# Patient Record
Sex: Male | Born: 1955 | Race: White | Hispanic: No | Marital: Married | Smoking: Former smoker
Health system: Southern US, Community
[De-identification: ages and names within clinical notes are randomized; demographics above are authoritative.]

## PROBLEM LIST (undated history)

## (undated) DIAGNOSIS — I509 Heart failure, unspecified: Secondary | ICD-10-CM

## (undated) DIAGNOSIS — K219 Gastro-esophageal reflux disease without esophagitis: Secondary | ICD-10-CM

## (undated) DIAGNOSIS — I2510 Atherosclerotic heart disease of native coronary artery without angina pectoris: Secondary | ICD-10-CM

## (undated) DIAGNOSIS — G709 Myoneural disorder, unspecified: Secondary | ICD-10-CM

## (undated) DIAGNOSIS — R011 Cardiac murmur, unspecified: Secondary | ICD-10-CM

## (undated) DIAGNOSIS — G4730 Sleep apnea, unspecified: Secondary | ICD-10-CM

## (undated) DIAGNOSIS — I10 Essential (primary) hypertension: Secondary | ICD-10-CM

## (undated) HISTORY — PX: TONSILLECTOMY: SUR1361

---

## 2015-03-08 ENCOUNTER — Emergency Department (HOSPITAL_COMMUNITY): Payer: BLUE CROSS/BLUE SHIELD

## 2015-03-08 ENCOUNTER — Inpatient Hospital Stay (HOSPITAL_COMMUNITY)
Admission: EM | Admit: 2015-03-08 | Discharge: 2015-03-11 | DRG: 247 | Disposition: A | Discharge status: Home / Self Care | Payer: BLUE CROSS/BLUE SHIELD | Attending: Student in an Organized Health Care Education/Training Program | Admitting: Student in an Organized Health Care Education/Training Program

## 2015-03-08 ENCOUNTER — Encounter (HOSPITAL_COMMUNITY): Payer: Self-pay | Admitting: Nurse Practitioner

## 2015-03-08 DIAGNOSIS — I5021 Acute systolic (congestive) heart failure: Secondary | ICD-10-CM | POA: Diagnosis present

## 2015-03-08 DIAGNOSIS — Z87891 Personal history of nicotine dependence: Secondary | ICD-10-CM

## 2015-03-08 DIAGNOSIS — I5041 Acute combined systolic (congestive) and diastolic (congestive) heart failure: Principal | ICD-10-CM | POA: Diagnosis present

## 2015-03-08 DIAGNOSIS — I25110 Atherosclerotic heart disease of native coronary artery with unstable angina pectoris: Secondary | ICD-10-CM | POA: Diagnosis present

## 2015-03-08 DIAGNOSIS — I509 Heart failure, unspecified: Secondary | ICD-10-CM

## 2015-03-08 DIAGNOSIS — E785 Hyperlipidemia, unspecified: Secondary | ICD-10-CM | POA: Diagnosis present

## 2015-03-08 DIAGNOSIS — I10 Essential (primary) hypertension: Secondary | ICD-10-CM | POA: Diagnosis present

## 2015-03-08 DIAGNOSIS — R0602 Shortness of breath: Secondary | ICD-10-CM | POA: Diagnosis present

## 2015-03-08 DIAGNOSIS — Z955 Presence of coronary angioplasty implant and graft: Secondary | ICD-10-CM | POA: Insufficient documentation

## 2015-03-08 DIAGNOSIS — Z79899 Other long term (current) drug therapy: Secondary | ICD-10-CM

## 2015-03-08 DIAGNOSIS — Z6834 Body mass index (BMI) 34.0-34.9, adult: Secondary | ICD-10-CM

## 2015-03-08 DIAGNOSIS — E8770 Fluid overload, unspecified: Secondary | ICD-10-CM

## 2015-03-08 DIAGNOSIS — Z8249 Family history of ischemic heart disease and other diseases of the circulatory system: Secondary | ICD-10-CM

## 2015-03-08 DIAGNOSIS — I255 Ischemic cardiomyopathy: Secondary | ICD-10-CM | POA: Diagnosis present

## 2015-03-08 DIAGNOSIS — I493 Ventricular premature depolarization: Secondary | ICD-10-CM | POA: Diagnosis not present

## 2015-03-08 DIAGNOSIS — I2510 Atherosclerotic heart disease of native coronary artery without angina pectoris: Secondary | ICD-10-CM | POA: Diagnosis present

## 2015-03-08 DIAGNOSIS — Z7982 Long term (current) use of aspirin: Secondary | ICD-10-CM

## 2015-03-08 DIAGNOSIS — I200 Unstable angina: Secondary | ICD-10-CM | POA: Diagnosis present

## 2015-03-08 HISTORY — DX: Morbid (severe) obesity due to excess calories: E66.01

## 2015-03-08 HISTORY — DX: Essential (primary) hypertension: I10

## 2015-03-08 LAB — BASIC METABOLIC PANEL
Anion gap: 10 (ref 5–15)
BUN: 15 mg/dL (ref 6–20)
CALCIUM: 9.6 mg/dL (ref 8.9–10.3)
CO2: 26 mmol/L (ref 22–32)
Chloride: 103 mmol/L (ref 101–111)
Creatinine, Ser: 1.04 mg/dL (ref 0.61–1.24)
GFR calc Af Amer: >60 mL/min (ref >60)
GLUCOSE: 147 mg/dL — AB (ref 65–99)
Potassium: 3.6 mmol/L (ref 3.5–5.1)
Sodium: 139 mmol/L (ref 135–145)

## 2015-03-08 LAB — CBC
HEMATOCRIT: 44 % (ref 39.0–52.0)
Hemoglobin: 15.3 g/dL (ref 13.0–17.0)
MCH: 31.6 pg (ref 26.0–34.0)
MCHC: 34.8 g/dL (ref 30.0–36.0)
MCV: 90.9 fL (ref 78.0–100.0)
Platelets: 252 10*3/uL (ref 150–400)
RBC: 4.84 MIL/uL (ref 4.22–5.81)
RDW: 12.2 % (ref 11.5–15.5)
WBC: 7 10*3/uL (ref 4.0–10.5)

## 2015-03-08 LAB — BRAIN NATRIURETIC PEPTIDE: B Natriuretic Peptide: 938.2 pg/mL — ABNORMAL HIGH (ref 0.0–100.0)

## 2015-03-08 LAB — TROPONIN I: Troponin I: 0.04 ng/mL — ABNORMAL HIGH (ref <0.031)

## 2015-03-08 LAB — I-STAT TROPONIN, ED: TROPONIN I, POC: 0.03 ng/mL (ref 0.00–0.08)

## 2015-03-08 MED ORDER — ENOXAPARIN SODIUM 40 MG/0.4ML ~~LOC~~ SOLN
40.0000 mg | SUBCUTANEOUS | Status: DC
  Administered 2015-03-09 – 2015-03-11 (×3): 40 mg via SUBCUTANEOUS
  Filled 2015-03-08 (×4): qty 0.4

## 2015-03-08 MED ORDER — ASPIRIN EC 81 MG PO TBEC
81.0000 mg | DELAYED_RELEASE_TABLET | Freq: Every day | ORAL | Status: DC
  Administered 2015-03-09: 81 mg via ORAL
  Filled 2015-03-08 (×2): qty 1

## 2015-03-08 MED ORDER — FUROSEMIDE 10 MG/ML IJ SOLN
40.0000 mg | Freq: Once | INTRAMUSCULAR | Status: AC
  Administered 2015-03-08: 40 mg via INTRAVENOUS
  Filled 2015-03-08: qty 4

## 2015-03-08 MED ORDER — SODIUM CHLORIDE 0.9 % IJ SOLN
3.0000 mL | Freq: Two times a day (BID) | INTRAMUSCULAR | Status: DC
  Administered 2015-03-08 – 2015-03-10 (×3): 3 mL via INTRAVENOUS

## 2015-03-08 NOTE — H&P (Signed)
Date: 03/08/2015               Patient Name:  William Contreras MRN: [id number]  DOB: 1956/01/30 Age / Sex: 59 y.o., male   PCP: No Pcp Per Patient         Medical Service: Internal Medicine Teaching Service         Attending Physician: Dr. Axel Filler, MD    First Contact: Dr. Burgess Estelle Pager: [phone]  Second Contact: Dr. Joni Reining Pager: [phone]       After Hours (After 5p/  First Contact Pager: [phone]  weekends / holidays): Second Contact Pager: [phone]   Chief Complaint: shortness of breath  History of Present Illness: Mr. William Contreras is a 59 y.o. caucasian male with no significant past medical history who presents to the emergency department for evaluation of progressively worsening shortness of breath.  Patient was seen at an urgent care today for this complaint and was sent to this ED when his EKG revealed an irregular rhythm.  About a year and a half ago, Mr. Hyser was treated as an outpatient for presumed pneumonia/bronhchitis with antibiotics and steroids.  Since about that time he has experienced shortness of breath aggravated by exertion and relieved with rest.  States that the SOB has been progressively worsening over this period of time and he finds it difficult to catch his breath with such activities as mowing the lawn.  Over the past few weeks, he is also experiencing symptoms while lying down in bed and this is relieved by sitting up.  States that he can hear a rumbling in his chest while lying down.  States that he does not sleep with extra pillows.  Also endorses a dry cough that is aggravated by lying down.  He denies any chest pain with his symptomology as well as any palpitations.  Does not endorse any recent weight gain or excess swelling in his extremities.  Family history is positive for his mother having had a heart attack in her 73s.    While in the ED, patient had a negative troponin, BNP of 938, and 2-view chest xray that was read as  having mild pulmonary edema and small bilateral pleural effusions.  He was also given 40mg  IV Lasix.  Meds: No current facility-administered medications for this encounter.   Current Outpatient Prescriptions  Medication Sig Dispense Refill  . aspirin EC 81 MG tablet Take 81 mg by mouth daily.    . calcium citrate (CALCITRATE - DOSED IN MG ELEMENTAL CALCIUM) 950 MG tablet Take 200 mg of elemental calcium by mouth daily.    . Flaxseed, Linseed, (FLAX SEED OIL PO) Take 1 tablet by mouth daily.    . fluticasone (FLONASE) 50 MCG/ACT nasal spray Place 2 sprays into both nostrils daily as needed for allergies or rhinitis.    Marland Kitchen Hawthorne Berry 550 MG CAPS Take 550 mg by mouth daily.    . Magnesium 250 MG TABS Take 1 tablet by mouth at bedtime.    . Multiple Vitamin (MULTIVITAMIN) tablet Take 1 tablet by mouth daily.    . Omega-3 Fatty Acids (FISH OIL) 1000 MG CPDR Take 1,000 mg by mouth daily.      Allergies: Allergies as of 03/08/2015 - Review Complete 03/08/2015  Allergen Reaction Noted  . Penicillins Other (See Comments) 03/08/2015   History reviewed. No pertinent past medical history. Past Surgical History  Procedure Laterality Date  . Tonsillectomy     History reviewed. No pertinent  family history. Social History   Social History  . Marital Status: Married    Spouse Name: N/A  . Number of Children: N/A  . Years of Education: N/A   Occupational History  . Not on file.   Social History Main Topics  . Smoking status: Former Research scientist (life sciences)  . Smokeless tobacco: Not on file  . Alcohol Use: No  . Drug Use: No  . Sexual Activity: Not on file   Other Topics Concern  . Not on file   Social History Narrative  . No narrative on file    Review of Systems: Review of Systems  Constitutional: Negative for fever and chills.  Respiratory: Positive for cough and shortness of breath. Negative for hemoptysis, sputum production and wheezing.   Cardiovascular: Positive for orthopnea and PND.  Negative for chest pain, palpitations, claudication and leg swelling.  Gastrointestinal: Negative for heartburn, nausea, vomiting, abdominal pain, diarrhea and constipation.  Genitourinary: Negative for dysuria and frequency.  Musculoskeletal: Negative for back pain and neck pain.  Neurological: Negative for dizziness, speech change and focal weakness.     Physical Exam: Blood pressure 132/75, pulse 86, temperature 98.3 F (36.8 C), temperature source Oral, resp. rate 20, height 5\' 10"  (1.778 m), weight 238 lb (107.956 kg), SpO2 91 %.  Physical Exam  Constitutional: He appears well-developed and well-nourished. No distress.  HENT:  Head: Normocephalic and atraumatic.  Eyes: EOM are normal.  Neck: Normal range of motion. No JVD present.  Cardiovascular: Normal rate, regular rhythm, normal heart sounds and intact distal pulses.   Pulmonary/Chest: Effort normal. No respiratory distress. He has no wheezes.  + bibasilar crackles  Abdominal: Soft. Bowel sounds are normal. He exhibits no distension. There is no tenderness.  Musculoskeletal: He exhibits edema.  Trace pitting edema b/l lower extremities   Neurological: He is alert. No cranial nerve deficit.  Skin: Skin is warm and dry. He is not diaphoretic.  Psychiatric: He has a normal mood and affect. His behavior is normal.    Lab results: Basic Metabolic Panel:  Recent Labs  03/08/15 1640  NA 139  K 3.6  CL 103  CO2 26  GLUCOSE 147*  BUN 15  CREATININE 1.04  CALCIUM 9.6   CBC:  Recent Labs  03/08/15 1640  WBC 7.0  HGB 15.3  HCT 44.0  MCV 90.9  PLT 252    Imaging results:  Dg Chest 2 View  03/08/2015   CLINICAL DATA:  Shortness of breath with difficulty sleeping over the past few weeks. Irregular heart rhythm. Ex-smoker. Initial encounter.  EXAM: CHEST  2 VIEW  COMPARISON:  None.  FINDINGS: The heart is mildly enlarged. The mediastinal contours are normal. There is vascular congestion with diffuse interstitial and  fissural prominence. There are small bilateral pleural effusions. There is a small metallic foreign density within the presternal soft tissues. Mild thoracic spine degenerative changes are noted. No acute osseous findings seen.  IMPRESSION: Congestive heart failure with mild pulmonary edema and small bilateral pleural effusions.   Electronically Signed   By: Richardean Sale M.D.   On: 03/08/2015 19:28    Other results: EKG: Normal sinus rhythm w/ left axis deviation.  Left ventricular hypertrophy with QRS widening and repolarization abnormality  Assessment & Plan by Problem: Active Problems:   CHF (congestive heart failure)   Shortness of breath  Shortness of breath -likely previously undiagnosed CHF given NYHA class II symptoms of worsening DOE with radiographic findings of pulmonary edema and bilateral effusions with  BNP of 938.  Patient also endorses mild orthopnea and PND.  Do not suspect infectious cause of SOB with patient being afebrile, no leukocytosis, no sputum production, and chronic nature of symptomology.  Also do not suspect pulmonary embolus as source of SOB with no signs/symptoms of DVT and Wells Criteria score for PE of 0. -Lasix 40mg  IV x 1 now -cycle troponins  -ECHO -repeat EKG in morning -BMP in the morning -monitor blood pressure -check Hgb A1c, lipid profile, TSH -continue Aspirin 81mg  daily -I/O's, daily weights -admit to telemetry  Diet: Heart healthy  DVT PPX: Lovenox  Code: Full  Dispo: Disposition is deferred at this time, awaiting improvement of current medical problems. Anticipated discharge in approximately 1-2 day(s).   The patient does not have a current PCP (No Pcp Per Patient) and does need an [facility] hospital follow-up appointment after discharge.  The patient does not have transportation limitations that hinder transportation to clinic appointments.  Signed: Jule Ser, DO 03/08/2015, 10:01 PM

## 2015-03-08 NOTE — ED Notes (Addendum)
He went to urgent care for SOB and difficulty sleeping over past few weeks, [facility] sent him for irregular heart rhythm on EKG. He denies pain. He is A&Ox4, resp e/u

## 2015-03-08 NOTE — ED Notes (Signed)
Report attempted 

## 2015-03-08 NOTE — ED Provider Notes (Signed)
CSN: [id number]     Arrival date & time 03/08/15  1622 History   First MD Initiated Contact with Patient 03/08/15 1755     Chief Complaint  Patient presents with  . Irregular Heart Beat     (Consider location/radiation/quality/duration/timing/severity/associated sxs/prior Treatment) HPI William Contreras is a 59 yo male who presents today with SOB and difficulty sleeping for the past couple of weeks. He was first seen at urgent care, an EKG was performed, it showed an irregular rhythm so he was sent here. He states his SOB has been present for a year and a half, ever since he was diagnosed with pneumonia. He admits to DOE that goes away with rest. He denies CP but does say he feels chest tightness occasionally. He works outdoors in the heat and says that this past week has been very hard for him. He admits to fatigue today.   Patient states that for the past couple of weeks he has been lying down at night and has felt/ heard rumbling/ wheezing in his epigastric region. He says he becomes more short of breath and this is only relieved by sitting up. He admits to occasional cough with these episodes. He sleeps on one pillow. He denies claudication, edema. He denies fever, chills, palpitations, N/V/D.    History reviewed. No pertinent past medical history. Past Surgical History  Procedure Laterality Date  . Tonsillectomy     History reviewed. No pertinent family history. Social History  Substance Use Topics  . Smoking status: Former Research scientist (life sciences)  . Smokeless tobacco: None  . Alcohol Use: No    Review of Systems   Allergies  Penicillins  Home Medications   Prior to Admission medications   Not on File   BP 138/99 mmHg  Pulse 44  Temp(Src) 98.3 F (36.8 C) (Oral)  Resp 16  Ht 5\' 10"  (1.778 m)  Wt 238 lb (107.956 kg)  BMI 34.15 kg/m2  SpO2 96% Physical Exam  Constitutional: He is oriented to person, place, and time. He appears well-developed and well-nourished. No distress.   HENT:  Head: Normocephalic and atraumatic.  Mouth/Throat: Oropharynx is clear and moist.  Eyes: Pupils are equal, round, and reactive to light.  Neck: Normal range of motion. Neck supple.  Cardiovascular: Normal rate, regular rhythm and normal heart sounds.  Exam reveals no gallop and no friction rub.   No murmur heard. Pulmonary/Chest: Effort normal. No respiratory distress. He has rales.  Abdominal: Soft. He exhibits no distension.  Neurological: He is alert and oriented to person, place, and time. He exhibits normal muscle tone. Coordination normal.  Skin: Skin is warm and dry. No erythema.  Psychiatric: He has a normal mood and affect. His behavior is normal.  Nursing note and vitals reviewed.   ED Course  Procedures (including critical care time) Labs Review Labs Reviewed  BASIC METABOLIC PANEL - Abnormal; Notable for the following:    Glucose, Bld 147 (*)    All other components within normal limits  BRAIN NATRIURETIC PEPTIDE - Abnormal; Notable for the following:    B Natriuretic Peptide 938.2 (*)    All other components within normal limits  BASIC METABOLIC PANEL - Abnormal; Notable for the following:    Glucose, Bld 109 (*)    All other components within normal limits  TROPONIN I - Abnormal; Notable for the following:    Troponin I 0.04 (*)    All other components within normal limits  TROPONIN I - Abnormal; Notable for the following:  Troponin I 0.04 (*)    All other components within normal limits  IRON AND TIBC - Abnormal; Notable for the following:    Saturation Ratios 13 (*)    All other components within normal limits  COMPREHENSIVE METABOLIC PANEL - Abnormal; Notable for the following:    Potassium 3.1 (*)    Chloride 100 (*)    Glucose, Bld 109 (*)    ALT 15 (*)    All other components within normal limits  LIPID PANEL - Abnormal; Notable for the following:    Cholesterol 282 (*)    Triglycerides 234 (*)    HDL 35 (*)    VLDL 47 (*)    LDL  Cholesterol 200 (*)    All other components within normal limits  BASIC METABOLIC PANEL - Abnormal; Notable for the following:    Glucose, Bld 101 (*)    Calcium 8.7 (*)    All other components within normal limits  POCT I-STAT 3, VENOUS BLOOD GAS (G3P V) - Abnormal; Notable for the following:    pH, Ven 7.388 (*)    pCO2, Ven 43.6 (*)    Bicarbonate 26.3 (*)    All other components within normal limits  POCT I-STAT 3, VENOUS BLOOD GAS (G3P V) - Abnormal; Notable for the following:    pH, Ven 7.406 (*)    pCO2, Ven 43.5 (*)    Bicarbonate 27.3 (*)    All other components within normal limits  CBC  TSH  HEMOGLOBIN A1C  HIV ANTIBODY (ROUTINE TESTING)  FERRITIN  DRUGS OF ABUSE SCREEN W ALC, ROUTINE URINE  PROTIME-INR  CBC  I-STAT TROPOININ, ED  POCT ACTIVATED CLOTTING TIME    Imaging Review No results found. I have personally reviewed and evaluated these images and lab results as part of my medical decision-making.   EKG Interpretation   Date/Time:  Sunday March 08 2015 16:28:06 EDT Ventricular Rate:  85 PR Interval:  180 QRS Duration: 128 QT Interval:  388 QTC Calculation: 461 R Axis:   -30 Text Interpretation:  Normal sinus rhythm Left axis deviation Left  ventricular hypertrophy with QRS widening and repolarization abnormality  Cannot rule out Septal infarct , age undetermined Abnormal ECG ED  PHYSICIAN INTERPRETATION AVAILABLE IN [facility] Confirmed by TEST,  Record ([id number]) on 03/09/2015 7:07:08 AM       I am concerned about the patient having a cardiac etiology for his chest pain and the fact that he has CHF.  Patient will be admitted to the hospital for further evaluation and care  Dalia Heading, PA-C 03/13/15 [place], MD 03/16/15 [phone]

## 2015-03-08 NOTE — Progress Notes (Signed)
   03/08/15 2241  Vitals  Temp 97.6 F (36.4 C)  Temp Source Oral  BP 107/74 mmHg  BP Location Left Arm  BP Method Automatic  Patient Position (if appropriate) Lying  Pulse Rate 81  Resp 19  Oxygen Therapy  SpO2 91 %  O2 Device Room Air  Height and Weight  Height 5\' 10"  (1.778 m)  Weight 109.408 kg (241 lb 3.2 oz)  Type of Scale Used Standing  Type of Weight Actual  BSA (Calculated - sq m) 2.32 sq meters  BMI (Calculated) 34.7  Weight in (lb) to have BMI = 25 173.9  Admitted pt to rm 3E22 from ED, pt alert and oriented, denied pain at this time, oriented to room, call bell placed within reach, orders carried out. Will monitor.

## 2015-03-09 ENCOUNTER — Encounter (HOSPITAL_COMMUNITY): Payer: Self-pay | Admitting: Nurse Practitioner

## 2015-03-09 ENCOUNTER — Observation Stay (HOSPITAL_COMMUNITY): Payer: BLUE CROSS/BLUE SHIELD

## 2015-03-09 DIAGNOSIS — I509 Heart failure, unspecified: Secondary | ICD-10-CM | POA: Diagnosis not present

## 2015-03-09 DIAGNOSIS — I420 Dilated cardiomyopathy: Secondary | ICD-10-CM

## 2015-03-09 DIAGNOSIS — I10 Essential (primary) hypertension: Secondary | ICD-10-CM | POA: Diagnosis not present

## 2015-03-09 DIAGNOSIS — E785 Hyperlipidemia, unspecified: Secondary | ICD-10-CM | POA: Diagnosis not present

## 2015-03-09 DIAGNOSIS — R0600 Dyspnea, unspecified: Secondary | ICD-10-CM | POA: Diagnosis not present

## 2015-03-09 DIAGNOSIS — I248 Other forms of acute ischemic heart disease: Secondary | ICD-10-CM

## 2015-03-09 DIAGNOSIS — I5021 Acute systolic (congestive) heart failure: Secondary | ICD-10-CM

## 2015-03-09 DIAGNOSIS — R0602 Shortness of breath: Secondary | ICD-10-CM | POA: Diagnosis not present

## 2015-03-09 LAB — BASIC METABOLIC PANEL
Anion gap: 9 (ref 5–15)
BUN: 15 mg/dL (ref 6–20)
CALCIUM: 9 mg/dL (ref 8.9–10.3)
CHLORIDE: 102 mmol/L (ref 101–111)
CO2: 27 mmol/L (ref 22–32)
CREATININE: 1.09 mg/dL (ref 0.61–1.24)
GFR calc Af Amer: >60 mL/min (ref >60)
GFR calc non Af Amer: >60 mL/min (ref >60)
Glucose, Bld: 109 mg/dL — ABNORMAL HIGH (ref 65–99)
Potassium: 3.6 mmol/L (ref 3.5–5.1)
Sodium: 138 mmol/L (ref 135–145)

## 2015-03-09 LAB — TSH: TSH: 0.941 u[IU]/mL (ref 0.350–4.500)

## 2015-03-09 LAB — FERRITIN: Ferritin: 55 ng/mL (ref 24–336)

## 2015-03-09 LAB — TROPONIN I: Troponin I: 0.04 ng/mL — ABNORMAL HIGH (ref <0.031)

## 2015-03-09 LAB — IRON AND TIBC
IRON: 49 ug/dL (ref 45–182)
SATURATION RATIOS: 13 % — AB (ref 17.9–39.5)
TIBC: 389 ug/dL (ref 250–450)
UIBC: 340 ug/dL

## 2015-03-09 MED ORDER — SODIUM CHLORIDE 0.9 % IJ SOLN: 3.0000 mL | INTRAMUSCULAR | Status: DC | PRN

## 2015-03-09 MED ORDER — FUROSEMIDE 10 MG/ML IJ SOLN
40.0000 mg | Freq: Once | INTRAMUSCULAR | Status: AC
  Administered 2015-03-09: 40 mg via INTRAVENOUS
  Filled 2015-03-09: qty 4

## 2015-03-09 MED ORDER — SODIUM CHLORIDE 0.9 % IV SOLN: 250.0000 mL | INTRAVENOUS | Status: DC | PRN

## 2015-03-09 MED ORDER — FUROSEMIDE 10 MG/ML IJ SOLN
40.0000 mg | Freq: Once | INTRAMUSCULAR | Status: DC
Start: 2015-03-09 — End: 2015-03-09

## 2015-03-09 MED ORDER — ATORVASTATIN CALCIUM 80 MG PO TABS
80.0000 mg | ORAL_TABLET | Freq: Every day | ORAL | Status: DC
  Administered 2015-03-09 – 2015-03-11 (×3): 80 mg via ORAL
  Filled 2015-03-09 (×3): qty 1

## 2015-03-09 MED ORDER — SODIUM CHLORIDE 0.9 % IV SOLN
INTRAVENOUS | Status: DC
  Administered 2015-03-10: 10:00:00 via INTRAVENOUS

## 2015-03-09 MED ORDER — LISINOPRIL 5 MG PO TABS
2.5000 mg | ORAL_TABLET | Freq: Every day | ORAL | Status: DC
  Administered 2015-03-09 – 2015-03-11 (×3): 2.5 mg via ORAL
  Filled 2015-03-09 (×4): qty 1

## 2015-03-09 MED ORDER — ASPIRIN 81 MG PO CHEW
81.0000 mg | CHEWABLE_TABLET | ORAL | Status: AC
  Administered 2015-03-10: 81 mg via ORAL
  Filled 2015-03-09: qty 1

## 2015-03-09 MED ORDER — CARVEDILOL 3.125 MG PO TABS
3.1250 mg | ORAL_TABLET | Freq: Two times a day (BID) | ORAL | Status: DC
  Administered 2015-03-09 – 2015-03-10 (×3): 3.125 mg via ORAL
  Filled 2015-03-09 (×3): qty 1

## 2015-03-09 MED ORDER — ZOLPIDEM TARTRATE 5 MG PO TABS
5.0000 mg | ORAL_TABLET | Freq: Once | ORAL | Status: AC
  Administered 2015-03-09: 5 mg via ORAL
  Filled 2015-03-09: qty 1

## 2015-03-09 MED ORDER — SODIUM CHLORIDE 0.9 % IJ SOLN
3.0000 mL | Freq: Two times a day (BID) | INTRAMUSCULAR | Status: DC
  Administered 2015-03-09 – 2015-03-10 (×2): 3 mL via INTRAVENOUS

## 2015-03-09 NOTE — Progress Notes (Signed)
Pt is Stable, no any signs of SOB and distress noted, Inj.Lasix 40mg  provided as per ordered, Echo done today, will continue to monitor the patient.

## 2015-03-09 NOTE — Progress Notes (Signed)
   Subjective: No acute events overnight Patient put out abt 1.5 L diuresed well with lasix Echo today  Objective:  Vitals reviewed Gen: a&o Cv: rrr, no mrg Resp: bibasilar crackles,  Abd,: soft nt nd Extremities: trace edema, has bruised on right lower extremity  Labs, imaging, micro and cultures are reviewed, and the pertinent ones are discussed either in Subjective or in the Assessment and Plan  A/P 59 yo with no pmh came in with 1 month history of DOE and SOB, likely volume overload due to undiagnosed HF  CHF now with volume overload- BNP of 938 -Lasix 40 mg iv bid, put out about 1.5 L -continue aspirin 81 mg -I&O and daily weights -echo shows EF of 30-35% with dilation of atria and diffuse hypokinesis.  -consulted cardiology, will be going for cath to rule out ischemic CAD, rest per their management- they recommended addign low dose beta blocker and acei.  -obtaiend HIV, ferritin, iron, lipid panel and a1c        LOS: 1 day   Burgess Estelle, MD 03/09/2015, 4:50 PM

## 2015-03-09 NOTE — Consult Note (Signed)
CARDIOLOGY CONSULT NOTE   Patient ID: William Contreras MRN: [id number], DOB/AGE: 07-14-1955   Admit date: 03/08/2015 Date of Consult: 03/09/2015   Primary Physician: No PCP Per Patient Primary Cardiologist: new - seen by Liane Comber, MD   Pt. Profile  59 year old male with a history of untreated hypertension and obesity who was admitted on August 28 with a one-month history of progressive dyspnea on exertion and exertional chest pressure and has now been found to have an EF of 30-35% with diffuse hypokinesis.  Problem List  Past Medical History  Diagnosis Date  . Essential hypertension   . Morbid obesity     Past Surgical History  Procedure Laterality Date  . Tonsillectomy       Allergies  Allergies  Allergen Reactions  . Penicillins Other (See Comments)    Lightheadness    HPI   59 year old male without prior cardiac history. He does have a history of hypertension and says that he was on medication at one point but has not been on medication in years. He is currently only taking over-the-counter herbal supplements. He was in his usual state of health until approximately one month ago when he began to experience progressive exertional substernal chest tightness associated with dyspnea. He sometimes notices radiation to shoulders with numbness down his arms. This would often occur at work where performing fairly heavy exertion. Symptoms would last less than 5 minutes and resolve with rest. Within the past 2 weeks, he began to notice symptoms occurring with less and less provocation. Further, he also noticed mild lower extremity edema as well as increasing abdominal girth and early satiety. On the morning of August 28, he developed orthopnea and after reading on the Internet, he presented to an urgent care and was subsequently referred to the emergency department. Here, chest x-ray showed heart failure with mild pulmonary edema and small bilateral pleural effusions. EKG showed no  acute ST or T changes but was suggestive of LVH. BNP was elevated at 938.2. Troponins were mildly elevated at 0.04. TSH was normal. He was admitted and treated with IV Lasix with good response. He is -1.4 L this admission. He says that his baseline weight is somewhere between 235 and 240 and he is currently measured at 241. Symptomatically, he is much improved. An echocardiogram was performed this morning and has shown an EF of 30-35% with diffuse hypokinesis.  Inpatient Medications  . aspirin EC  81 mg Oral Daily  . enoxaparin (LOVENOX) injection  40 mg Subcutaneous Q24H  . furosemide  40 mg Intravenous Once  . sodium chloride  3 mL Intravenous Q12H    Family History Family History  Problem Relation Age of Onset  . Other Father     died young in [place].  Marland Kitchen Heart attack Mother     alive @ 30.  . Other Brother     A & W  . Other Sister     A & W     Social History Social History   Social History  . Marital Status: Married    Spouse Name: N/A  . Number of Children: N/A  . Years of Education: N/A   Occupational History  . Not on file.   Social History Main Topics  . Smoking status: Former Smoker -- 1.00 packs/day for 15 years    Types: Cigarettes  . Smokeless tobacco: Not on file     Comment: quit smoking ~ 20 yrs ago.  . Alcohol Use: No  Comment: prev drank - none in ~ 25 yrs.  . Drug Use: No     Comment: prev smoked MJ, none in 25 yrs.  . Sexual Activity: Yes   Other Topics Concern  . Not on file   Social History Narrative   Pt lives in [place] with his wife and son.  He has 2 step children and another child - all grown and out of the house.     Review of Systems  General:  No chills, fever, night sweats or weight changes.  Cardiovascular:  Positive exertional chest pressure/tightness as well as dyspnea. He has been noticing lower extreme edema as well as increasing early satiety and developed orthopnea on the day of admission. No palpitations, paroxysmal  nocturnal dyspnea. Dermatological: No rash, lesions/masses Respiratory: No cough, positive dyspnea Urologic: No hematuria, dysuria Abdominal:   No nausea, vomiting, diarrhea, bright red blood per rectum, melena, or hematemesis Neurologic:  No visual changes, wkns, changes in mental status. All other systems reviewed and are otherwise negative except as noted above.  Physical Exam  Blood pressure 118/86, pulse 84, temperature 98.4 F (36.9 C), temperature source Oral, resp. rate 20, height 5\' 10"  (1.778 m), weight 241 lb 2.8 oz (109.396 kg), SpO2 95 %.  General: Pleasant, NAD Psych: Normal affect. Neuro: Alert and oriented X 3. Moves all extremities spontaneously. HEENT: Normal  Neck: Supple without bruits. No significant JVD. Lungs:  Resp regular and unlabored, CTA. Heart: RRR no s3, s4, or murmurs. Abdomen: Slightly firm, protuberant, non-tender, BS + x 4.  Extremities: No clubbing, cyanosis or edema. DP/PT/Radials 2+ and equal bilaterally.  Labs   Recent Labs  03/08/15 2303 03/09/15 0430  TROPONINI 0.04* 0.04*   Lab Results  Component Value Date   WBC 7.0 03/08/2015   HGB 15.3 03/08/2015   HCT 44.0 03/08/2015   MCV 90.9 03/08/2015   PLT 252 03/08/2015    Recent Labs Lab 03/09/15 0430  NA 138  K 3.6  CL 102  CO2 27  BUN 15  CREATININE 1.09  CALCIUM 9.0  GLUCOSE 109*   Radiology/Studies  Dg Chest 2 View  03/08/2015   CLINICAL DATA:  Shortness of breath with difficulty sleeping over the past few weeks. Irregular heart rhythm. Ex-smoker. Initial encounter.  EXAM: CHEST  2 VIEW  COMPARISON:  None.  FINDINGS: The heart is mildly enlarged. The mediastinal contours are normal. There is vascular congestion with diffuse interstitial and fissural prominence. There are small bilateral pleural effusions. There is a small metallic foreign density within the presternal soft tissues. Mild thoracic spine degenerative changes are noted. No acute osseous findings seen.   IMPRESSION: Congestive heart failure with mild pulmonary edema and small bilateral pleural effusions.   Electronically Signed   By: Richardean Sale M.D.   On: 03/08/2015 19:28   2D Echocardiogram 8.29.2016  Study Conclusions  - Left ventricle: The cavity size was normal. There was mild   concentric hypertrophy. Systolic function was moderately to   severely reduced. The estimated ejection fraction was in the   range of 30% to 35%. Diffuse hypokinesis. - Aortic valve: A bicuspid morphology cannot be excluded; severely   thickened, moderately calcified leaflets. Valve mobility was   restricted. There was mild stenosis. However, visually aortic   valve restriction appears more significant. Non coronary cusp was   most mobile when compared to other leaflets. Doppler severity of   aortic stenosis may be underestimated secondary to decreased   ejection fraction (EF). There  was mild regurgitation. Valve area   (VTI): 1.86 cm^2. Valve area (Vmax): 1.54 cm^2. Valve area   (Vmean): 1.51 cm^2. - Aorta: The aorta was moderately dilated. Aortic root dimension:   51 mm (ED). - Mitral valve: There was moderate regurgitation. - Left atrium: The atrium was mildly dilated. _____________   ECG: Rsr, 75, lad, lae, lvh w/ repolarization abnormalities.    ASSESSMENT AND PLAN  1. Acute systolic congestive heart failure: Patient presents with a one-month history of progressive dyspnea on exertion and substernal chest pressure with subsequent development of lower extremity edema, increasing abdominal girth, early satiety, and orthopnea on the morning of admission. Chest x-ray admission showed CHF while BNP was elevated. He has been treated with 2 doses of IV Lasix with brisk response and symptomatically improvement. Volume status appears to be pretty good though his abdomen is slightly firm. His neck veins are somewhat difficult to gauge secondary to girth. EF has been found to be depressed at 30-35% with  diffuse hypokinesis on echocardiography today. We discussed the workup of systolic heart failure at length. His wife and sister-in-law were present in the room. We will plan on diagnostic right and left heart cardiac catheterization to rule out ischemic coronary artery disease.  The patient understands that risks include but are not limited to stroke (1 in 1000), death (1 in 29), kidney failure [usually temporary] (1 in 500), bleeding (1 in 200), allergic reaction [possibly serious] (1 in 200), and agrees to proceed.  I suspect he still has some volume on board and he just now received another dose of lasix 40 IV. Add low-dose beta blocker and ACE inhibitor therapy. Could consider switching to entresto at a later date if blood pressure is stable.  2. Unstable angina/elevated troponin: As above, patient has been experiencing exertional substernal chest tightness over the past month in association with dyspnea. Symptoms concerning for angina. Troponins are minimally elevated with a flat trend, likely suggesting demand ischemia. In light of new cardiomyopathy, plan on right left heart diagnostic catheterization tomorrow as above. Continue aspirin. Add low-dose beta blocker. Will add statin for now and check lipids and LFTs in the a.m.  3. Essential hypertension: Patient reports a history of hypertension that has been untreated. Pressures were mildly elevated on admission but have since improved. Follow.  Signed, Murray Hodgkins, NP 03/09/2015, 4:07 PM   The patient was seen, examined and discussed with Carmela Rima, NP and agree as above.  59 year old male who was admitted on 8/28 with 2 month of progressively worsening dyspnea, orthopnea, and was diagnosed with new acute systolic CHF and minimal troponin elevation (0.04 -->0.04), he diuresed well in iv Lasix with significant symptoms improvement, LVEF 30-35% with diffuse hypokinesis. We will schedule a right and left cath for tomorrow. Crea is normal.     Dorothy Spark 03/09/2015

## 2015-03-10 ENCOUNTER — Encounter (HOSPITAL_COMMUNITY)
Admission: EM | Disposition: A | Discharge status: Home / Self Care | Payer: BLUE CROSS/BLUE SHIELD | Source: Home / Self Care | Attending: Student in an Organized Health Care Education/Training Program

## 2015-03-10 DIAGNOSIS — E785 Hyperlipidemia, unspecified: Secondary | ICD-10-CM | POA: Diagnosis present

## 2015-03-10 DIAGNOSIS — I5021 Acute systolic (congestive) heart failure: Secondary | ICD-10-CM | POA: Diagnosis not present

## 2015-03-10 DIAGNOSIS — I5041 Acute combined systolic (congestive) and diastolic (congestive) heart failure: Secondary | ICD-10-CM | POA: Diagnosis present

## 2015-03-10 DIAGNOSIS — I200 Unstable angina: Secondary | ICD-10-CM | POA: Diagnosis present

## 2015-03-10 DIAGNOSIS — Z87891 Personal history of nicotine dependence: Secondary | ICD-10-CM | POA: Diagnosis not present

## 2015-03-10 DIAGNOSIS — I2510 Atherosclerotic heart disease of native coronary artery without angina pectoris: Secondary | ICD-10-CM | POA: Diagnosis present

## 2015-03-10 DIAGNOSIS — I25110 Atherosclerotic heart disease of native coronary artery with unstable angina pectoris: Secondary | ICD-10-CM

## 2015-03-10 DIAGNOSIS — I493 Ventricular premature depolarization: Secondary | ICD-10-CM | POA: Diagnosis not present

## 2015-03-10 DIAGNOSIS — I10 Essential (primary) hypertension: Secondary | ICD-10-CM | POA: Diagnosis present

## 2015-03-10 DIAGNOSIS — Z79899 Other long term (current) drug therapy: Secondary | ICD-10-CM | POA: Diagnosis not present

## 2015-03-10 DIAGNOSIS — R0602 Shortness of breath: Secondary | ICD-10-CM | POA: Diagnosis present

## 2015-03-10 DIAGNOSIS — Z6834 Body mass index (BMI) 34.0-34.9, adult: Secondary | ICD-10-CM | POA: Diagnosis not present

## 2015-03-10 DIAGNOSIS — Z7982 Long term (current) use of aspirin: Secondary | ICD-10-CM | POA: Diagnosis not present

## 2015-03-10 DIAGNOSIS — I255 Ischemic cardiomyopathy: Secondary | ICD-10-CM | POA: Diagnosis present

## 2015-03-10 DIAGNOSIS — Z8249 Family history of ischemic heart disease and other diseases of the circulatory system: Secondary | ICD-10-CM | POA: Diagnosis not present

## 2015-03-10 HISTORY — PX: CARDIAC CATHETERIZATION: SHX172

## 2015-03-10 LAB — POCT I-STAT 3, VENOUS BLOOD GAS (G3P V)
Acid-Base Excess: 1 mmol/L (ref 0.0–2.0)
Acid-Base Excess: 2 mmol/L (ref 0.0–2.0)
BICARBONATE: 26.3 meq/L — AB (ref 20.0–24.0)
Bicarbonate: 27.3 mEq/L — ABNORMAL HIGH (ref 20.0–24.0)
O2 SAT: 66 %
O2 Saturation: 66 %
PCO2 VEN: 43.5 mmHg — AB (ref 45.0–50.0)
PCO2 VEN: 43.6 mmHg — AB (ref 45.0–50.0)
PH VEN: 7.388 — AB (ref 7.250–7.300)
PO2 VEN: 34 mmHg (ref 30.0–45.0)
TCO2: 28 mmol/L (ref 0–100)
TCO2: 29 mmol/L (ref 0–100)
pH, Ven: 7.406 — ABNORMAL HIGH (ref 7.250–7.300)
pO2, Ven: 35 mmHg (ref 30.0–45.0)

## 2015-03-10 LAB — HEMOGLOBIN A1C
HEMOGLOBIN A1C: 5.6 % (ref 4.8–5.6)
Mean Plasma Glucose: 114 mg/dL

## 2015-03-10 LAB — COMPREHENSIVE METABOLIC PANEL
ALBUMIN: 3.5 g/dL (ref 3.5–5.0)
ALK PHOS: 41 U/L (ref 38–126)
ALT: 15 U/L — AB (ref 17–63)
AST: 15 U/L (ref 15–41)
Anion gap: 10 (ref 5–15)
BUN: 19 mg/dL (ref 6–20)
CALCIUM: 8.9 mg/dL (ref 8.9–10.3)
CO2: 26 mmol/L (ref 22–32)
CREATININE: 0.95 mg/dL (ref 0.61–1.24)
Chloride: 100 mmol/L — ABNORMAL LOW (ref 101–111)
GFR calc Af Amer: >60 mL/min (ref >60)
GFR calc non Af Amer: >60 mL/min (ref >60)
GLUCOSE: 109 mg/dL — AB (ref 65–99)
Potassium: 3.1 mmol/L — ABNORMAL LOW (ref 3.5–5.1)
Sodium: 136 mmol/L (ref 135–145)
Total Bilirubin: 1.1 mg/dL (ref 0.3–1.2)
Total Protein: 6.6 g/dL (ref 6.5–8.1)

## 2015-03-10 LAB — LIPID PANEL
CHOL/HDL RATIO: 8.1 ratio
CHOLESTEROL: 282 mg/dL — AB (ref 0–200)
HDL: 35 mg/dL — ABNORMAL LOW (ref >40)
LDL Cholesterol: 200 mg/dL — ABNORMAL HIGH (ref 0–99)
Triglycerides: 234 mg/dL — ABNORMAL HIGH (ref <150)
VLDL: 47 mg/dL — AB (ref 0–40)

## 2015-03-10 LAB — HIV ANTIBODY (ROUTINE TESTING W REFLEX): HIV Screen 4th Generation wRfx: NONREACTIVE

## 2015-03-10 LAB — URINE DRUGS OF ABUSE SCREEN W ALC, ROUTINE (REF LAB)
Amphetamines, Urine: NEGATIVE ng/mL
Barbiturate, Ur: NEGATIVE ng/mL
Benzodiazepine Quant, Ur: NEGATIVE ng/mL
CANNABINOID QUANT UR: NEGATIVE ng/mL
Cocaine (Metab.): NEGATIVE ng/mL
ETHANOL U, QUAN: NEGATIVE %
METHADONE SCREEN, URINE: NEGATIVE ng/mL
OPIATE QUANT UR: NEGATIVE ng/mL
PHENCYCLIDINE, UR: NEGATIVE ng/mL
PROPOXYPHENE, URINE: NEGATIVE ng/mL

## 2015-03-10 LAB — PROTIME-INR
INR: 1.07 (ref 0.00–1.49)
Prothrombin Time: 14.1 seconds (ref 11.6–15.2)

## 2015-03-10 LAB — POCT ACTIVATED CLOTTING TIME: ACTIVATED CLOTTING TIME: 466 s

## 2015-03-10 SURGERY — RIGHT/LEFT HEART CATH AND CORONARY ANGIOGRAPHY

## 2015-03-10 MED ORDER — HEPARIN SODIUM (PORCINE) 1000 UNIT/ML IJ SOLN
INTRAMUSCULAR | Status: DC | PRN
Start: 2015-03-10 — End: 2015-03-10
  Administered 2015-03-10: 5000 [IU] via INTRAVENOUS

## 2015-03-10 MED ORDER — TICAGRELOR 90 MG PO TABS
ORAL_TABLET | ORAL | Status: DC | PRN
  Administered 2015-03-10: 180 mg via ORAL

## 2015-03-10 MED ORDER — SODIUM CHLORIDE 0.9 % WEIGHT BASED INFUSION
1.0000 mL/kg/h | INTRAVENOUS | Status: AC
Start: 2015-03-10 — End: 2015-03-10
  Administered 2015-03-10: 1 mL/kg/h via INTRAVENOUS

## 2015-03-10 MED ORDER — IOHEXOL 350 MG/ML SOLN
INTRAVENOUS | Status: DC | PRN
  Administered 2015-03-10: 330 mL via INTRAVENOUS

## 2015-03-10 MED ORDER — VERAPAMIL HCL 2.5 MG/ML IV SOLN
INTRAVENOUS | Status: AC
  Filled 2015-03-10: qty 2

## 2015-03-10 MED ORDER — HEPARIN SODIUM (PORCINE) 1000 UNIT/ML IJ SOLN
INTRAMUSCULAR | Status: AC
  Filled 2015-03-10: qty 1

## 2015-03-10 MED ORDER — SODIUM CHLORIDE 0.9 % IV SOLN: 250.0000 mL | INTRAVENOUS | Status: DC | PRN

## 2015-03-10 MED ORDER — NITROGLYCERIN 1 MG/10 ML FOR IR/CATH LAB
INTRA_ARTERIAL | Status: AC
  Filled 2015-03-10: qty 10

## 2015-03-10 MED ORDER — SODIUM CHLORIDE 0.9 % IJ SOLN: 3.0000 mL | Freq: Two times a day (BID) | INTRAMUSCULAR | Status: DC

## 2015-03-10 MED ORDER — MIDAZOLAM HCL 2 MG/2ML IJ SOLN
INTRAMUSCULAR | Status: DC | PRN
Start: 2015-03-10 — End: 2015-03-10
  Administered 2015-03-10 (×2): 1 mg via INTRAVENOUS

## 2015-03-10 MED ORDER — BIVALIRUDIN BOLUS VIA INFUSION - CUPID
INTRAVENOUS | Status: DC | PRN
  Administered 2015-03-10: 79.875 mg via INTRAVENOUS

## 2015-03-10 MED ORDER — LIDOCAINE HCL (PF) 1 % IJ SOLN
INTRAMUSCULAR | Status: AC
  Filled 2015-03-10: qty 30

## 2015-03-10 MED ORDER — TICAGRELOR 90 MG PO TABS
ORAL_TABLET | ORAL | Status: AC
  Filled 2015-03-10: qty 1

## 2015-03-10 MED ORDER — FENTANYL CITRATE (PF) 100 MCG/2ML IJ SOLN
INTRAMUSCULAR | Status: DC | PRN
  Administered 2015-03-10 (×3): 25 ug via INTRAVENOUS

## 2015-03-10 MED ORDER — SODIUM CHLORIDE 0.9 % IV SOLN
1.7500 mg/kg/h | Freq: Once | INTRAVENOUS | Status: DC
  Filled 2015-03-10: qty 250

## 2015-03-10 MED ORDER — ASPIRIN 81 MG PO CHEW
81.0000 mg | CHEWABLE_TABLET | Freq: Every day | ORAL | Status: DC
  Administered 2015-03-11: 81 mg via ORAL
  Filled 2015-03-10: qty 1

## 2015-03-10 MED ORDER — ONDANSETRON HCL 4 MG/2ML IJ SOLN: 4.0000 mg | Freq: Four times a day (QID) | INTRAMUSCULAR | Status: DC | PRN

## 2015-03-10 MED ORDER — POTASSIUM CHLORIDE CRYS ER 20 MEQ PO TBCR: 40.0000 meq | EXTENDED_RELEASE_TABLET | Freq: Once | ORAL | Status: DC

## 2015-03-10 MED ORDER — TICAGRELOR 90 MG PO TABS
90.0000 mg | ORAL_TABLET | Freq: Two times a day (BID) | ORAL | Status: DC
  Administered 2015-03-11 (×2): 90 mg via ORAL
  Filled 2015-03-10 (×2): qty 1

## 2015-03-10 MED ORDER — BIVALIRUDIN 250 MG IV SOLR
INTRAVENOUS | Status: AC
  Filled 2015-03-10: qty 250

## 2015-03-10 MED ORDER — ACETAMINOPHEN 325 MG PO TABS: 650.0000 mg | ORAL_TABLET | ORAL | Status: DC | PRN

## 2015-03-10 MED ORDER — LIDOCAINE HCL (PF) 1 % IJ SOLN
INTRAMUSCULAR | Status: DC | PRN
  Administered 2015-03-10: 14:00:00

## 2015-03-10 MED ORDER — BIVALIRUDIN 250 MG IV SOLR
250.0000 mg | INTRAVENOUS | Status: DC | PRN
  Administered 2015-03-10: 1.75 mg/kg/h via INTRAVENOUS

## 2015-03-10 MED ORDER — POTASSIUM CHLORIDE CRYS ER 20 MEQ PO TBCR
40.0000 meq | EXTENDED_RELEASE_TABLET | ORAL | Status: AC
  Administered 2015-03-10: 40 meq via ORAL
  Filled 2015-03-10 (×2): qty 2

## 2015-03-10 MED ORDER — SODIUM CHLORIDE 0.9 % IJ SOLN: 3.0000 mL | INTRAMUSCULAR | Status: DC | PRN

## 2015-03-10 MED ORDER — FENTANYL CITRATE (PF) 100 MCG/2ML IJ SOLN
INTRAMUSCULAR | Status: AC
  Filled 2015-03-10: qty 4

## 2015-03-10 MED ORDER — MORPHINE SULFATE (PF) 2 MG/ML IV SOLN
2.0000 mg | INTRAVENOUS | Status: DC | PRN
  Administered 2015-03-10: 2 mg via INTRAVENOUS
  Filled 2015-03-10: qty 1

## 2015-03-10 MED ORDER — ENALAPRILAT 1.25 MG/ML IV SOLN
INTRAVENOUS | Status: AC
  Filled 2015-03-10: qty 1

## 2015-03-10 MED ORDER — HEPARIN (PORCINE) IN NACL 2-0.9 UNIT/ML-% IJ SOLN
INTRAMUSCULAR | Status: AC
  Filled 2015-03-10: qty 1000

## 2015-03-10 MED ORDER — MIDAZOLAM HCL 2 MG/2ML IJ SOLN
INTRAMUSCULAR | Status: AC
  Filled 2015-03-10: qty 4

## 2015-03-10 SURGICAL SUPPLY — 34 items
BALLN EMERGE MR 2.5X12 (BALLOONS) ×2
BALLN EMERGE MR 3.0X20 (BALLOONS) ×2
BALLN EMERGE MR PUSH 1.20X20 (BALLOONS) ×2 IMPLANT
BALLN ~~LOC~~ EMERGE MR 4.5X15 (BALLOONS) ×2
BALLOON EMERGE MR 2.5X12 (BALLOONS) ×1 IMPLANT
BALLOON EMERGE MR 3.0X20 (BALLOONS) ×1 IMPLANT
BALLOON ~~LOC~~ EMERGE MR 4.5X15 (BALLOONS) ×1 IMPLANT
CATH BALLN WEDGE 5F 110CM (CATHETERS) ×2 IMPLANT
CATH INFINITI 5 FR JL3.5 (CATHETERS) ×2 IMPLANT
CATH INFINITI 5FR ANG PIGTAIL (CATHETERS) ×2 IMPLANT
CATH INFINITI 5FR JL4 (CATHETERS) ×2 IMPLANT
CATH INFINITI JR4 5F (CATHETERS) ×2 IMPLANT
CATH VISTA GUIDE 6FR JR4 (CATHETERS) ×2 IMPLANT
CATH VISTA GUIDE 6FR XBLAD3.5 (CATHETERS) ×2 IMPLANT
DEVICE RAD COMP TR BAND LRG (VASCULAR PRODUCTS) ×2 IMPLANT
GLIDESHEATH SLEND SS 6F .021 (SHEATH) ×2 IMPLANT
GUIDELINER 6F (CATHETERS) ×2 IMPLANT
KIT ENCORE 26 ADVANTAGE (KITS) ×2 IMPLANT
KIT HEART LEFT (KITS) ×2 IMPLANT
KIT HEART RIGHT NAMIC (KITS) ×2 IMPLANT
PACK CARDIAC CATHETERIZATION (CUSTOM PROCEDURE TRAY) ×2 IMPLANT
SET INTRODUCER MICROPUNCT 5F (INTRODUCER) ×2 IMPLANT
SHEATH FAST CATH BRACH 5F 5CM (SHEATH) ×2 IMPLANT
STENT PROMUS PREM MR 2.75X20 (Permanent Stent) ×2 IMPLANT
STENT PROMUS PREM MR 4.0X38 (Permanent Stent) ×2 IMPLANT
SYR MEDRAD MARK V 150ML (SYRINGE) ×2 IMPLANT
TRANSDUCER W/STOPCOCK (MISCELLANEOUS) ×2 IMPLANT
TUBING CIL FLEX 10 FLL-RA (TUBING) ×2 IMPLANT
WIRE HI TORQ BMW 190CM (WIRE) ×2 IMPLANT
WIRE HI TORQ VERSACORE-J 145CM (WIRE) ×2 IMPLANT
WIRE LUGE 182CM (WIRE) ×2 IMPLANT
WIRE MICROINTRODUCER 60CM (WIRE) ×2 IMPLANT
WIRE PT2 MS 185 (WIRE) ×2 IMPLANT
WIRE SAFE-T 1.5MM-J .035X260CM (WIRE) ×2 IMPLANT

## 2015-03-10 NOTE — H&P (View-Only) (Signed)
 CARDIOLOGY CONSULT NOTE   Patient ID: William Contreras MRN: [id number], DOB/AGE: 10/07/1955   Admit date: 03/08/2015 Date of Consult: 03/09/2015   Primary Physician: No PCP Per Patient Primary Cardiologist: new - seen by K. Aastha Dayley, MD   Pt. Profile  59-year-old male with a history of untreated hypertension and obesity who was admitted on August 28 with a one-month history of progressive dyspnea on exertion and exertional chest pressure and has now been found to have an EF of 30-35% with diffuse hypokinesis.  Problem List  Past Medical History  Diagnosis Date  . Essential hypertension   . Morbid obesity     Past Surgical History  Procedure Laterality Date  . Tonsillectomy       Allergies  Allergies  Allergen Reactions  . Penicillins Other (See Comments)    Lightheadness    HPI   59-year-old male without prior cardiac history. He does have a history of hypertension and says that he was on medication at one point but has not been on medication in years. He is currently only taking over-the-counter herbal supplements. He was in his usual state of health until approximately one month ago when he began to experience progressive exertional substernal chest tightness associated with dyspnea. He sometimes notices radiation to shoulders with numbness down his arms. This would often occur at work where performing fairly heavy exertion. Symptoms would last less than 5 minutes and resolve with rest. Within the past 2 weeks, he began to notice symptoms occurring with less and less provocation. Further, he also noticed mild lower extremity edema as well as increasing abdominal girth and early satiety. On the morning of August 28, he developed orthopnea and after reading on the Internet, he presented to an urgent care and was subsequently referred to the emergency department. Here, chest x-ray showed heart failure with mild pulmonary edema and small bilateral pleural effusions. EKG showed no  acute ST or T changes but was suggestive of LVH. BNP was elevated at 938.2. Troponins were mildly elevated at 0.04. TSH was normal. He was admitted and treated with IV Lasix with good response. He is -1.4 L this admission. He says that his baseline weight is somewhere between 235 and 240 and he is currently measured at 241. Symptomatically, he is much improved. An echocardiogram was performed this morning and has shown an EF of 30-35% with diffuse hypokinesis.  Inpatient Medications  . aspirin EC  81 mg Oral Daily  . enoxaparin (LOVENOX) injection  40 mg Subcutaneous Q24H  . furosemide  40 mg Intravenous Once  . sodium chloride  3 mL Intravenous Q12H    Family History Family History  Problem Relation Age of Onset  . Other Father     died young in [place].  . Heart attack Mother     alive @ 87.  . Other Brother     A & W  . Other Sister     A & W     Social History Social History   Social History  . Marital Status: Married    Spouse Name: N/A  . Number of Children: N/A  . Years of Education: N/A   Occupational History  . Not on file.   Social History Main Topics  . Smoking status: Former Smoker -- 1.00 packs/day for 15 years    Types: Cigarettes  . Smokeless tobacco: Not on file     Comment: quit smoking ~ 20 yrs ago.  . Alcohol Use: No       Comment: prev drank - none in ~ 25 yrs.  . Drug Use: No     Comment: prev smoked MJ, none in 25 yrs.  . Sexual Activity: Yes   Other Topics Concern  . Not on file   Social History Narrative   Pt lives in [place] with his wife and son.  He has 2 step children and another child - all grown and out of the house.     Review of Systems  General:  No chills, fever, night sweats or weight changes.  Cardiovascular:  Positive exertional chest pressure/tightness as well as dyspnea. He has been noticing lower extreme edema as well as increasing early satiety and developed orthopnea on the day of admission. No palpitations, paroxysmal  nocturnal dyspnea. Dermatological: No rash, lesions/masses Respiratory: No cough, positive dyspnea Urologic: No hematuria, dysuria Abdominal:   No nausea, vomiting, diarrhea, bright red blood per rectum, melena, or hematemesis Neurologic:  No visual changes, wkns, changes in mental status. All other systems reviewed and are otherwise negative except as noted above.  Physical Exam  Blood pressure 118/86, pulse 84, temperature 98.4 F (36.9 C), temperature source Oral, resp. rate 20, height 5' 10" (1.778 m), weight 241 lb 2.8 oz (109.396 kg), SpO2 95 %.  General: Pleasant, NAD Psych: Normal affect. Neuro: Alert and oriented X 3. Moves all extremities spontaneously. HEENT: Normal  Neck: Supple without bruits. No significant JVD. Lungs:  Resp regular and unlabored, CTA. Heart: RRR no s3, s4, or murmurs. Abdomen: Slightly firm, protuberant, non-tender, BS + x 4.  Extremities: No clubbing, cyanosis or edema. DP/PT/Radials 2+ and equal bilaterally.  Labs   Recent Labs  03/08/15 2303 03/09/15 0430  TROPONINI 0.04* 0.04*   Lab Results  Component Value Date   WBC 7.0 03/08/2015   HGB 15.3 03/08/2015   HCT 44.0 03/08/2015   MCV 90.9 03/08/2015   PLT 252 03/08/2015    Recent Labs Lab 03/09/15 0430  NA 138  K 3.6  CL 102  CO2 27  BUN 15  CREATININE 1.09  CALCIUM 9.0  GLUCOSE 109*   Radiology/Studies  Dg Chest 2 View  03/08/2015   CLINICAL DATA:  Shortness of breath with difficulty sleeping over the past few weeks. Irregular heart rhythm. Ex-smoker. Initial encounter.  EXAM: CHEST  2 VIEW  COMPARISON:  None.  FINDINGS: The heart is mildly enlarged. The mediastinal contours are normal. There is vascular congestion with diffuse interstitial and fissural prominence. There are small bilateral pleural effusions. There is a small metallic foreign density within the presternal soft tissues. Mild thoracic spine degenerative changes are noted. No acute osseous findings seen.   IMPRESSION: Congestive heart failure with mild pulmonary edema and small bilateral pleural effusions.   Electronically Signed   By: William  Veazey M.D.   On: 03/08/2015 19:28   2D Echocardiogram 8.29.2016  Study Conclusions  - Left ventricle: The cavity size was normal. There was mild   concentric hypertrophy. Systolic function was moderately to   severely reduced. The estimated ejection fraction was in the   range of 30% to 35%. Diffuse hypokinesis. - Aortic valve: A bicuspid morphology cannot be excluded; severely   thickened, moderately calcified leaflets. Valve mobility was   restricted. There was mild stenosis. However, visually aortic   valve restriction appears more significant. Non coronary cusp was   most mobile when compared to other leaflets. Doppler severity of   aortic stenosis may be underestimated secondary to decreased   ejection fraction (EF). There   was mild regurgitation. Valve area   (VTI): 1.86 cm^2. Valve area (Vmax): 1.54 cm^2. Valve area   (Vmean): 1.51 cm^2. - Aorta: The aorta was moderately dilated. Aortic root dimension:   51 mm (ED). - Mitral valve: There was moderate regurgitation. - Left atrium: The atrium was mildly dilated. _____________   ECG: Rsr, 75, lad, lae, lvh w/ repolarization abnormalities.    ASSESSMENT AND PLAN  1. Acute systolic congestive heart failure: Patient presents with a one-month history of progressive dyspnea on exertion and substernal chest pressure with subsequent development of lower extremity edema, increasing abdominal girth, early satiety, and orthopnea on the morning of admission. Chest x-ray admission showed CHF while BNP was elevated. He has been treated with 2 doses of IV Lasix with brisk response and symptomatically improvement. Volume status appears to be pretty good though his abdomen is slightly firm. His neck veins are somewhat difficult to gauge secondary to girth. EF has been found to be depressed at 30-35% with  diffuse hypokinesis on echocardiography today. We discussed the workup of systolic heart failure at length. His wife and sister-in-law were present in the room. We will plan on diagnostic right and left heart cardiac catheterization to rule out ischemic coronary artery disease.  The patient understands that risks include but are not limited to stroke (1 in 1000), death (1 in 1000), kidney failure [usually temporary] (1 in 500), bleeding (1 in 200), allergic reaction [possibly serious] (1 in 200), and agrees to proceed.  I suspect he still has some volume on board and he just now received another dose of lasix 40 IV. Add low-dose beta blocker and ACE inhibitor therapy. Could consider switching to entresto at a later date if blood pressure is stable.  2. Unstable angina/elevated troponin: As above, patient has been experiencing exertional substernal chest tightness over the past month in association with dyspnea. Symptoms concerning for angina. Troponins are minimally elevated with a flat trend, likely suggesting demand ischemia. In light of new cardiomyopathy, plan on right left heart diagnostic catheterization tomorrow as above. Continue aspirin. Add low-dose beta blocker. Will add statin for now and check lipids and LFTs in the a.m.  3. Essential hypertension: Patient reports a history of hypertension that has been untreated. Pressures were mildly elevated on admission but have since improved. Follow.  Signed, Christopher Berge, NP 03/09/2015, 4:07 PM   The patient was seen, examined and discussed with Chris Berger, NP and agree as above.  59-year-old male who was admitted on 8/28 with 2 month of progressively worsening dyspnea, orthopnea, and was diagnosed with new acute systolic CHF and minimal troponin elevation (0.04 -->0.04), he diuresed well in iv Lasix with significant symptoms improvement, LVEF 30-35% with diffuse hypokinesis. We will schedule a right and left cath for tomorrow. Crea is normal.     Annais Crafts H 03/09/2015  

## 2015-03-10 NOTE — Progress Notes (Signed)
TR BAND REMOVAL  LOCATION:  right radial  DEFLATED PER PROTOCOL:  Yes.    TIME BAND OFF / DRESSING APPLIED:   1830   SITE UPON ARRIVAL:   Level 0  SITE AFTER BAND REMOVAL:  Level 0  REVERSE ALLEN'S TEST:    positive  CIRCULATION SENSATION AND MOVEMENT:  Within Normal Limits  Yes.    COMMENTS:    

## 2015-03-10 NOTE — Interval H&P Note (Signed)
Cath Lab Visit (complete for each Cath Lab visit)  Clinical Evaluation Leading to the Procedure:   ACS: No.  Non-ACS:    Anginal Classification: CCS III  Anti-ischemic medical therapy: Minimal Therapy (1 class of medications)  Non-Invasive Test Results: No non-invasive testing performed  Prior CABG: No previous CABG      History and Physical Interval Note:  03/10/2015 10:37 AM  William Contreras  has presented today for surgery, with the diagnosis of chf  The various methods of treatment have been discussed with the patient and family. After consideration of risks, benefits and other options for treatment, the patient has consented to  Procedure(s): Right/Left Heart Cath and Coronary Angiography (N/A) as a surgical intervention .  The patient's history has been reviewed, patient examined, no change in status, stable for surgery.  I have reviewed the patient's chart and labs.  Questions were answered to the patient's satisfaction.     William Contreras [employer]

## 2015-03-10 NOTE — Progress Notes (Signed)
CM spoke with pt and wife regarding Brilinta and provided booklet with 30 day free card and copay card. Benefits check in process.     CM called [facility] and confirmed medication is in stock, pt/wife made aware. CM will f/u with benefits check in am with pt. Whitman Hero RN,BSN,CM [phone]

## 2015-03-10 NOTE — Progress Notes (Signed)
Heart Failure Navigator Consult Note  Presentation: William Contreras is a 59 year old male with a history of untreated hypertension and obesity who was admitted on August 28 with a one-month history of progressive dyspnea on exertion and exertional chest pressure and has now been found to have an EF of 30-35% with diffuse hypokinesis.  Past Medical History  Diagnosis Date  . Essential hypertension   . Morbid obesity     Social History   Social History  . Marital Status: Married    Spouse Name: N/A  . Number of Children: N/A  . Years of Education: N/A   Social History Main Topics  . Smoking status: Former Smoker -- 1.00 packs/day for 15 years    Types: Cigarettes  . Smokeless tobacco: None     Comment: quit smoking ~ 20 yrs ago.  . Alcohol Use: No     Comment: prev drank - none in ~ 25 yrs.  . Drug Use: No     Comment: prev smoked MJ, none in 25 yrs.  . Sexual Activity: Yes   Other Topics Concern  . None   Social History Narrative   Pt lives in [place] with his wife and son.  He has 2 step children and another child - all grown and out of the house.    ECHO:Study Conclusions--03/09/15  - Left ventricle: The cavity size was normal. There was mild concentric hypertrophy. Systolic function was moderately to severely reduced. The estimated ejection fraction was in the range of 30% to 35%. Diffuse hypokinesis. - Aortic valve: A bicuspid morphology cannot be excluded; severely thickened, moderately calcified leaflets. Valve mobility was restricted. There was mild stenosis. However, visually aortic valve restriction appears more significant. Non coronary cusp was most mobile when compared to other leaflets. Doppler severity of aortic stenosis may be underestimated secondary to decreased ejection fraction (EF). There was mild regurgitation. Valve area (VTI): 1.86 cm^2. Valve area (Vmax): 1.54 cm^2. Valve area (Vmean): 1.51 cm^2. - Aorta: The aorta  was moderately dilated. Aortic root dimension: 51 mm (ED). - Mitral valve: There was moderate regurgitation. - Left atrium: The atrium was mildly dilated.  Transthoracic echocardiography. M-mode, complete 2D, spectral Doppler, and color Doppler. Birthdate: Patient birthdate: 1956/07/02. Age: Patient is 59 yr old. Sex: Gender: male. BMI: 34.6 kg/m^2. Blood pressure:   118/86 Patient status: Inpatient. Study date: Study date: 03/09/2015. Study time: 11:10 AM. Location: Bedside.  BNP    Component Value Date/Time   BNP 938.2* 03/08/2015 1640    ProBNP No results found for: PROBNP   Education Assessment and Provision:  Detailed education and instructions provided on heart failure disease management including the following:  Signs and symptoms of Heart Failure When to call the physician Importance of daily weights Low sodium diet Fluid restriction Medication management Anticipated future follow-up appointments  Patient education given on each of the above topics.  Patient acknowledges understanding and acceptance of all instructions.  I spoke with patient and his wife regarding his new HF diagnosis.  He tells me that he has never been in the hospital or "sick" before.  He has a scale at home and agrees to weigh daily.  We discussed the importance of daily weights and how they relate to the signs and symptoms of HF as well as when to call the physician.  I reinforced a low sodium diet and high sodium foods to avoid.  He has not previously "been careful" with diet at home.  He does not believe that getting  or taking prescribed medications will be an issue.   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  He lives with his wife and son in [place].  He will likely follow with [facility] after discharge and has requested assistance with getting a PCP.  I will collaborate with Care Manager regarding PCP local to them.  Education  Materials:  "Living Better With Heart Failure" Booklet, Daily Weight Tracker Tool and Heart Failure Educational Video.   High Risk Criteria for Readmission and/or Poor Patient Outcomes:   EF <30%- 30-35%  2 or more admissions in 6 months- No new HF  Difficult social situation- No lives with wife  Demonstrates medication noncompliance- No    Barriers of Care:    Knowledge (new diagnosis) and compliance  Discharge Planning:   Plans to return to home alone

## 2015-03-10 NOTE — Progress Notes (Signed)
Patient Information: 1. Suspected CAD (No Prior PCI, No Prior CABG, and No Prior Angiogram Showing >= 50% Angiographic Stenosis) 2. No Prior Noninvasive Testing 3. Symptomatic 4. High Pretest Probability A (7) Indication: 10; 

## 2015-03-10 NOTE — Progress Notes (Signed)
Right brachial veinous sheath removed at 1650, manual pressure applied for 10 minutes.  Site level zero; no complications.

## 2015-03-10 NOTE — Progress Notes (Signed)
   Subjective:  Cath today, anxious but was ready for it.  Objective:  Vitals reviewed Gen: a&o Cv: rrr, no mrg Resp: bibasilar crackles,  Abd,: soft nt nd Extremities: trace edema, has bruised on right lower extremity  Output- about 2 L  Labs, imaging, micro and cultures are reviewed, and the pertinent ones are discussed either in Subjective or in the Assessment and Plan  A/P 59 yo with no pmh came in with 1 month history of DOE and SOB, likely volume overload due to acute diastolic heart failure.   HFrEF: BNP 900s, with EF of 30-35% and dilated atria -has put out 2 L over past 24 hours, will reduce his lasix today, continue -I&O and daily weights -continue aspirin 81 mg, beta blocker, acei and atorva 80 mg -HIV antibody nonreactive, ferritin and iron WNL, uds neg, tsh wnl -Lipid panel shows total chol 282, with LDL of 200, patient has hyperchol which has not been treated yet -cath today for ischemic workup -showed extensive stenosis of the vessel- RCA, and Circumflex. -underwent successful 2 vessel PCI with a drug eluting stents  -Per cardiology, will need dual antiplatelet therapy for 1 year, and continue the other risk modification.  -likely discharge tomorrow and will need follow up with cardiology    ** LOS: 2 days   Burgess Estelle, MD 03/10/2015, 4:24 PM

## 2015-03-11 ENCOUNTER — Other Ambulatory Visit: Payer: Self-pay

## 2015-03-11 ENCOUNTER — Encounter (HOSPITAL_COMMUNITY): Payer: Self-pay | Admitting: Cardiology

## 2015-03-11 DIAGNOSIS — Z955 Presence of coronary angioplasty implant and graft: Secondary | ICD-10-CM | POA: Insufficient documentation

## 2015-03-11 DIAGNOSIS — I200 Unstable angina: Secondary | ICD-10-CM

## 2015-03-11 LAB — CBC
HEMATOCRIT: 42.6 % (ref 39.0–52.0)
Hemoglobin: 14.4 g/dL (ref 13.0–17.0)
MCH: 31.2 pg (ref 26.0–34.0)
MCHC: 33.8 g/dL (ref 30.0–36.0)
MCV: 92.4 fL (ref 78.0–100.0)
PLATELETS: 230 10*3/uL (ref 150–400)
RBC: 4.61 MIL/uL (ref 4.22–5.81)
RDW: 12.4 % (ref 11.5–15.5)
WBC: 8.9 10*3/uL (ref 4.0–10.5)

## 2015-03-11 LAB — BASIC METABOLIC PANEL
Anion gap: 7 (ref 5–15)
BUN: 17 mg/dL (ref 6–20)
CHLORIDE: 104 mmol/L (ref 101–111)
CO2: 26 mmol/L (ref 22–32)
CREATININE: 1 mg/dL (ref 0.61–1.24)
Calcium: 8.7 mg/dL — ABNORMAL LOW (ref 8.9–10.3)
GFR calc Af Amer: >60 mL/min (ref >60)
GFR calc non Af Amer: >60 mL/min (ref >60)
GLUCOSE: 101 mg/dL — AB (ref 65–99)
POTASSIUM: 4.1 mmol/L (ref 3.5–5.1)
SODIUM: 137 mmol/L (ref 135–145)

## 2015-03-11 MED ORDER — SPIRONOLACTONE 25 MG PO TABS: 12.5000 mg | ORAL_TABLET | Freq: Every day | ORAL | Status: DC

## 2015-03-11 MED ORDER — CARVEDILOL 6.25 MG PO TABS: 6.2500 mg | ORAL_TABLET | Freq: Two times a day (BID) | ORAL | Status: DC

## 2015-03-11 MED ORDER — TICAGRELOR 90 MG PO TABS: 90.0000 mg | ORAL_TABLET | Freq: Two times a day (BID) | ORAL | Status: DC

## 2015-03-11 MED ORDER — LISINOPRIL 2.5 MG PO TABS: 2.5000 mg | ORAL_TABLET | Freq: Every day | ORAL | Status: DC

## 2015-03-11 MED ORDER — CARVEDILOL 3.125 MG PO TABS
6.2500 mg | ORAL_TABLET | Freq: Two times a day (BID) | ORAL | Status: DC
  Administered 2015-03-11 (×2): 6.25 mg via ORAL
  Filled 2015-03-11 (×4): qty 2

## 2015-03-11 MED ORDER — ATORVASTATIN CALCIUM 80 MG PO TABS: 80.0000 mg | ORAL_TABLET | Freq: Every day | ORAL | Status: DC

## 2015-03-11 MED ORDER — FUROSEMIDE 20 MG PO TABS: 20.0000 mg | ORAL_TABLET | Freq: Every day | ORAL | Status: DC

## 2015-03-11 MED ORDER — FUROSEMIDE 20 MG PO TABS
20.0000 mg | ORAL_TABLET | Freq: Every day | ORAL | Status: DC
  Administered 2015-03-11: 20 mg via ORAL
  Filled 2015-03-11: qty 1

## 2015-03-11 MED ORDER — SPIRONOLACTONE 12.5 MG HALF TABLET
12.5000 mg | ORAL_TABLET | Freq: Every day | ORAL | Status: DC
  Administered 2015-03-11: 09:00:00 12.5 mg via ORAL
  Filled 2015-03-11: qty 1

## 2015-03-11 MED FILL — Verapamil HCl IV Soln 2.5 MG/ML: INTRAVENOUS | Qty: 2 | Status: AC

## 2015-03-11 NOTE — Progress Notes (Signed)
CM to  f/u with pt regarding benefits check: Pt copay will be $30- prior auth not required. Pt made aware by CM. Whitman Hero RN,BSN,CM [phone]

## 2015-03-11 NOTE — Discharge Summary (Signed)
Name: William Contreras MRN: [id number] DOB: 04/03/1956 59 y.o. PCP: No Pcp Per Patient  Date of Admission: 03/08/2015  5:24 PM Date of Discharge: 03/11/2015 Attending Physician: Axel Filler, MD  Discharge Diagnosis: 1. Acute on chronic newly diagnosed HFrEF Principal Problem:   Acute systolic CHF (congestive heart failure) Active Problems:   Essential hypertension   Coronary artery disease involving native coronary artery of native heart with unstable angina pectoris   CAD (coronary artery disease)   Hyperlipidemia  Discharge Medications:   Medication List    STOP taking these medications        calcium citrate 950 MG tablet  Commonly known as:  CALCITRATE - dosed in mg elemental calcium     FLAX SEED OIL PO     Hawthorne Berry 550 MG Caps     Magnesium 250 MG Tabs      TAKE these medications        aspirin EC 81 MG tablet  Take 81 mg by mouth daily.     atorvastatin 80 MG tablet  Commonly known as:  LIPITOR  Take 1 tablet (80 mg total) by mouth daily at 6 PM.     carvedilol 6.25 MG tablet  Commonly known as:  COREG  Take 1 tablet (6.25 mg total) by mouth 2 (two) times daily with a meal.     Fish Oil 1000 MG Cpdr  Take 1,000 mg by mouth daily.     fluticasone 50 MCG/ACT nasal spray  Commonly known as:  FLONASE  Place 2 sprays into both nostrils daily as needed for allergies or rhinitis.     furosemide 20 MG tablet  Commonly known as:  LASIX  Take 1 tablet (20 mg total) by mouth daily.     lisinopril 2.5 MG tablet  Commonly known as:  PRINIVIL,ZESTRIL  Take 1 tablet (2.5 mg total) by mouth daily.     multivitamin tablet  Take 1 tablet by mouth daily.     spironolactone 25 MG tablet  Commonly known as:  ALDACTONE  Take 0.5 tablets (12.5 mg total) by mouth daily.     ticagrelor 90 MG Tabs tablet  Commonly known as:  BRILINTA  Take 1 tablet (90 mg total) by mouth 2 (two) times daily.        Disposition and follow-up:   William  Contreras was discharged from [facility] in Good condition.  At the hospital follow up visit please address:  1.  Blood pressure: how is his blood pressure after starting Aspirin 81, ticagrelor, lasix 20 mg daily, lisinopril 2.5 mg daily, spironolactone 12.5 mg daily, and atorvastatin 80 mg qd, and coreg  Has the dyspnea improved?  Follow up with cardiology in 2 weeks   Has he stopped taking the supplements like Hawthorne etc which he was taking- we have explained to him the risks of taking supplements with unknown side effects   2.  Labs / imaging needed at time of follow-up: BMET  3.  Pending labs/ test needing follow-up:   Follow-up Appointments:     Follow-up Information    Follow up with Loralie Champagne, MD On 03/25/2015.   Specialty:  Cardiology   Why:  at 2 pm for post hospital visit.  Please bring all of your medications to your appointment.  Pt parking code is [id number].   Contact information:   [address] [place] [address] [phone]       Follow up with Zada Finders,  MD. Daphane Shepherd on 03/18/2015.   Specialty:  Internal Medicine   Why:  8:45 AM for hospital follow up   Contact information:   [place] [id number] [phone]       Discharge Instructions: Discharge Instructions    Amb Referral to Cardiac Rehabilitation    Complete by:  As directed   Congestive Heart Failure: If diagnosis is Heart Failure, patient MUST meet each of the CMS criteria: 1. Left Ventricular Ejection Fraction </= 35% 2. NYHA class II-IV symptoms despite being on optimal heart failure therapy for at least 6 weeks. 3. Stable = have not had a recent (<6 weeks) or planned (<6 months) major cardiovascular hospitalization or procedure  Program Details: - Physician supervised classes - 1-3 classes per week over a 12-18 week period, generally for a total of 36 sessions  Physician Certification: I certify that the above Cardiac Rehabilitation  treatment is medically necessary and is medically approved by me for treatment of this patient. The patient is willing and cooperative, able to ambulate and medically stable to participate in exercise rehabilitation. The participant's progress and Individualized Treatment Plan will be reviewed by the Medical Director, Cardiac Rehab staff and as indicated by the Referring/Ordering Physician.  Diagnosis:   PCI Comment - to [place] Failure (see criteria below)       Diet - low sodium heart healthy    Complete by:  As directed      Discharge instructions    Complete by:  As directed   Please take the carvedilol, lisinopril, furosemide, spironolactone, and Brilinta, and aspirin, and other medications  as instructed and prescribed in the AVS  Please STOP [place], and any other SUPPLEMENTS NOT APPROVED BY MD PHYSICIANS   Please follow up with cardiology in 2 weeks  Please follow up in our clinic for primary care next week- an appointment has been made for you, you can also choose to go to your PCP where you live and in that case, you can cancel the appointment.     Increase activity slowly    Complete by:  As directed            Consultations: Treatment Team:  Rounding Lbcardiology, MD  Procedures Performed:  Dg Chest 2 View  03/08/2015   CLINICAL DATA:  Shortness of breath with difficulty sleeping over the past few weeks. Irregular heart rhythm. Ex-smoker. Initial encounter.  EXAM: CHEST  2 VIEW  COMPARISON:  None.  FINDINGS: The heart is mildly enlarged. The mediastinal contours are normal. There is vascular congestion with diffuse interstitial and fissural prominence. There are small bilateral pleural effusions. There is a small metallic foreign density within the presternal soft tissues. Mild thoracic spine degenerative changes are noted. No acute osseous findings seen.  IMPRESSION: Congestive heart failure with mild pulmonary edema and small bilateral  pleural effusions.   Electronically Signed   By: Richardean Sale M.D.   On: 03/08/2015 19:28    2D Echo: 30-35% with diffuse hypokinesis and dilated atria  Cardiac Cath: 2 vessel PCI with DES x2  Admission HPI:  William Contreras is a 59 y.o. caucasian male with no significant past medical history who presents to the emergency department for evaluation of progressively worsening shortness of breath. Patient was seen at an urgent care today for this complaint and was sent to this ED when his EKG revealed an irregular rhythm. About a year and a half ago, William Contreras was treated  as an outpatient for presumed pneumonia/bronhchitis with antibiotics and steroids. Since about that time he has experienced shortness of breath aggravated by exertion and relieved with rest. States that the SOB has been progressively worsening over this period of time and he finds it difficult to catch his breath with such activities as mowing the lawn. Over the past few weeks, he is also experiencing symptoms while lying down in bed and this is relieved by sitting up. States that he can hear a rumbling in his chest while lying down. States that he does not sleep with extra pillows. Also endorses a dry cough that is aggravated by lying down. He denies any chest pain with his symptomology as well as any palpitations. Does not endorse any recent weight gain or excess swelling in his extremities. Family history is positive for his mother having had a heart attack in her 50s.   While in the ED, patient had a negative troponin, BNP of 938, and 2-view chest xray that was read as having mild pulmonary edema and small bilateral pleural effusions. He was also given 40mg  IV Lasix.   Hospital Course by problem list: Principal Problem:   Acute systolic CHF (congestive heart failure) Active Problems:   Essential hypertension   Coronary artery disease involving native coronary artery of native heart with unstable angina  pectoris   CAD (coronary artery disease)   Hyperlipidemia   1. Acute on chronic HFrEF: Patient's SOB and DOE was most consistent with volume overload due to CHF suggested by the history and physical exam, elevated BNP, and EKG which showed LVH with QRS widening and repolarization abnormality. ACS was ruled out due to normal troponins x 3. He had an echo done which showed an EF of 30-35% with diffuse hypokinesis and dilated atria. He was lasix naive, and diuresed very well with 40 mg IV lasix in the ER and another dose of 40 IV on the floor with output of about 1.5 L x2. He had HTN, and his lipid panel showed elevated LDL and triglycerides so the HF was likely ischemic in origin. Cardiology was consulted and they did a left and right heart catheterization which showed significant stenosis mainly in the rca and the lcx, and patient underwent successful 2 vessel PCI with 2 drug eluting stents. The patient tolerated the procedure well and stayed overnight for observation.  For secondary prevention and medical management of his heart failure, The patient was started and sent home with Aspirin 81, ticagrelor, lasix 20 mg daily, lisinopril 2.5 mg daily, spironolactone 12.5 mg daily, and atorvastatin 80 mg qd, and carvedilol 6.125 bid. He also exhibited an interest in the lifevest, so it was provided for him upon discharge. The patient will follow up with cardiology in 2 weeks, and then 3 months for repeat echo. Patient will also follow up in our clinic next week, or if he manages to find a PCP by next week, he will follow up there to [place]. Cardiac rehab was coordinated by cardiology [place].   Discharge Vitals:   BP 129/89 mmHg  Pulse 81  Temp(Src) 98.6 F (37 C) (Oral)  Resp 20  Ht 5\' 10"  (1.778 m)  Wt 237 lb 3.4 oz (107.6 kg)  BMI 34.04 kg/m2  SpO2 98%  Discharge Labs:  Results for orders placed or performed during the hospital encounter of 03/08/15 (from the past 24 hour(s))  Basic  metabolic panel     Status: Abnormal   Collection Time: 03/11/15  3:19  AM  Result Value Ref Range   Sodium 137 135 - 145 mmol/L   Potassium 4.1 3.5 - 5.1 mmol/L   Chloride 104 101 - 111 mmol/L   CO2 26 22 - 32 mmol/L   Glucose, Bld 101 (H) 65 - 99 mg/dL   BUN 17 6 - 20 mg/dL   Creatinine, Ser 1.00 0.61 - 1.24 mg/dL   Calcium 8.7 (L) 8.9 - 10.3 mg/dL   GFR calc non Af Amer >60 >60 mL/min   GFR calc Af Amer >60 >60 mL/min   Anion gap 7 5 - 15  CBC     Status: None   Collection Time: 03/11/15  3:19 AM  Result Value Ref Range   WBC 8.9 4.0 - 10.5 K/uL   RBC 4.61 4.22 - 5.81 MIL/uL   Hemoglobin 14.4 13.0 - 17.0 g/dL   HCT 42.6 39.0 - 52.0 %   MCV 92.4 78.0 - 100.0 fL   MCH 31.2 26.0 - 34.0 pg   MCHC 33.8 30.0 - 36.0 g/dL   RDW 12.4 11.5 - 15.5 %   Platelets 230 150 - 400 K/uL    Signed: Burgess Estelle, MD 03/11/2015, 2:23 PM    Services Ordered on Discharge:  Equipment Ordered on Discharge:

## 2015-03-11 NOTE — Progress Notes (Addendum)
Patient ID: William Contreras, male   DOB: 1956-02-23, 59 y.o.   MRN: [id number]   SUBJECTIVE: Mr Chamberland is doing well this morning, no dyspnea or chest pain.  S/p DES x 2 (RCA and LCx) yesterday.   RHC (8/30) Mean RA 5 PA 46/15 Mean PCWP 20 CI 2.06 PVR 2.6  Scheduled Meds: . aspirin  81 mg Oral Daily  . atorvastatin  80 mg Oral q1800  . carvedilol  6.25 mg Oral BID WC  . enoxaparin (LOVENOX) injection  40 mg Subcutaneous Q24H  . furosemide  20 mg Oral Daily  . lisinopril  2.5 mg Oral Daily  . spironolactone  12.5 mg Oral Daily  . ticagrelor  90 mg Oral BID   Continuous Infusions:  PRN Meds:.acetaminophen, morphine injection, ondansetron (ZOFRAN) IV    Filed Vitals:   03/10/15 1800 03/10/15 2024 03/11/15 0328 03/11/15 0759  BP: 111/74 112/80 112/81 129/89  Pulse: 75 78 73 81  Temp:  97.6 F (36.4 C) 97.6 F (36.4 C) 98.6 F (37 C)  TempSrc:  Oral Oral Oral  Resp: 15 22 20 20   Height:      Weight:   237 lb 3.4 oz (107.6 kg)   SpO2: 94% 94% 95% 97%    Intake/Output Summary (Last 24 hours) at 03/11/15 0844 Last data filed at 03/11/15 0759  Gross per 24 hour  Intake    786 ml  Output   2275 ml  Net  -1489 ml    LABS: Basic Metabolic Panel:  Recent Labs  03/10/15 0531 03/11/15 0319  NA 136 137  K 3.1* 4.1  CL 100* 104  CO2 26 26  GLUCOSE 109* 101*  BUN 19 17  CREATININE 0.95 1.00  CALCIUM 8.9 8.7*   Liver Function Tests:  Recent Labs  03/10/15 0531  AST 15  ALT 15*  ALKPHOS 41  BILITOT 1.1  PROT 6.6  ALBUMIN 3.5   No results for input(s): LIPASE, AMYLASE in the last 72 hours. CBC:  Recent Labs  03/08/15 1640 03/11/15 0319  WBC 7.0 8.9  HGB 15.3 14.4  HCT 44.0 42.6  MCV 90.9 92.4  PLT 252 230   Cardiac Enzymes:  Recent Labs  03/08/15 2303 03/09/15 0430  TROPONINI 0.04* 0.04*   BNP: Invalid input(s): POCBNP D-Dimer: No results for input(s): DDIMER in the last 72 hours. Hemoglobin A1C:  Recent Labs  03/09/15 1919    HGBA1C 5.6   Fasting Lipid Panel:  Recent Labs  03/10/15 0531  CHOL 282*  HDL 35*  LDLCALC 200*  TRIG 234*  CHOLHDL 8.1   Thyroid Function Tests:  Recent Labs  03/09/15 0430  TSH 0.941   Anemia Panel:  Recent Labs  03/09/15 1919  FERRITIN 55  TIBC 389  IRON 49    RADIOLOGY: Dg Chest 2 View  03/08/2015   CLINICAL DATA:  Shortness of breath with difficulty sleeping over the past few weeks. Irregular heart rhythm. Ex-smoker. Initial encounter.  EXAM: CHEST  2 VIEW  COMPARISON:  None.  FINDINGS: The heart is mildly enlarged. The mediastinal contours are normal. There is vascular congestion with diffuse interstitial and fissural prominence. There are small bilateral pleural effusions. There is a small metallic foreign density within the presternal soft tissues. Mild thoracic spine degenerative changes are noted. No acute osseous findings seen.  IMPRESSION: Congestive heart failure with mild pulmonary edema and small bilateral pleural effusions.   Electronically Signed   By: Richardean Sale M.D.   On: 03/08/2015 19:28  PHYSICAL EXAM General: NAD Neck: No JVD, no thyromegaly or thyroid nodule.  Lungs: Clear to auscultation bilaterally with normal respiratory effort. CV: Nondisplaced PMI.  Heart regular S1/S2, no S3/S4, no murmur.  No peripheral edema.  No carotid bruit.  Normal pedal pulses.  Abdomen: Soft, nontender, no hepatosplenomegaly, no distention.  Neurologic: Alert and oriented x 3.  Psych: Normal affect. Extremities: No clubbing or cyanosis.   TELEMETRY: Reviewed telemetry pt in NSR with PVCs  ASSESSMENT AND PLAN: 59 yo on no medications with history of HTN presented with acute systolic CHF and ACS.  1. CAD: NSTEMI with TnI 0.04 (mildly elevated).  LHC yesterday showed diffuse coronary disease, most significant in the RCA and LCx.  He had DES to proximal-mid RCA and mid LCx.  However, he has significant moderate residual disease and will need aggressive risk  factor modification.   - Continue ASA 81 + Brilinta. - Continue atorvastatin 80 daily - Will need to do cardiac rehab at hospital in [facility].  2. Acute systolic CHF: EF 62-22% on echo, heart appeared significantly enlarged on cath.  Suspect ischemic cardiomyopathy though HTN may be playing a role.   He has been diuresed and volume status now looks good.  - Frequent PVCs, increase Coreg to 6.25 mg bid.  - Continue lisinopril 2.5 mg daily and add spironolactone 12.5 mg daily.  - Lasix 20 mg po daily.  - Question of Lifevest => he came in with ACS (TnI 0.04, mildly elevated), EF < 35%, and frequent PVCs on telemetry.  I offered Lifevest to him and explained its function, he is willing to do this as long as insurance covers.  Will fit him for Lifevest and repeat echo at 3 months to assess for ICD.  3. HTN: BP controlled on current meds.  4. Disposition: I think he could go home today.  Will need the cardiac meds as listed in this note.  Will need followup with me in CHF clinic in 2 wks.  Will need Lifevest as above.  Will need note to stay out of work for 2 wks until he sees me back.   Loralie Champagne 03/11/2015 8:54 AM

## 2015-03-11 NOTE — Progress Notes (Signed)
   Subjective: Had cath yesterday and DES x2 in rca and lcx Pt is feeling well, denies chest pain or dyspnea   Objective:  Vitals reviewed Gen: a&o Cv: rrr, no mrg Resp: bibasilar crackles,  Extremities: has bandage on right wrist s/p cath   Output- about 1600 mL  Labs, imaging, micro and cultures are reviewed, and the pertinent ones are discussed either in Subjective or in the Assessment and Plan  A/P 59 yo with no pmh came in with 1 month history of DOE and SOB, likely volume overload due to acute diastolic heart failure.   HFrEF with diastolic heart failure: BNP 900s, with EF of 30-35% and dilated atria -s/p left heart cath showed significant stenosis in multiple vessels, and s/p DES in RCA and Lcx  -Will need to go home with and start:-  Aspirin 81, ticagrelor, lasix 20 mg daily, lisinopril 2.5 mg daily, spironolactone 12.5 mg daily, and atorvastatin 80 mg qd -Lifevest- being coordinated by cardiology per their recs -cardiac rehab at [facility]- is coordniated by cardiology - hospital follow up appointment- will need to follow up to cards in 2 weeks, and at 3 month for repeat echo -HFU with PCP- here or elsewhere      LOS: 3 days   Burgess Estelle, MD 03/11/2015, 11:45 AM

## 2015-03-11 NOTE — Progress Notes (Signed)
CARDIAC REHAB PHASE I   PRE:  Rate/Rhythm: 84 SR    BP: sitting 129/89    SaO2:   MODE:  Ambulation: 500 ft   POST:  Rate/Rhythm: 98 SR with PVCs    BP: sitting 136/93     SaO2:   Ambulated with quick pace. Increased PVCs. Pt denied sx/SOB. Sts he feels well. BP elevated, pt sts this is his norm and that he was previously taking Hawthorne herbal supplement to treat his HTN. In depth education including stents, Brilinta, HF, daily wts, low sodium/low fat, ex, NTG, lifevest and CRPII. Voiced understanding but pt is VERY overwhelmed with new information. Will refer to CRPII in [facility] but pt is afraid work will be problematic. Encouraged pt to watch ed videos today while he is waiting on lifevest.  [phone]   Darrick Meigs CES, ACSM 03/11/2015 9:26 AM

## 2015-03-11 NOTE — Discharge Instructions (Addendum)
Please take the carvedilol, lisinopril, furosemide, spironolactone, and Brilinta, and aspirin, atorvastatin, and other medications  as instructed and prescribed in the AVS  Please STOP [place], and any other SUPPLEMENTS NOT APPROVED BY MD PHYSICIANS   Please follow up with cardiology in 2 weeks  Please follow up in our clinic for primary care next week- an appointment has been made for you, you can also choose to go to your PCP where you live and in that case, you can cancel the appointment.

## 2015-03-11 NOTE — Progress Notes (Signed)
Have been in touch with Janan Halter, the nurse at our office who helps to facilitate Lifevest. Order has been faxed to her.  She has to process information which will likely take the duration of the day - she can come around 6:30pm to fit the device if Lifevest is approved. If there are any questions by the primary team, she can be reached at our office at [phone] - ask for Janan Halter. Dayna Dunn PA-C

## 2015-03-18 ENCOUNTER — Ambulatory Visit: Payer: BLUE CROSS/BLUE SHIELD | Admitting: Internal Medicine

## 2015-03-18 ENCOUNTER — Telehealth (HOSPITAL_COMMUNITY): Payer: Self-pay | Admitting: Surgery

## 2015-03-18 NOTE — Telephone Encounter (Signed)
Can cut back Coreg to 3.125 mg bid to see if this helps.

## 2015-03-18 NOTE — Telephone Encounter (Signed)
Heart Failure Nurse Navigator Post Discharge Follow-up Telephone Call  I called William Contreras to check on him after his recent hospitalization.  He tells me that he called yesterday to the [facility] regarding "medication side effects".  These include dizziness, muscle cramps and tingling in hands and feet--all seem to be worse after he takes medications and while exerting himself.  He says that his breathing is "much better".  His weight is down --230.4 lbs versus discharge weight of 237.2lbs on 03/11/15.  He has been walking a mile each day.  He is having no chest pain.  He has a follow-up appt scheduled on 9/14 in the [facility] Clinic and I told him that I will follow-up with William Contreras and call him back with any changes.  He is aware and agreeable.

## 2015-03-19 MED ORDER — CARVEDILOL 6.25 MG PO TABS: 3.1250 mg | ORAL_TABLET | Freq: Two times a day (BID) | ORAL | Status: DC

## 2015-03-19 NOTE — Telephone Encounter (Signed)
Called William Contreras back to inform him that Dr. Aundra Dubin asks that he cut back Coreg to 3.125 BID.  He is aware and agreeable.  I also asked that he call back with any worsening "side effects" or complaints.

## 2015-03-25 ENCOUNTER — Ambulatory Visit (HOSPITAL_COMMUNITY)
Admit: 2015-03-25 | Discharge: 2015-03-25 | Disposition: A | Discharge status: Home / Self Care | Payer: BLUE CROSS/BLUE SHIELD | Source: Ambulatory Visit | Attending: Internal Medicine | Admitting: Internal Medicine

## 2015-03-25 VITALS — BP 112/72 | HR 65 | Wt 231.2 lb

## 2015-03-25 DIAGNOSIS — E785 Hyperlipidemia, unspecified: Secondary | ICD-10-CM | POA: Diagnosis not present

## 2015-03-25 DIAGNOSIS — Z87891 Personal history of nicotine dependence: Secondary | ICD-10-CM | POA: Diagnosis not present

## 2015-03-25 DIAGNOSIS — I5022 Chronic systolic (congestive) heart failure: Secondary | ICD-10-CM | POA: Insufficient documentation

## 2015-03-25 DIAGNOSIS — Z7982 Long term (current) use of aspirin: Secondary | ICD-10-CM | POA: Insufficient documentation

## 2015-03-25 DIAGNOSIS — R0683 Snoring: Secondary | ICD-10-CM | POA: Diagnosis not present

## 2015-03-25 DIAGNOSIS — I255 Ischemic cardiomyopathy: Secondary | ICD-10-CM

## 2015-03-25 DIAGNOSIS — I2510 Atherosclerotic heart disease of native coronary artery without angina pectoris: Secondary | ICD-10-CM | POA: Insufficient documentation

## 2015-03-25 DIAGNOSIS — I352 Nonrheumatic aortic (valve) stenosis with insufficiency: Secondary | ICD-10-CM | POA: Diagnosis not present

## 2015-03-25 DIAGNOSIS — I252 Old myocardial infarction: Secondary | ICD-10-CM | POA: Insufficient documentation

## 2015-03-25 DIAGNOSIS — I25110 Atherosclerotic heart disease of native coronary artery with unstable angina pectoris: Secondary | ICD-10-CM

## 2015-03-25 DIAGNOSIS — I25118 Atherosclerotic heart disease of native coronary artery with other forms of angina pectoris: Secondary | ICD-10-CM | POA: Diagnosis not present

## 2015-03-25 DIAGNOSIS — Z79899 Other long term (current) drug therapy: Secondary | ICD-10-CM | POA: Diagnosis not present

## 2015-03-25 DIAGNOSIS — I10 Essential (primary) hypertension: Secondary | ICD-10-CM | POA: Diagnosis not present

## 2015-03-25 DIAGNOSIS — Z955 Presence of coronary angioplasty implant and graft: Secondary | ICD-10-CM | POA: Diagnosis not present

## 2015-03-25 DIAGNOSIS — I5021 Acute systolic (congestive) heart failure: Secondary | ICD-10-CM | POA: Diagnosis not present

## 2015-03-25 DIAGNOSIS — G4733 Obstructive sleep apnea (adult) (pediatric): Secondary | ICD-10-CM

## 2015-03-25 LAB — BASIC METABOLIC PANEL
ANION GAP: 8 (ref 5–15)
BUN: 17 mg/dL (ref 6–20)
CO2: 26 mmol/L (ref 22–32)
Calcium: 9.6 mg/dL (ref 8.9–10.3)
Chloride: 102 mmol/L (ref 101–111)
Creatinine, Ser: 1.04 mg/dL (ref 0.61–1.24)
GFR calc Af Amer: >60 mL/min (ref >60)
GFR calc non Af Amer: >60 mL/min (ref >60)
GLUCOSE: 94 mg/dL (ref 65–99)
POTASSIUM: 4 mmol/L (ref 3.5–5.1)
Sodium: 136 mmol/L (ref 135–145)

## 2015-03-25 LAB — BRAIN NATRIURETIC PEPTIDE: B Natriuretic Peptide: 383.5 pg/mL — ABNORMAL HIGH (ref 0.0–100.0)

## 2015-03-25 MED ORDER — CARVEDILOL 6.25 MG PO TABS: 3.1250 mg | ORAL_TABLET | Freq: Two times a day (BID) | ORAL | Status: DC

## 2015-03-25 MED ORDER — TICAGRELOR 90 MG PO TABS: 90.0000 mg | ORAL_TABLET | Freq: Two times a day (BID) | ORAL | Status: DC

## 2015-03-25 MED ORDER — ATORVASTATIN CALCIUM 80 MG PO TABS: 80.0000 mg | ORAL_TABLET | Freq: Every day | ORAL | Status: DC

## 2015-03-25 MED ORDER — FUROSEMIDE 20 MG PO TABS: 20.0000 mg | ORAL_TABLET | Freq: Every day | ORAL | Status: DC

## 2015-03-25 MED ORDER — SPIRONOLACTONE 25 MG PO TABS: 12.5000 mg | ORAL_TABLET | Freq: Every day | ORAL | Status: DC

## 2015-03-25 MED ORDER — LISINOPRIL 2.5 MG PO TABS: 2.5000 mg | ORAL_TABLET | Freq: Two times a day (BID) | ORAL | Status: DC

## 2015-03-25 NOTE — Patient Instructions (Signed)
Increase Lisinopril to 2.5 mg Twice daily   Labs today  Your physician has recommended that you have a sleep study. This test records several body functions during sleep, including: brain activity, eye movement, oxygen and carbon dioxide blood levels, heart rate and rhythm, breathing rate and rhythm, the flow of air through your mouth and nose, snoring, body muscle movements, and chest and belly movement.   You have been referred to Cardiac Rehab, they will contact you to schedule  You have been referred to Dr Fransico Him for sleep eval  You have been referred to [place] physician recommends that you schedule a follow-up appointment in: 3 weeks with Dr Aundra Dubin

## 2015-03-26 ENCOUNTER — Telehealth (HOSPITAL_COMMUNITY): Payer: Self-pay | Admitting: Vascular Surgery

## 2015-03-26 NOTE — Progress Notes (Signed)
Patient ID: William Contreras, male   DOB: 12-01-1955, 59 y.o.   MRN: [id number] PCP: None Cardiology: Dr. Aundra Dubin  59 yo with history of HTN was admitted in 8/16 with acute systolic CHF => volume overloaded, short of breath.  Troponin was mildly elevated to 0.04.  Echo showed EF 30-35%.  He was diuresed and had cardiac cath.  This showed diffuse severe disease but the LAD disease was relatively mild to CABG not recommended.  He had PCI with DES to mLCx and proximal to mid RCA.    Since discharge, he called because he was feeling "draggy."  We cut back his Coreg to 3.125 mg bid.  Currently, he feels like he is doing fine.  He is breathing much better than prior to admission.  He says that he had actually had exertional dyspnea for about a year.  He is walking daily for 30 minutes without dyspnea or chest pain. No orthopnea/PND.  Wife says that he snores and stops breathing at night.  He is sleepy during the day.   ECG; NSR, LBBB (132 msec)  Labs (8/16): K 4.1, creatinine 1.0, HCT 42.6, LDL 200  PMH: 1. HTN 2. CAD: NSTEMI/CHF in 8/16.  LHC (8/16) showed 40-50% mLAD, 50% prox ramus, 80% mLCx, total occlusion moderate OM2, 50% pRCA, 80% mRCA, 50% dRCA.  He had DES to prox and mid RCA and mid LCx.   3. Chronic systolic CHF: Ischemic cardiomyopathy.  Echo (8/16) with EF 30-35%, diffuse hypokinesis, mild LVH, ?bicuspid aortic valve with mild AS and mild AI, 5.1 cm aortic root.  4. 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  Lives in [place].   FH: No CAD that he knows of. +HTN.  ROS: All systems reviewed and negative except as per HPI.   Current Outpatient Prescriptions  Medication Sig Dispense Refill  . aspirin EC 81 MG tablet Take 81 mg by mouth daily.    Marland Kitchen atorvastatin (LIPITOR) 80 MG tablet Take 1 tablet (80 mg total) by mouth daily at 6 PM. 30 tablet 6  . carvedilol (COREG) 6.25 MG tablet Take 0.5 tablets (3.125 mg total) by mouth 2 (two)  times daily with a meal. 60 tablet 3  . fluticasone (FLONASE) 50 MCG/ACT nasal spray Place 2 sprays into both nostrils daily as needed for allergies or rhinitis.    . furosemide (LASIX) 20 MG tablet Take 1 tablet (20 mg total) by mouth daily. 30 tablet 3  . lisinopril (PRINIVIL,ZESTRIL) 2.5 MG tablet Take 1 tablet (2.5 mg total) by mouth 2 (two) times daily. 60 tablet 3  . Multiple Vitamin (MULTIVITAMIN) tablet Take 1 tablet by mouth daily.    . Omega-3 Fatty Acids (FISH OIL) 1000 MG CPDR Take 1,000 mg by mouth daily.    Marland Kitchen spironolactone (ALDACTONE) 25 MG tablet Take 0.5 tablets (12.5 mg total) by mouth daily. 15 tablet 3  . ticagrelor (BRILINTA) 90 MG TABS tablet Take 1 tablet (90 mg total) by mouth 2 (two) times daily. 60 tablet 6   No current facility-administered medications for this encounter.   BP 112/72 mmHg  Pulse 65  Wt 231 lb 4 oz (104.894 kg)  SpO2 97% General: NAD Neck: Thick, no JVD, no thyromegaly or thyroid nodule.  Lungs: Clear to auscultation bilaterally with normal respiratory effort. CV: Nondisplaced PMI.  Heart regular S1/S2, no S3/S4, 2/6 early SEM RUSB.  Trace ankle edema.  No carotid bruit.  Normal pedal pulses.  Abdomen: Soft,  nontender, no hepatosplenomegaly, no distention.  Skin: Intact without lesions or rashes.  Neurologic: Alert and oriented x 3.  Psych: Normal affect. Extremities: No clubbing or cyanosis.  HEENT: Normal.   Assessment/Plan: 1. CAD: S/p ACS with DES to mLCx and proximal to mid RCA.  No chest pain.  - Continue ASA 81, Brilinta 90 mg bid - Continue atorvastatin.  - He wants to do cardiac rehab at [facility], I will arrange.  - He can return to light duty at work in another week.  2. Chronic systolic CHF: EF 36-64% by echo in the hospital.  He decided against Lifevest.  NYHA class II symptoms.  - Continue current Coreg and spironolactone.  - Increase lisinopril to 2.5 mg bid.  - BMET/BNP today.  - 12/16 with do re-evaluation of EF  for ?ICD (borderline QRS for CRT).  Will have him get a cardiac MRI (see below).  3. ?Bicuspid aortic valve with mild AI and mild AS: Hard to tell number of leaflets by echo.  I will arrange for cardiac MRI with MRA of chest in 12/16.  This will allow [place] to assess EF for ICD, assess for bicuspid aortic valve, and also to assess dilatation of the thoracic aorta (aortic root was 5.1 cm by echo).   4. Hyperlipidemia: LDL was very high.  Now on atorvastatin, lipids/LFTs in 2 months.  5. OSA: Suspected.  I will refer him for sleep study.  6. I will refer to a PCP  Followup in 3 wks for med titration.   Loralie Champagne 03/26/2015

## 2015-03-26 NOTE — Telephone Encounter (Signed)
Pt was in the office on yesterday 03/25/15 ,he has a referral for cardiac rehab it needs to be sent to [facility] not [place]

## 2015-03-27 DIAGNOSIS — I255 Ischemic cardiomyopathy: Secondary | ICD-10-CM | POA: Insufficient documentation

## 2015-03-27 DIAGNOSIS — G4733 Obstructive sleep apnea (adult) (pediatric): Secondary | ICD-10-CM | POA: Insufficient documentation

## 2015-04-02 ENCOUNTER — Ambulatory Visit: Payer: BLUE CROSS/BLUE SHIELD | Admitting: Family

## 2015-04-08 ENCOUNTER — Telehealth (HOSPITAL_COMMUNITY): Payer: Self-pay | Admitting: *Deleted

## 2015-04-08 ENCOUNTER — Encounter (HOSPITAL_COMMUNITY): Payer: Self-pay | Admitting: *Deleted

## 2015-04-08 NOTE — Telephone Encounter (Signed)
error 

## 2015-04-08 NOTE — Telephone Encounter (Signed)
Needed letter to return to work with light restrictions.  Letter is typed and awaiting to be signed by [facility]. Will call pt when letter is ready and pt will come pick it up

## 2015-04-09 NOTE — Telephone Encounter (Signed)
Letter mailed to pt.  

## 2015-04-13 ENCOUNTER — Telehealth (HOSPITAL_COMMUNITY): Payer: Self-pay | Admitting: *Deleted

## 2015-04-13 NOTE — Telephone Encounter (Signed)
Decrease lisinopril to 2.5 once a day, take at night.

## 2015-04-13 NOTE — Telephone Encounter (Signed)
Pt stated lisinopril is making him dizzy and he feels like hes "in a box" he is taking 2.5mg  twice a day.  Please advise.

## 2015-04-14 ENCOUNTER — Other Ambulatory Visit (HOSPITAL_COMMUNITY): Payer: Self-pay | Admitting: *Deleted

## 2015-04-14 MED ORDER — LISINOPRIL 2.5 MG PO TABS: 2.5000 mg | ORAL_TABLET | Freq: Every day | ORAL | Status: DC

## 2015-04-14 NOTE — Telephone Encounter (Signed)
Pt aware of medication change. Medication updated in patients chart

## 2015-04-16 ENCOUNTER — Other Ambulatory Visit: Payer: Self-pay | Admitting: *Deleted

## 2015-04-16 ENCOUNTER — Telehealth (HOSPITAL_COMMUNITY): Payer: Self-pay | Admitting: *Deleted

## 2015-04-16 DIAGNOSIS — I83813 Varicose veins of bilateral lower extremities with pain: Secondary | ICD-10-CM

## 2015-04-16 MED ORDER — ALPRAZOLAM 0.25 MG PO TABS
0.2500 mg | ORAL_TABLET | Freq: Two times a day (BID) | ORAL | Status: DC | PRN
Start: 2015-04-16 — End: 2015-07-15

## 2015-04-16 NOTE — Telephone Encounter (Signed)
Pt called concerned about the Lisinopril, he states he decreased the dose to once daily but still feels very anxious, depressed and almost like he's in a box.  Discussed w/Dr Aundra Dubin he states pt needs to be on the Lisinopril and he feels symptoms are from anxiety and not a SE of Lisinopril.  He would like pt to start Xanax 0.25 mg Twice daily as needed.  Discussed w/pt he is agreeable to try this, rx has been sent in, he will take Lis 2.5 at night and keep a check on his BP in AM, he will let us know if it runs high or if symptoms do not improve with Xamax.

## 2015-04-22 ENCOUNTER — Encounter (HOSPITAL_COMMUNITY): Payer: Self-pay | Admitting: *Deleted

## 2015-04-22 ENCOUNTER — Ambulatory Visit (HOSPITAL_COMMUNITY)
Admission: RE | Admit: 2015-04-22 | Discharge: 2015-04-22 | Disposition: A | Discharge status: Home / Self Care | Payer: BLUE CROSS/BLUE SHIELD | Source: Ambulatory Visit | Attending: Cardiology | Admitting: Cardiology

## 2015-04-22 VITALS — BP 110/82 | HR 67 | Wt 224.1 lb

## 2015-04-22 DIAGNOSIS — Z79899 Other long term (current) drug therapy: Secondary | ICD-10-CM | POA: Diagnosis not present

## 2015-04-22 DIAGNOSIS — Z87891 Personal history of nicotine dependence: Secondary | ICD-10-CM | POA: Insufficient documentation

## 2015-04-22 DIAGNOSIS — Z8249 Family history of ischemic heart disease and other diseases of the circulatory system: Secondary | ICD-10-CM | POA: Diagnosis not present

## 2015-04-22 DIAGNOSIS — I255 Ischemic cardiomyopathy: Secondary | ICD-10-CM | POA: Insufficient documentation

## 2015-04-22 DIAGNOSIS — I2510 Atherosclerotic heart disease of native coronary artery without angina pectoris: Secondary | ICD-10-CM | POA: Insufficient documentation

## 2015-04-22 DIAGNOSIS — I25110 Atherosclerotic heart disease of native coronary artery with unstable angina pectoris: Secondary | ICD-10-CM | POA: Diagnosis not present

## 2015-04-22 DIAGNOSIS — I252 Old myocardial infarction: Secondary | ICD-10-CM | POA: Diagnosis not present

## 2015-04-22 DIAGNOSIS — I10 Essential (primary) hypertension: Secondary | ICD-10-CM | POA: Diagnosis not present

## 2015-04-22 DIAGNOSIS — Z7982 Long term (current) use of aspirin: Secondary | ICD-10-CM | POA: Diagnosis not present

## 2015-04-22 DIAGNOSIS — E785 Hyperlipidemia, unspecified: Secondary | ICD-10-CM | POA: Diagnosis not present

## 2015-04-22 DIAGNOSIS — I5022 Chronic systolic (congestive) heart failure: Secondary | ICD-10-CM | POA: Insufficient documentation

## 2015-04-22 DIAGNOSIS — Z7902 Long term (current) use of antithrombotics/antiplatelets: Secondary | ICD-10-CM | POA: Insufficient documentation

## 2015-04-22 DIAGNOSIS — Z955 Presence of coronary angioplasty implant and graft: Secondary | ICD-10-CM | POA: Diagnosis not present

## 2015-04-22 MED ORDER — CARVEDILOL 6.25 MG PO TABS: 6.2500 mg | ORAL_TABLET | Freq: Two times a day (BID) | ORAL | Status: DC

## 2015-04-22 MED ORDER — CLOPIDOGREL BISULFATE 75 MG PO TABS: 75.0000 mg | ORAL_TABLET | Freq: Every day | ORAL | Status: DC

## 2015-04-22 MED ORDER — ROSUVASTATIN CALCIUM 10 MG PO TABS: 10.0000 mg | ORAL_TABLET | Freq: Every day | ORAL | Status: DC

## 2015-04-22 MED ORDER — COQ10 200 MG PO CAPS: 200.0000 mg | ORAL_CAPSULE | Freq: Every day | ORAL | Status: DC

## 2015-04-22 NOTE — Patient Instructions (Signed)
Stop Brilinta  Start Plavix 150 mg for one day then 75 mg daily  Start Crestor 10 mg daily  Start COQ10 200 mg daily  Increase Carvedilol 6.25 mg twice a day  Follow up 1 month

## 2015-04-22 NOTE — Progress Notes (Signed)
Advanced Heart Failure Medication Review by a Pharmacist  Does the patient  feel that his/her medications are working for him/her?  yes  Has the patient been experiencing any side effects to the medications prescribed?  Yes -   Does the patient measure his/her own blood pressure or blood glucose at home?  yes   Does the patient have any problems obtaining medications due to transportation or finances?   no  Understanding of regimen: good Understanding of indications: good Potential of compliance: good Patient understands to avoid NSAIDs. Patient understands to avoid decongestants.  Issues to address at subsequent visits: Tolerance of statin and P2Y12   Pharmacist comments:  William Contreras is a pleasant 59 yo M presenting with his wife and with a current medication list. He reports good compliance with his medications but states that he stopped taking his Lipitor shortly after initiation 2/2 headaches and joint/muscle pains. We discussed the beneficial effects of this class of medication and he is willing to try Crestor. He also states that his William Contreras is causing him to feel like his head is "in a box". Brilinta carries a 2-7% risk of headache, dizziness, fatigue, and vertigo which may be what he is experiencing. This risk is also present with Effient use. He would like to speak with Dr. Aundra Dubin about switching to Plavix. We did discuss the differences in outcomes between the two agents and he verbalized understanding. He did not have any other medication-related questions or concerns for me at this time.   Ruta Hinds. Velva Harman, PharmD, BCPS, CPP Clinical Pharmacist Pager: [phone] Phone: [phone] 04/22/2015 4:05 PM   Time with patient: 10 minutes Preparation and documentation time: 4 minutes Total time: 14 minutes

## 2015-04-23 DIAGNOSIS — I5022 Chronic systolic (congestive) heart failure: Secondary | ICD-10-CM | POA: Insufficient documentation

## 2015-04-23 NOTE — Progress Notes (Signed)
Patient ID: William Contreras, male   DOB: March 31, 1956, 59 y.o.   MRN: [id number] PCP: None Cardiology: Dr. Aundra Dubin  59 yo with history of HTN was admitted in 8/16 with acute systolic CHF => volume overloaded, short of breath.  Troponin was mildly elevated to 0.04.  Echo showed EF 30-35%.  He was diuresed and had cardiac cath.  This showed diffuse severe disease but the LAD disease was relatively mild to CABG not recommended.  He had PCI with DES to mLCx and proximal to mid RCA.    Since last appointment, he stopped his atorvastatin due to muscle aches.  These resolved.  Main complaint now is an unusual feeling most of the time, feels like he "is in a box."  He thinks this is due to [place], says that several people at cardiac rehab had this same problem with Brilinta.  Otherwise, he is doing well.  Feels good with no exertional dyspnea.  Walks 2.5 miles/day with no problem.  No chest pain. Doing cardiac rehab and [facility].   ECG; NSR, LBBB (132 msec)  Labs (8/16): K 4.1, creatinine 1.0, HCT 42.6, LDL 200 Labs (9/16): K 4, creatinine 0.94, BNP 383.5  PMH: 1. HTN 2. CAD: NSTEMI/CHF in 8/16.  LHC (8/16) showed 40-50% mLAD, 50% prox ramus, 80% mLCx, total occlusion moderate OM2, 50% pRCA, 80% mRCA, 50% dRCA.  He had DES to prox and mid RCA and mid LCx.   3. Chronic systolic CHF: Ischemic cardiomyopathy.  Echo (8/16) with EF 30-35%, diffuse hypokinesis, mild LVH, ?bicuspid aortic valve with mild AS and mild AI, 5.1 cm aortic root.  4. Hyperlipidemia: Myalgias with atorvastatin.   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  Lives in [place].   FH: No CAD that he knows of. +HTN.  ROS: All systems reviewed and negative except as per HPI.   Current Outpatient Prescriptions  Medication Sig Dispense Refill  . ALPRAZolam (XANAX) 0.25 MG tablet Take 1 tablet (0.25 mg total) by mouth 2 (two) times daily as needed for anxiety. 30 tablet 2  . aspirin EC 81 MG  tablet Take 81 mg by mouth daily.    . calcium citrate-vitamin D (CITRACAL+D) 315-200 MG-UNIT tablet Take 1 tablet by mouth daily.    . Flaxseed, Linseed, (FLAXSEED OIL) 1000 MG CAPS Take 1,000 mg by mouth daily.    . fluticasone (FLONASE) 50 MCG/ACT nasal spray Place 2 sprays into both nostrils daily as needed for allergies or rhinitis.    . furosemide (LASIX) 20 MG tablet Take 1 tablet (20 mg total) by mouth daily. 30 tablet 3  . Iron-Vitamins (GERITOL COMPLETE) TABS Take 1 tablet by mouth daily.    . Lactobacillus (ULTIMATE PROBIOTIC FORMULA) CAPS Take 1 capsule by mouth daily.    Marland Kitchen lisinopril (PRINIVIL,ZESTRIL) 2.5 MG tablet Take 1 tablet (2.5 mg total) by mouth at bedtime. 30 tablet 3  . Magnesium 400 MG TABS Take 400 mg by mouth daily.    . Multiple Vitamin (MULTIVITAMIN) tablet Take 1 tablet by mouth daily.    . Omega-3 Fatty Acids (FISH OIL) 1200 MG CAPS Take 1,200 mg by mouth daily.    Marland Kitchen spironolactone (ALDACTONE) 25 MG tablet Take 0.5 tablets (12.5 mg total) by mouth daily. 15 tablet 3  . carvedilol (COREG) 6.25 MG tablet Take 1 tablet (6.25 mg total) by mouth 2 (two) times daily. 180 tablet 3  . clopidogrel (PLAVIX) 75 MG tablet Take 1 tablet (75 mg total)  by mouth daily. 90 tablet 3  . Coenzyme Q10 (COQ10) 200 MG CAPS Take 200 mg by mouth daily. 30 capsule 2  . rosuvastatin (CRESTOR) 10 MG tablet Take 1 tablet (10 mg total) by mouth daily. 30 tablet 3   No current facility-administered medications for this encounter.   BP 110/82 mmHg  Pulse 67  Wt 224 lb 1.9 oz (101.66 kg)  SpO2 91% General: NAD Neck: Thick, no JVD, no thyromegaly or thyroid nodule.  Lungs: Clear to auscultation bilaterally with normal respiratory effort. CV: Nondisplaced PMI.  Heart regular S1/S2, no S3/S4, 2/6 early SEM RUSB.  Trace ankle edema.  No carotid bruit.  Normal pedal pulses.  Abdomen: Soft, nontender, no hepatosplenomegaly, no distention.  Skin: Intact without lesions or rashes.  Neurologic:  Alert and oriented x 3.  Psych: Normal affect. Extremities: No clubbing or cyanosis.  HEENT: Normal.   Assessment/Plan: 1. CAD: S/p ACS with DES to mLCx and proximal to mid RCA.  No chest pain.  He feels like he is "in a box" all the time and really wants to stop Brilinta.  I have not heard of this as a side effect.  I spoke to our pharmacist and it is a feasible side effect.  - Continue ASA 81.  - He has been on Brilinta about 57 weeks now.  I will stop it and start him on Plavix instead.   - He is off atorvastatin, will start him back on Crestor 10 mg daily.  - Continue cardiac rehab.   2. Chronic systolic CHF: EF 68-11% by echo in the hospital.  He decided against Lifevest.  NYHA class I-II symptoms.  - Continue current lisinopril and spironolactone.  - Increase Coreg to 6.25 mg bid. .  - 12/16 will do re-evaluation of EF for ?ICD (borderline QRS for CRT).  Will have him get a cardiac MRI (see below).  3. ?Bicuspid aortic valve with mild AI and mild AS: Hard to tell number of leaflets by echo.  I will arrange for cardiac MRI with MRA of chest in 12/16.  This will allow [place] to assess EF for ICD, assess for bicuspid aortic valve, and also to assess dilatation of the thoracic aorta (aortic root was 5.1 cm by echo).   4. Hyperlipidemia: LDL was very high.  Now off atorvastatin because of myalgias.  I will try him on Crestor 10 mg daily with lipids/LFTs in 2 months. He will coenzyme Q10 200 mg daily. 5. OSA: Suspected.  He was referred for sleep study.   Loralie Champagne 04/23/2015

## 2015-04-24 ENCOUNTER — Telehealth (HOSPITAL_COMMUNITY): Payer: Self-pay

## 2015-04-24 NOTE — Telephone Encounter (Signed)
Pamala Hurry from Urgent Care [phone]) request records from heart cath.  Faxed to [phone])

## 2015-05-26 ENCOUNTER — Encounter: Payer: Self-pay | Admitting: Cardiology

## 2015-05-27 ENCOUNTER — Encounter (HOSPITAL_COMMUNITY): Payer: Self-pay

## 2015-05-27 ENCOUNTER — Ambulatory Visit (HOSPITAL_COMMUNITY)
Admission: RE | Admit: 2015-05-27 | Discharge: 2015-05-27 | Disposition: A | Discharge status: Home / Self Care | Payer: BLUE CROSS/BLUE SHIELD | Source: Ambulatory Visit | Attending: Cardiology | Admitting: Cardiology

## 2015-05-27 VITALS — BP 122/70 | HR 62 | Wt 224.5 lb

## 2015-05-27 DIAGNOSIS — Z79899 Other long term (current) drug therapy: Secondary | ICD-10-CM | POA: Diagnosis not present

## 2015-05-27 DIAGNOSIS — I255 Ischemic cardiomyopathy: Secondary | ICD-10-CM | POA: Insufficient documentation

## 2015-05-27 DIAGNOSIS — Z87891 Personal history of nicotine dependence: Secondary | ICD-10-CM | POA: Insufficient documentation

## 2015-05-27 DIAGNOSIS — Z7982 Long term (current) use of aspirin: Secondary | ICD-10-CM | POA: Insufficient documentation

## 2015-05-27 DIAGNOSIS — Z955 Presence of coronary angioplasty implant and graft: Secondary | ICD-10-CM | POA: Insufficient documentation

## 2015-05-27 DIAGNOSIS — E785 Hyperlipidemia, unspecified: Secondary | ICD-10-CM | POA: Diagnosis not present

## 2015-05-27 DIAGNOSIS — I2510 Atherosclerotic heart disease of native coronary artery without angina pectoris: Secondary | ICD-10-CM | POA: Diagnosis not present

## 2015-05-27 DIAGNOSIS — I110 Hypertensive heart disease with heart failure: Secondary | ICD-10-CM | POA: Insufficient documentation

## 2015-05-27 DIAGNOSIS — I5022 Chronic systolic (congestive) heart failure: Secondary | ICD-10-CM | POA: Diagnosis not present

## 2015-05-27 DIAGNOSIS — Z7902 Long term (current) use of antithrombotics/antiplatelets: Secondary | ICD-10-CM | POA: Diagnosis not present

## 2015-05-27 DIAGNOSIS — I10 Essential (primary) hypertension: Secondary | ICD-10-CM | POA: Diagnosis present

## 2015-05-27 DIAGNOSIS — I252 Old myocardial infarction: Secondary | ICD-10-CM | POA: Diagnosis not present

## 2015-05-27 DIAGNOSIS — F419 Anxiety disorder, unspecified: Secondary | ICD-10-CM | POA: Diagnosis not present

## 2015-05-27 DIAGNOSIS — I25110 Atherosclerotic heart disease of native coronary artery with unstable angina pectoris: Secondary | ICD-10-CM

## 2015-05-27 MED ORDER — LISINOPRIL 5 MG PO TABS: 5.0000 mg | ORAL_TABLET | Freq: Every day | ORAL | Status: DC

## 2015-05-27 NOTE — Patient Instructions (Signed)
Stop Furosemide (Lasix)  Increase Lisinopril to 5 mg at bedtime  Labs in 2 weeks  Your physician has requested that you have a cardiac MRI. Cardiac MRI uses a computer to create images of your heart as its beating, producing both still and moving pictures of your heart and major blood vessels. For further information please visit [email or web address] Please follow the instruction sheet given to you today for more information.  Your physician recommends that you schedule a follow-up appointment in: 1 month

## 2015-05-28 NOTE — Progress Notes (Signed)
Patient ID: William Contreras, male   DOB: 1956-06-30, 59 y.o.   MRN: [id number] PCP: None Cardiology: Dr. Aundra Dubin  59 yo with history of HTN was admitted in 8/16 with acute systolic CHF => volume overloaded, short of breath.  Troponin was mildly elevated to 0.04.  Echo showed EF 30-35%.  He was diuresed and had cardiac cath.  This showed diffuse severe disease but the LAD disease was relatively mild to CABG not recommended.  He had PCI with DES to mLCx and proximal to mid RCA.    He is currently doing well.  Going to cardiac rehab.  Walks 3 miles on non-rehab days.   Watching diet and staying away from sodium.  No chest pain.  No exertional dyspnea.  No orthopnea/PND.  He continues to have significant anxiety.   ECG; NSR, LBBB (132 msec)  Labs (8/16): K 4.1, creatinine 1.0, HCT 42.6, LDL 200 Labs (9/16): K 4, creatinine 1.04, BNP 383  PMH: 1. HTN 2. CAD: NSTEMI/CHF in 8/16.  LHC (8/16) showed 40-50% mLAD, 50% prox ramus, 80% mLCx, total occlusion moderate OM2, 50% pRCA, 80% mRCA, 50% dRCA.  He had DES to prox and mid RCA and mid LCx.   3. Chronic systolic CHF: Ischemic cardiomyopathy.  Echo (8/16) with EF 30-35%, diffuse hypokinesis, mild LVH, ?bicuspid aortic valve with mild AS and mild AI, 5.1 cm aortic root.  4. 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   FH: No CAD that he knows of. +HTN.  ROS: All systems reviewed and negative except as per HPI.   Current Outpatient Prescriptions  Medication Sig Dispense Refill  . ALPRAZolam (XANAX) 0.25 MG tablet Take 1 tablet (0.25 mg total) by mouth 2 (two) times daily as needed for anxiety. 30 tablet 2  . aspirin EC 81 MG tablet Take 81 mg by mouth daily.    . calcium citrate-vitamin D (CITRACAL+D) 315-200 MG-UNIT tablet Take 1 tablet by mouth daily.    . carvedilol (COREG) 6.25 MG tablet Take 1 tablet (6.25 mg total) by mouth 2 (two) times daily. 180 tablet 3  . clopidogrel  (PLAVIX) 75 MG tablet Take 1 tablet (75 mg total) by mouth daily. 90 tablet 3  . Coenzyme Q10 (COQ10) 200 MG CAPS Take 200 mg by mouth daily. 30 capsule 2  . Flaxseed, Linseed, (FLAXSEED OIL) 1000 MG CAPS Take 1,000 mg by mouth daily.    . fluticasone (FLONASE) 50 MCG/ACT nasal spray Place 2 sprays into both nostrils daily as needed for allergies or rhinitis.    . Iron-Vitamins (GERITOL COMPLETE) TABS Take 1 tablet by mouth daily.    . Lactobacillus (ULTIMATE PROBIOTIC FORMULA) CAPS Take 1 capsule by mouth daily.    Marland Kitchen lisinopril (PRINIVIL,ZESTRIL) 5 MG tablet Take 1 tablet (5 mg total) by mouth at bedtime. 30 tablet 3  . Magnesium 400 MG TABS Take 400 mg by mouth daily.    . Omega-3 Fatty Acids (FISH OIL) 1200 MG CAPS Take 1,200 mg by mouth daily.    . rosuvastatin (CRESTOR) 10 MG tablet Take 1 tablet (10 mg total) by mouth daily. 30 tablet 3  . spironolactone (ALDACTONE) 25 MG tablet Take 0.5 tablets (12.5 mg total) by mouth daily. 15 tablet 3   No current facility-administered medications for this encounter.   BP 122/70 mmHg  Pulse 62  Wt 224 lb 8 oz (101.833 kg)  SpO2 99% General: NAD Neck: Thick, no JVD, no  thyromegaly or thyroid nodule.  Lungs: Clear to auscultation bilaterally with normal respiratory effort. CV: Nondisplaced PMI.  Heart regular S1/S2, no S3/S4, 2/6 early SEM RUSB.  No edema.  No carotid bruit.  Normal pedal pulses.  Abdomen: Soft, nontender, no hepatosplenomegaly, no distention.  Skin: Intact without lesions or rashes.  Neurologic: Alert and oriented x 3.  Psych: Normal affect. Extremities: No clubbing or cyanosis.  HEENT: Normal.   Assessment/Plan: 1. CAD: S/p ACS with DES to mLCx and proximal to mid RCA.  No chest pain.  - Continue ASA 81, Brilinta 90 mg bid - Continue atorvastatin.  - Continue cardiac rehab.  2. Chronic systolic CHF: EF 99991111 by echo in the hospital.  He decided against Lifevest.  NYHA class II symptoms.  - Continue current Coreg and  spironolactone.  - Increase lisinopril to 5 mg qhs.  - BMET in 2 wks.  - 12/16 will do re-evaluation of EF for ?ICD (borderline QRS for CRT).  Will have him get a cardiac MRI (see below).  3. ?Bicuspid aortic valve with mild AI and mild AS: Hard to tell number of leaflets by echo.  I will arrange for cardiac MRI with MRA of chest in 12/16.  This will allow [place] to assess EF for ICD, assess for bicuspid aortic valve, and also to assess dilatation of the thoracic aorta (aortic root was 5.1 cm by echo).   4. Hyperlipidemia: lipids/LFTs in 1/17.  5. OSA: Suspected.  Waiting for sleep study.  6. Anxiety: I will give him a low dose of Xanax short-term.  Followup in 4 wks for med titration.   Loralie Champagne 05/28/2015

## 2015-06-10 ENCOUNTER — Ambulatory Visit (HOSPITAL_COMMUNITY)
Admission: RE | Admit: 2015-06-10 | Discharge: 2015-06-10 | Disposition: A | Discharge status: Home / Self Care | Payer: BLUE CROSS/BLUE SHIELD | Source: Ambulatory Visit | Attending: Internal Medicine | Admitting: Internal Medicine

## 2015-06-10 DIAGNOSIS — I5022 Chronic systolic (congestive) heart failure: Secondary | ICD-10-CM | POA: Diagnosis not present

## 2015-06-10 LAB — BASIC METABOLIC PANEL
ANION GAP: 7 (ref 5–15)
BUN: 18 mg/dL (ref 6–20)
CHLORIDE: 102 mmol/L (ref 101–111)
CO2: 28 mmol/L (ref 22–32)
Calcium: 9.6 mg/dL (ref 8.9–10.3)
Creatinine, Ser: 0.99 mg/dL (ref 0.61–1.24)
Glucose, Bld: 94 mg/dL (ref 65–99)
POTASSIUM: 4.1 mmol/L (ref 3.5–5.1)
SODIUM: 137 mmol/L (ref 135–145)

## 2015-06-12 ENCOUNTER — Ambulatory Visit (HOSPITAL_BASED_OUTPATIENT_CLINIC_OR_DEPARTMENT_OTHER): Payer: BLUE CROSS/BLUE SHIELD

## 2015-06-18 ENCOUNTER — Encounter: Payer: Self-pay | Admitting: Cardiology

## 2015-06-22 ENCOUNTER — Encounter: Payer: Self-pay | Admitting: Cardiology

## 2015-06-23 ENCOUNTER — Encounter (HOSPITAL_COMMUNITY): Payer: BLUE CROSS/BLUE SHIELD

## 2015-06-23 ENCOUNTER — Encounter: Payer: BLUE CROSS/BLUE SHIELD | Admitting: Vascular Surgery

## 2015-06-24 ENCOUNTER — Other Ambulatory Visit (HOSPITAL_COMMUNITY): Payer: BLUE CROSS/BLUE SHIELD

## 2015-06-26 ENCOUNTER — Ambulatory Visit (HOSPITAL_COMMUNITY)
Admission: RE | Admit: 2015-06-26 | Discharge: 2015-06-26 | Disposition: A | Discharge status: Home / Self Care | Payer: BLUE CROSS/BLUE SHIELD | Source: Ambulatory Visit | Attending: Internal Medicine | Admitting: Internal Medicine

## 2015-06-26 DIAGNOSIS — I5022 Chronic systolic (congestive) heart failure: Secondary | ICD-10-CM

## 2015-06-26 LAB — HEPATIC FUNCTION PANEL
ALBUMIN: 3.9 g/dL (ref 3.5–5.0)
ALT: 17 U/L (ref 17–63)
AST: 19 U/L (ref 15–41)
Alkaline Phosphatase: 47 U/L (ref 38–126)
Bilirubin, Direct: <0.1 mg/dL — ABNORMAL LOW (ref 0.1–0.5)
TOTAL PROTEIN: 7.3 g/dL (ref 6.5–8.1)
Total Bilirubin: 0.8 mg/dL (ref 0.3–1.2)

## 2015-06-26 LAB — LIPID PANEL
Cholesterol: 236 mg/dL — ABNORMAL HIGH (ref 0–200)
HDL: 37 mg/dL — ABNORMAL LOW (ref >40)
LDL CALC: 152 mg/dL — AB (ref 0–99)
Total CHOL/HDL Ratio: 6.4 RATIO
Triglycerides: 233 mg/dL — ABNORMAL HIGH (ref <150)
VLDL: 47 mg/dL — AB (ref 0–40)

## 2015-06-29 ENCOUNTER — Other Ambulatory Visit (HOSPITAL_COMMUNITY): Payer: Self-pay | Admitting: *Deleted

## 2015-06-29 MED ORDER — ROSUVASTATIN CALCIUM 40 MG PO TABS: 40.0000 mg | ORAL_TABLET | Freq: Every day | ORAL | Status: DC

## 2015-06-30 ENCOUNTER — Encounter (HOSPITAL_COMMUNITY): Payer: BLUE CROSS/BLUE SHIELD

## 2015-06-30 ENCOUNTER — Ambulatory Visit (HOSPITAL_COMMUNITY)
Admission: RE | Admit: 2015-06-30 | Discharge: 2015-06-30 | Disposition: A | Discharge status: Home / Self Care | Payer: BLUE CROSS/BLUE SHIELD | Source: Ambulatory Visit | Attending: Cardiology | Admitting: Cardiology

## 2015-06-30 VITALS — BP 133/87 | HR 56 | Ht 71.0 in | Wt 223.1 lb

## 2015-06-30 DIAGNOSIS — I2510 Atherosclerotic heart disease of native coronary artery without angina pectoris: Secondary | ICD-10-CM | POA: Insufficient documentation

## 2015-06-30 DIAGNOSIS — G4733 Obstructive sleep apnea (adult) (pediatric): Secondary | ICD-10-CM | POA: Insufficient documentation

## 2015-06-30 DIAGNOSIS — I5022 Chronic systolic (congestive) heart failure: Secondary | ICD-10-CM | POA: Insufficient documentation

## 2015-06-30 DIAGNOSIS — E785 Hyperlipidemia, unspecified: Secondary | ICD-10-CM | POA: Insufficient documentation

## 2015-06-30 DIAGNOSIS — I25110 Atherosclerotic heart disease of native coronary artery with unstable angina pectoris: Secondary | ICD-10-CM

## 2015-06-30 MED ORDER — ROSUVASTATIN CALCIUM 10 MG PO TABS: 10.0000 mg | ORAL_TABLET | Freq: Every day | ORAL | Status: DC

## 2015-06-30 NOTE — Patient Instructions (Signed)
TAKE Crestor 10mg  daily.  LABS: 47months (lipids lfts)  FOLLOW UP: 63months with Dr.McLean

## 2015-07-02 NOTE — Progress Notes (Signed)
Patient ID: William Contreras, male   DOB: [birth year], 57 y.o.   MRN: [id number] PCP: None Cardiology: Dr. Aundra Dubin  59 yo with history of HTN was admitted in 8/16 with acute systolic CHF => volume overloaded, short of breath.  Troponin was mildly elevated to 0.04.  Echo showed EF 30-35%.  He was diuresed and had cardiac cath.  This showed diffuse severe disease but the LAD disease was relatively mild to CABG not recommended.  He had PCI with DES to mLCx and proximal to mid RCA.    He is currently doing well.  Going to cardiac rehab.  Walks 3 miles on non-rehab days.   Watching diet and staying away from sodium.  No chest pain.  No exertional dyspnea.  No orthopnea/PND.  He has stopped taking Crestor.  He was not having any side effects, just says he had "heard about how bad statins are for you."  SBP running 100s-120s at home.  He was cut his lisinopril back to 2.5 mg daily from 5 mg daily.  No particular side effects, just worried that the "dose was too strong."    ECG; NSR, LBBB (132 msec)  Labs (8/16): K 4.1, creatinine 1.0, HCT 42.6, LDL 200 Labs (9/16): K 4, creatinine 1.04, BNP 383 Labs (11/16): K 4.1, creatinine 0.99 Labs (12/16): LDL 152, HDL 37 (not taking Crestor)  PMH: 1. HTN 2. CAD: NSTEMI/CHF in 8/16.  LHC (8/16) showed 40-50% mLAD, 50% prox ramus, 80% mLCx, total occlusion moderate OM2, 50% pRCA, 80% mRCA, 50% dRCA.  He had DES to prox and mid RCA and mid LCx.   3. Chronic systolic CHF: Ischemic cardiomyopathy.  Echo (8/16) with EF 30-35%, diffuse hypokinesis, mild LVH, ?bicuspid aortic valve with mild AS and mild AI, 5.1 cm aortic root.  4. 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  Lives in [place].   FH: No CAD that he knows of. +HTN.  ROS: All systems reviewed and negative except as per HPI.   Current Outpatient Prescriptions  Medication Sig Dispense Refill  . ALPRAZolam (XANAX) 0.25 MG tablet Take 1 tablet (0.25 mg  total) by mouth 2 (two) times daily as needed for anxiety. 30 tablet 2  . aspirin EC 81 MG tablet Take 81 mg by mouth daily.    . calcium citrate-vitamin D (CITRACAL+D) 315-200 MG-UNIT tablet Take 1 tablet by mouth daily.    . carvedilol (COREG) 6.25 MG tablet Take 1 tablet (6.25 mg total) by mouth 2 (two) times daily. 180 tablet 3  . clopidogrel (PLAVIX) 75 MG tablet Take 1 tablet (75 mg total) by mouth daily. 90 tablet 3  . Coenzyme Q10 (COQ10) 200 MG CAPS Take 200 mg by mouth daily. 30 capsule 2  . Flaxseed, Linseed, (FLAXSEED OIL) 1000 MG CAPS Take 1,000 mg by mouth daily.    . fluticasone (FLONASE) 50 MCG/ACT nasal spray Place 2 sprays into both nostrils daily as needed for allergies or rhinitis.    . Iron-Vitamins (GERITOL COMPLETE) TABS Take 1 tablet by mouth daily.    . Lactobacillus (ULTIMATE PROBIOTIC FORMULA) CAPS Take 1 capsule by mouth daily.    Marland Kitchen lisinopril (PRINIVIL,ZESTRIL) 5 MG tablet Take 1 tablet (5 mg total) by mouth at bedtime. (Patient taking differently: Take 2.5 mg by mouth at bedtime. ) 30 tablet 3  . Magnesium 400 MG TABS Take 400 mg by mouth daily.    . Omega-3 Fatty Acids (FISH OIL) 1200 MG CAPS Take  1,200 mg by mouth daily.    Marland Kitchen spironolactone (ALDACTONE) 25 MG tablet Take 0.5 tablets (12.5 mg total) by mouth daily. 15 tablet 3  . rosuvastatin (CRESTOR) 10 MG tablet Take 1 tablet (10 mg total) by mouth daily. 30 tablet 3   No current facility-administered medications for this encounter.   BP 133/87 mmHg  Pulse 56  Ht 5\' 11"  (1.803 m)  Wt 223 lb 1.9 oz (101.207 kg)  BMI 31.13 kg/m2  SpO2 100% General: NAD Neck: Thick, no JVD, no thyromegaly or thyroid nodule.  Lungs: Clear to auscultation bilaterally with normal respiratory effort. CV: Nondisplaced PMI.  Heart regular S1/S2, no S3/S4, 2/6 early SEM RUSB.  No edema.  No carotid bruit.  Normal pedal pulses.  Abdomen: Soft, nontender, no hepatosplenomegaly, no distention.  Skin: Intact without lesions or rashes.   Neurologic: Alert and oriented x 3.  Psych: Normal affect. Extremities: No clubbing or cyanosis.  HEENT: Normal.   Assessment/Plan: 1. CAD: S/p ACS with DES to mLCx and proximal to mid RCA.  No chest pain.  - Continue ASA 81, Brilinta 90 mg bid - Restart Crestor at 10 mg daily.  We had a long talk about the importance of this medication.  His LDL is very high off statin. I think he understands the need to take it.   - Continue cardiac rehab.  2. Chronic systolic CHF: EF 57 by echo in the hospital.  He decided against [facility].  NYHA class II symptoms.  - Continue current Coreg and spironolactone.  - Decided to keep him on lisinopril at 2.5 mg daily rather than trying to convince him to increase it back to 5 mg daily in order to concentrate on convincing him to take his statin.   - Later this month will do re-evaluation of EF for ?ICD (borderline QRS for CRT).  Will have him get a cardiac MRI (see below).  3. ?Bicuspid aortic valve with mild AI and mild AS: Hard to tell number of leaflets by echo.  I will arrange for cardiac MRI with MRA of chest, this is scheduled for 12/29.  This will allow [place] to assess EF for ICD, assess for bicuspid aortic valve, and also to assess dilatation of the thoracic aorta (aortic root was 5.1 cm by echo).   4. Hyperlipidemia: Check lipids/LFTs in 2 months.  5. OSA: Suspected.  Awaiting results of sleep study.   Loralie Champagne 07/02/2015

## 2015-07-08 ENCOUNTER — Encounter: Payer: Self-pay | Admitting: Cardiology

## 2015-07-09 ENCOUNTER — Ambulatory Visit (HOSPITAL_COMMUNITY): Payer: BLUE CROSS/BLUE SHIELD

## 2015-07-09 ENCOUNTER — Ambulatory Visit (HOSPITAL_COMMUNITY)
Admission: RE | Admit: 2015-07-09 | Discharge: 2015-07-09 | Disposition: A | Discharge status: Home / Self Care | Payer: BLUE CROSS/BLUE SHIELD | Source: Ambulatory Visit | Attending: Cardiology | Admitting: Cardiology

## 2015-07-09 DIAGNOSIS — I77810 Thoracic aortic ectasia: Secondary | ICD-10-CM | POA: Insufficient documentation

## 2015-07-09 DIAGNOSIS — I2510 Atherosclerotic heart disease of native coronary artery without angina pectoris: Secondary | ICD-10-CM | POA: Insufficient documentation

## 2015-07-09 DIAGNOSIS — I255 Ischemic cardiomyopathy: Secondary | ICD-10-CM | POA: Diagnosis not present

## 2015-07-09 MED ORDER — GADOBENATE DIMEGLUMINE 529 MG/ML IV SOLN
35.0000 mL | Freq: Once | INTRAVENOUS | Status: AC | PRN
  Administered 2015-07-09: 35 mL via INTRAVENOUS

## 2015-07-15 ENCOUNTER — Other Ambulatory Visit (HOSPITAL_COMMUNITY): Payer: Self-pay | Admitting: *Deleted

## 2015-07-15 ENCOUNTER — Telehealth (HOSPITAL_COMMUNITY): Payer: Self-pay | Admitting: Vascular Surgery

## 2015-07-15 ENCOUNTER — Telehealth: Payer: Self-pay | Admitting: *Deleted

## 2015-07-15 DIAGNOSIS — I350 Nonrheumatic aortic (valve) stenosis: Secondary | ICD-10-CM

## 2015-07-15 MED ORDER — ALPRAZOLAM 0.25 MG PO TABS: 0.2500 mg | ORAL_TABLET | Freq: Two times a day (BID) | ORAL | Status: DC | PRN

## 2015-07-15 NOTE — Telephone Encounter (Addendum)
Pt needs refill o Xanax, and results for sleep study

## 2015-07-15 NOTE — Telephone Encounter (Signed)
Pt needs to contact PCP for Xanax

## 2015-07-16 NOTE — Telephone Encounter (Signed)
Pt aware rx was sent in, he was given results of recent cMRI, he is aware we will call him to sch echo

## 2015-07-17 ENCOUNTER — Telehealth (HOSPITAL_COMMUNITY): Payer: Self-pay | Admitting: Vascular Surgery

## 2015-07-17 NOTE — Telephone Encounter (Signed)
Left pt detailed message about his echo appt @ [address] office 07/31/15 @ 10:30

## 2015-07-31 ENCOUNTER — Other Ambulatory Visit (HOSPITAL_COMMUNITY): Payer: BLUE CROSS/BLUE SHIELD

## 2015-08-04 ENCOUNTER — Encounter: Payer: Self-pay | Admitting: *Deleted

## 2015-08-04 ENCOUNTER — Other Ambulatory Visit: Payer: Self-pay | Admitting: *Deleted

## 2015-08-04 DIAGNOSIS — G4733 Obstructive sleep apnea (adult) (pediatric): Secondary | ICD-10-CM

## 2015-08-06 ENCOUNTER — Telehealth (HOSPITAL_COMMUNITY): Payer: Self-pay | Admitting: Cardiology

## 2015-08-06 NOTE — Telephone Encounter (Signed)
Patient called to request refill on xanax

## 2015-08-06 NOTE — Telephone Encounter (Signed)
Ok for once but needs from PCP in future.

## 2015-08-07 ENCOUNTER — Other Ambulatory Visit (HOSPITAL_COMMUNITY): Payer: Self-pay | Admitting: *Deleted

## 2015-08-07 MED ORDER — ALPRAZOLAM 0.25 MG PO TABS: 0.2500 mg | ORAL_TABLET | Freq: Two times a day (BID) | ORAL | Status: DC | PRN

## 2015-08-07 NOTE — Telephone Encounter (Signed)
rx called into pharmacy

## 2015-09-04 ENCOUNTER — Other Ambulatory Visit (HOSPITAL_COMMUNITY): Payer: BLUE CROSS/BLUE SHIELD

## 2015-09-18 ENCOUNTER — Encounter (HOSPITAL_COMMUNITY): Payer: Self-pay | Admitting: Cardiology

## 2015-09-18 ENCOUNTER — Ambulatory Visit (HOSPITAL_COMMUNITY)
Admission: RE | Admit: 2015-09-18 | Discharge: 2015-09-18 | Disposition: A | Discharge status: Home / Self Care | Payer: BLUE CROSS/BLUE SHIELD | Source: Ambulatory Visit | Attending: Internal Medicine | Admitting: Internal Medicine

## 2015-09-18 DIAGNOSIS — I5022 Chronic systolic (congestive) heart failure: Secondary | ICD-10-CM | POA: Insufficient documentation

## 2015-09-18 LAB — HEPATIC FUNCTION PANEL
ALBUMIN: 4 g/dL (ref 3.5–5.0)
ALK PHOS: 41 U/L (ref 38–126)
ALT: 17 U/L (ref 17–63)
AST: 21 U/L (ref 15–41)
BILIRUBIN DIRECT: 0.1 mg/dL (ref 0.1–0.5)
BILIRUBIN TOTAL: 1 mg/dL (ref 0.3–1.2)
Indirect Bilirubin: 0.9 mg/dL (ref 0.3–0.9)
Total Protein: 7 g/dL (ref 6.5–8.1)

## 2015-09-18 LAB — CHOLESTEROL, TOTAL: CHOLESTEROL: 152 mg/dL (ref 0–200)

## 2015-09-21 ENCOUNTER — Encounter (HOSPITAL_COMMUNITY): Payer: Self-pay | Admitting: Cardiology

## 2015-09-27 ENCOUNTER — Ambulatory Visit (HOSPITAL_BASED_OUTPATIENT_CLINIC_OR_DEPARTMENT_OTHER): Payer: BLUE CROSS/BLUE SHIELD

## 2015-09-29 ENCOUNTER — Other Ambulatory Visit (HOSPITAL_COMMUNITY): Payer: Self-pay | Admitting: Cardiology

## 2015-10-06 ENCOUNTER — Telehealth (HOSPITAL_COMMUNITY): Payer: Self-pay | Admitting: Vascular Surgery

## 2015-10-06 NOTE — Telephone Encounter (Signed)
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 Please advise

## 2015-10-08 ENCOUNTER — Encounter (HOSPITAL_COMMUNITY): Payer: Self-pay | Admitting: *Deleted

## 2015-10-08 ENCOUNTER — Encounter (HOSPITAL_COMMUNITY): Payer: Self-pay

## 2015-10-08 ENCOUNTER — Ambulatory Visit (HOSPITAL_COMMUNITY)
Admission: RE | Admit: 2015-10-08 | Discharge: 2015-10-08 | Disposition: A | Discharge status: Home / Self Care | Payer: BLUE CROSS/BLUE SHIELD | Source: Ambulatory Visit | Attending: Cardiology | Admitting: Cardiology

## 2015-10-08 VITALS — BP 128/68 | HR 88 | Wt 215.4 lb

## 2015-10-08 DIAGNOSIS — Z955 Presence of coronary angioplasty implant and graft: Secondary | ICD-10-CM | POA: Diagnosis not present

## 2015-10-08 DIAGNOSIS — I255 Ischemic cardiomyopathy: Secondary | ICD-10-CM | POA: Diagnosis not present

## 2015-10-08 DIAGNOSIS — Z79899 Other long term (current) drug therapy: Secondary | ICD-10-CM | POA: Diagnosis not present

## 2015-10-08 DIAGNOSIS — I252 Old myocardial infarction: Secondary | ICD-10-CM | POA: Insufficient documentation

## 2015-10-08 DIAGNOSIS — I712 Thoracic aortic aneurysm, without rupture: Secondary | ICD-10-CM | POA: Diagnosis not present

## 2015-10-08 DIAGNOSIS — Z7982 Long term (current) use of aspirin: Secondary | ICD-10-CM | POA: Diagnosis not present

## 2015-10-08 DIAGNOSIS — I2510 Atherosclerotic heart disease of native coronary artery without angina pectoris: Secondary | ICD-10-CM | POA: Insufficient documentation

## 2015-10-08 DIAGNOSIS — I110 Hypertensive heart disease with heart failure: Secondary | ICD-10-CM | POA: Insufficient documentation

## 2015-10-08 DIAGNOSIS — Z7902 Long term (current) use of antithrombotics/antiplatelets: Secondary | ICD-10-CM | POA: Diagnosis not present

## 2015-10-08 DIAGNOSIS — E785 Hyperlipidemia, unspecified: Secondary | ICD-10-CM | POA: Insufficient documentation

## 2015-10-08 DIAGNOSIS — Q231 Congenital insufficiency of aortic valve: Secondary | ICD-10-CM | POA: Diagnosis not present

## 2015-10-08 DIAGNOSIS — Z87891 Personal history of nicotine dependence: Secondary | ICD-10-CM | POA: Diagnosis not present

## 2015-10-08 DIAGNOSIS — I25110 Atherosclerotic heart disease of native coronary artery with unstable angina pectoris: Secondary | ICD-10-CM | POA: Diagnosis not present

## 2015-10-08 DIAGNOSIS — I5022 Chronic systolic (congestive) heart failure: Secondary | ICD-10-CM | POA: Insufficient documentation

## 2015-10-08 LAB — BASIC METABOLIC PANEL
Anion gap: 11 (ref 5–15)
BUN: 15 mg/dL (ref 6–20)
CHLORIDE: 103 mmol/L (ref 101–111)
CO2: 22 mmol/L (ref 22–32)
CREATININE: 0.92 mg/dL (ref 0.61–1.24)
Calcium: 9.3 mg/dL (ref 8.9–10.3)
GFR calc Af Amer: >60 mL/min (ref >60)
GFR calc non Af Amer: >60 mL/min (ref >60)
Glucose, Bld: 91 mg/dL (ref 65–99)
Potassium: 4.1 mmol/L (ref 3.5–5.1)
SODIUM: 136 mmol/L (ref 135–145)

## 2015-10-08 LAB — LIPID PANEL
CHOL/HDL RATIO: 4.5 ratio
Cholesterol: 163 mg/dL (ref 0–200)
HDL: 36 mg/dL — AB (ref >40)
LDL Cholesterol: 92 mg/dL (ref 0–99)
Triglycerides: 175 mg/dL — ABNORMAL HIGH (ref <150)
VLDL: 35 mg/dL (ref 0–40)

## 2015-10-08 NOTE — Patient Instructions (Signed)
Take Lisinopril 2.5mg  daily  Routine lab work today. Will notify you of abnormal results  Repeat bmet in  2 weeks with PCP. Please have them fax results to [phone]  Follow up with Dr.McLean in 3 months.

## 2015-10-09 ENCOUNTER — Encounter (HOSPITAL_BASED_OUTPATIENT_CLINIC_OR_DEPARTMENT_OTHER): Payer: BLUE CROSS/BLUE SHIELD

## 2015-10-09 DIAGNOSIS — Q231 Congenital insufficiency of aortic valve: Secondary | ICD-10-CM | POA: Insufficient documentation

## 2015-10-09 NOTE — Progress Notes (Signed)
Patient ID: William Contreras, male   DOB: [birth year], 58 y.o.   MRN: [id number] PCP: Dr. Lin Landsman Cardiology: Dr. Aundra Dubin  60 yo with history of HTN was admitted in 8/16 with acute systolic CHF => volume overloaded, short of breath.  Troponin was mildly elevated to 0.04.  Echo showed EF 30-35%.  He was diuresed and had cardiac cath.  This showed diffuse severe disease but the LAD disease was relatively mild to CABG not recommended.  He had PCI with DES to mLCx and proximal to mid RCA.  Cardiac MRI/MRA chest showed EF 39%, bicuspid aortic valve with probable moderate aortic stenosis, 4.9 cm ascending aorta.   He is currently doing well.  Taking all his meds.  No chest pain.  No exertional dyspnea except walking up a steep hill.  Exercising regularly.  He is back at work full time.  He has lost 8 lbs.   ECG: NSR, IVCD 128 msec  Labs (8/16): K 4.1, creatinine 1.0, HCT 42.6, LDL 200 Labs (9/16): K 4, creatinine 1.04, BNP 383 Labs (11/16): K 4.1, creatinine 0.99 Labs (12/16): LDL 152, HDL 37 (not taking Crestor)  PMH: 1. HTN 2. CAD: NSTEMI/CHF in 8/16.  LHC (8/16) showed 40-50% mLAD, 50% prox ramus, 80% mLCx, total occlusion moderate OM2, 50% pRCA, 80% mRCA, 50% dRCA.  He had DES to prox and mid RCA and mid LCx.   3. Chronic systolic CHF: Ischemic cardiomyopathy.   - Echo (8/16) with EF 30-35%, diffuse hypokinesis, mild LVH, ?bicuspid aortic valve with mild AS and mild AI, 5.1 cm aortic root.  - Cardiac MRI (12/16): EF 39%, mild LV dilation and moderate LVH, inferior/inferolateral/anterolateral hypokinesis, LGE in the inferior and inferolateral walls, normal RV size and systolic function, mild aortic insufficiency, bicuspid aortic valve with probable moderate aortic stenosis.  4. Hyperlipidemia 5. Bicuspid aortic valve: Moderate AS by 12/16 MRI, mild AS by 8/16 echo.  6. Ascending aortic aneurysm: 4.9 cm ascending aorta by MRA chest in 12/16.   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  Lives in [place].   FH: No CAD that he knows of. +HTN.  ROS: All systems reviewed and negative except as per HPI.   Current Outpatient Prescriptions  Medication Sig Dispense Refill  . aspirin EC 81 MG tablet Take 81 mg by mouth daily.    . calcium citrate-vitamin D (CITRACAL+D) 315-200 MG-UNIT tablet Take 1 tablet by mouth daily.    . carvedilol (COREG) 6.25 MG tablet Take 1 tablet (6.25 mg total) by mouth 2 (two) times daily. 180 tablet 3  . clopidogrel (PLAVIX) 75 MG tablet Take 1 tablet (75 mg total) by mouth daily. 90 tablet 3  . Coenzyme Q10 (COQ10) 200 MG CAPS Take 200 mg by mouth daily. 30 capsule 2  . Flaxseed, Linseed, (FLAXSEED OIL) 1000 MG CAPS Take 1,000 mg by mouth daily.    . fluticasone (FLONASE) 50 MCG/ACT nasal spray Place 2 sprays into both nostrils daily as needed for allergies or rhinitis.    . Iron-Vitamins (GERITOL COMPLETE) TABS Take 1 tablet by mouth daily.    . Lactobacillus (ULTIMATE PROBIOTIC FORMULA) CAPS Take 1 capsule by mouth daily.    Marland Kitchen lisinopril (PRINIVIL,ZESTRIL) 2.5 MG tablet Take 2.5 mg by mouth daily.    . Magnesium 400 MG TABS Take 400 mg by mouth daily.    . Omega-3 Fatty Acids (FISH OIL) 1200 MG CAPS Take 1,200 mg by mouth daily.    . rosuvastatin (  CRESTOR) 10 MG tablet Take 1 tablet (10 mg total) by mouth daily. 30 tablet 3  . spironolactone (ALDACTONE) 25 MG tablet TAKE ONE-HALF TABLET BY MOUTH DAILY 15 tablet 0   No current facility-administered medications for this encounter.   BP 128/68 mmHg  Pulse 88  Wt 215 lb 6.4 oz (97.705 kg)  SpO2 98% General: NAD Neck: Thick, no JVD, no thyromegaly or thyroid nodule.  Lungs: Clear to auscultation bilaterally with normal respiratory effort. CV: Nondisplaced PMI.  Heart regular S1/S2, no S3/S4, 2/6 early SEM RUSB.  No edema.  No carotid bruit.  Normal pedal pulses.  Abdomen: Soft, nontender, no hepatosplenomegaly, no distention.  Skin: Intact without lesions or rashes.   Neurologic: Alert and oriented x 3.  Psych: Normal affect. Extremities: No clubbing or cyanosis.  HEENT: Normal.   Assessment/Plan: 1. CAD: S/p ACS with DES to mLCx and proximal to mid RCA.  No chest pain.  - Continue ASA 81, Brilinta 90 mg bid - Continue Crestor, he is now taking this regularly.    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  He has a LBBB-like IVCD, so will arrange for a Lexiscan Cardiolite.  2. Chronic systolic CHF: Ischemic cardiomyopathy.  EF 30-35% by echo in the hospital.  Cardiac MRI showed EF up to 39% in 12/16.  NYHA class II symptoms.  - Continue current Coreg and spironolactone.  - Increase lisinopril to 2.5 mg bid with BMET in 2 wks.    3. Bicuspid aortic valve: Mild AI, moderate AS by cMRI in 12/16, mild AS by echo in 8/16.  I will get a repeat echo in 12/17 to reassess. 4. Hyperlipidemia: Check lipids/LFTs today.   5. OSA: Sleep study has been scheduled.  6. Ascending aortic aneurysm: 4.9 cm on MRA chest in 12/16.  Likely related to bicuspid aortic valve.  Will repeat MRA chest in 12/17.   Loralie Champagne 10/09/2015

## 2015-10-13 ENCOUNTER — Other Ambulatory Visit (HOSPITAL_COMMUNITY): Payer: Self-pay | Admitting: Cardiology

## 2015-10-14 ENCOUNTER — Telehealth (HOSPITAL_COMMUNITY): Payer: Self-pay | Admitting: *Deleted

## 2015-10-14 NOTE — Telephone Encounter (Signed)
Left message on voicemail in reference to upcoming appointment scheduled for 10/19/15. Phone number given for a call back so details instructions can be given. Hubbard Robinson, RN

## 2015-10-16 ENCOUNTER — Encounter (HOSPITAL_BASED_OUTPATIENT_CLINIC_OR_DEPARTMENT_OTHER): Payer: BLUE CROSS/BLUE SHIELD

## 2015-10-16 ENCOUNTER — Telehealth (HOSPITAL_COMMUNITY): Payer: Self-pay | Admitting: Radiology

## 2015-10-16 NOTE — Telephone Encounter (Signed)
Patient given detailed instructions per Myocardial Perfusion Study Information Sheet for the test on 10/19/2015 at 9:45. Patient notified to arrive 15 minutes early and that it is imperative to arrive on time for appointment to keep from having the test rescheduled.  If you need to cancel or reschedule your appointment, please call the office within 24 hours of your appointment. Failure to do so may result in a cancellation of your appointment, and a $50 no show fee. Patient verbalized understanding.EHK

## 2015-10-19 ENCOUNTER — Encounter (HOSPITAL_COMMUNITY): Payer: BLUE CROSS/BLUE SHIELD

## 2015-10-21 ENCOUNTER — Telehealth (HOSPITAL_COMMUNITY): Payer: Self-pay | Admitting: *Deleted

## 2015-10-21 NOTE — Telephone Encounter (Signed)
Left message on voicemail in reference to upcoming appointment scheduled for 10/26/15 Phone number given for a call back so details instructions can be given. Hubbard Robinson, RN

## 2015-10-23 ENCOUNTER — Encounter (HOSPITAL_BASED_OUTPATIENT_CLINIC_OR_DEPARTMENT_OTHER): Payer: BLUE CROSS/BLUE SHIELD

## 2015-10-23 ENCOUNTER — Ambulatory Visit (HOSPITAL_COMMUNITY): Payer: BLUE CROSS/BLUE SHIELD | Attending: Cardiology

## 2015-10-23 DIAGNOSIS — I5022 Chronic systolic (congestive) heart failure: Secondary | ICD-10-CM | POA: Insufficient documentation

## 2015-10-23 DIAGNOSIS — I110 Hypertensive heart disease with heart failure: Secondary | ICD-10-CM | POA: Insufficient documentation

## 2015-10-23 DIAGNOSIS — I447 Left bundle-branch block, unspecified: Secondary | ICD-10-CM | POA: Diagnosis not present

## 2015-10-23 DIAGNOSIS — R9439 Abnormal result of other cardiovascular function study: Secondary | ICD-10-CM | POA: Diagnosis not present

## 2015-10-23 DIAGNOSIS — R0609 Other forms of dyspnea: Secondary | ICD-10-CM | POA: Insufficient documentation

## 2015-10-23 LAB — MYOCARDIAL PERFUSION IMAGING
CHL CUP NUCLEAR SDS: 2
CHL CUP RESTING HR STRESS: 48 {beats}/min
LV sys vol: 174 mL
LVDIAVOL: 257 mL (ref 62–150)
Peak HR: 71 {beats}/min
RATE: 0.37
SRS: 8
SSS: 10
TID: 1.03

## 2015-10-23 MED ORDER — REGADENOSON 0.4 MG/5ML IV SOLN
0.4000 mg | Freq: Once | INTRAVENOUS | Status: AC
  Administered 2015-10-23: 0.4 mg via INTRAVENOUS

## 2015-10-23 MED ORDER — TECHNETIUM TC 99M SESTAMIBI GENERIC - CARDIOLITE
33.0000 | Freq: Once | INTRAVENOUS | Status: AC | PRN
  Administered 2015-10-23: 33 via INTRAVENOUS

## 2015-10-23 MED ORDER — TECHNETIUM TC 99M SESTAMIBI GENERIC - CARDIOLITE
10.5000 | Freq: Once | INTRAVENOUS | Status: AC | PRN
  Administered 2015-10-23: 11 via INTRAVENOUS

## 2015-10-26 ENCOUNTER — Telehealth (HOSPITAL_COMMUNITY): Payer: Self-pay | Admitting: Cardiology

## 2015-10-26 DIAGNOSIS — I5022 Chronic systolic (congestive) heart failure: Secondary | ICD-10-CM

## 2015-10-26 NOTE — Telephone Encounter (Signed)
I have signed off on his stress test.  Please see my note from last week attached to the study.  He can return to work.

## 2015-10-26 NOTE — Telephone Encounter (Signed)
Pt states he needs results and ok to return to work, will send to Dr Aundra Dubin for review

## 2015-10-26 NOTE — Telephone Encounter (Signed)
Patient/patients wofe called with request to have stress test results Advised- results have not been signed by MD as of

## 2015-10-28 ENCOUNTER — Other Ambulatory Visit (HOSPITAL_COMMUNITY): Payer: Self-pay | Admitting: Cardiology

## 2015-10-28 NOTE — Telephone Encounter (Signed)
Patient aware and copy of OV and study left for patient in the front office

## 2015-10-29 NOTE — Addendum Note (Signed)
Addended by: Scarlette Calico on: 10/29/2015 09:55 AM   Modules accepted: Orders

## 2015-10-29 NOTE — Telephone Encounter (Signed)
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  On MRI in Dec it was 39% and myoview this week was 32%, he really would like to go back to work and wants to know what can be done.  Discussed w/Dr Aundra Dubin this AM, he would like pt to get an echo ASAP.  Echo sch for tom at [facility] at 10 am, pt aware and agreeable

## 2015-10-30 ENCOUNTER — Ambulatory Visit (HOSPITAL_COMMUNITY)
Admission: RE | Admit: 2015-10-30 | Discharge: 2015-10-30 | Disposition: A | Discharge status: Home / Self Care | Payer: BLUE CROSS/BLUE SHIELD | Source: Ambulatory Visit | Attending: Cardiology | Admitting: Cardiology

## 2015-10-30 ENCOUNTER — Other Ambulatory Visit (HOSPITAL_COMMUNITY): Payer: BLUE CROSS/BLUE SHIELD

## 2015-10-30 DIAGNOSIS — I5022 Chronic systolic (congestive) heart failure: Secondary | ICD-10-CM | POA: Insufficient documentation

## 2015-10-30 NOTE — Telephone Encounter (Signed)
Echo w/EF 40-45%, pt aware and will p/u copy today

## 2015-10-30 NOTE — Progress Notes (Signed)
Echocardiogram 2D Echocardiogram has been performed.  William Contreras 10/30/2015, 10:41 AM

## 2015-12-12 ENCOUNTER — Other Ambulatory Visit (HOSPITAL_COMMUNITY): Payer: Self-pay | Admitting: Cardiology

## 2015-12-18 ENCOUNTER — Other Ambulatory Visit (HOSPITAL_COMMUNITY): Payer: Self-pay | Admitting: Cardiology

## 2016-01-08 ENCOUNTER — Ambulatory Visit (HOSPITAL_COMMUNITY)
Admission: RE | Admit: 2016-01-08 | Discharge: 2016-01-08 | Disposition: A | Discharge status: Home / Self Care | Payer: BLUE CROSS/BLUE SHIELD | Source: Ambulatory Visit | Attending: Cardiology | Admitting: Cardiology

## 2016-01-08 ENCOUNTER — Encounter (HOSPITAL_COMMUNITY): Payer: Self-pay

## 2016-01-08 VITALS — BP 129/86 | HR 57 | Ht 71.0 in | Wt 215.0 lb

## 2016-01-08 DIAGNOSIS — Z7982 Long term (current) use of aspirin: Secondary | ICD-10-CM | POA: Insufficient documentation

## 2016-01-08 DIAGNOSIS — I5021 Acute systolic (congestive) heart failure: Secondary | ICD-10-CM

## 2016-01-08 DIAGNOSIS — E785 Hyperlipidemia, unspecified: Secondary | ICD-10-CM | POA: Diagnosis not present

## 2016-01-08 DIAGNOSIS — I2510 Atherosclerotic heart disease of native coronary artery without angina pectoris: Secondary | ICD-10-CM | POA: Diagnosis not present

## 2016-01-08 DIAGNOSIS — Q231 Congenital insufficiency of aortic valve: Secondary | ICD-10-CM | POA: Diagnosis not present

## 2016-01-08 DIAGNOSIS — Z955 Presence of coronary angioplasty implant and graft: Secondary | ICD-10-CM | POA: Diagnosis not present

## 2016-01-08 DIAGNOSIS — R0989 Other specified symptoms and signs involving the circulatory and respiratory systems: Secondary | ICD-10-CM | POA: Diagnosis not present

## 2016-01-08 DIAGNOSIS — I255 Ischemic cardiomyopathy: Secondary | ICD-10-CM | POA: Insufficient documentation

## 2016-01-08 DIAGNOSIS — Z87891 Personal history of nicotine dependence: Secondary | ICD-10-CM | POA: Diagnosis not present

## 2016-01-08 DIAGNOSIS — Z7902 Long term (current) use of antithrombotics/antiplatelets: Secondary | ICD-10-CM | POA: Diagnosis not present

## 2016-01-08 DIAGNOSIS — I712 Thoracic aortic aneurysm, without rupture: Secondary | ICD-10-CM | POA: Diagnosis not present

## 2016-01-08 DIAGNOSIS — Z79899 Other long term (current) drug therapy: Secondary | ICD-10-CM | POA: Diagnosis not present

## 2016-01-08 DIAGNOSIS — I5022 Chronic systolic (congestive) heart failure: Secondary | ICD-10-CM | POA: Diagnosis not present

## 2016-01-08 DIAGNOSIS — I252 Old myocardial infarction: Secondary | ICD-10-CM | POA: Insufficient documentation

## 2016-01-08 DIAGNOSIS — I110 Hypertensive heart disease with heart failure: Secondary | ICD-10-CM | POA: Diagnosis not present

## 2016-01-08 LAB — BASIC METABOLIC PANEL
ANION GAP: 6 (ref 5–15)
BUN: 21 mg/dL — ABNORMAL HIGH (ref 6–20)
CALCIUM: 9.3 mg/dL (ref 8.9–10.3)
CO2: 25 mmol/L (ref 22–32)
Chloride: 106 mmol/L (ref 101–111)
Creatinine, Ser: 1.04 mg/dL (ref 0.61–1.24)
GLUCOSE: 91 mg/dL (ref 65–99)
Potassium: 3.8 mmol/L (ref 3.5–5.1)
Sodium: 137 mmol/L (ref 135–145)

## 2016-01-08 LAB — LIPID PANEL
Cholesterol: 180 mg/dL (ref 0–200)
HDL: 38 mg/dL — AB (ref >40)
LDL Cholesterol: 83 mg/dL (ref 0–99)
Total CHOL/HDL Ratio: 4.7 RATIO
Triglycerides: 295 mg/dL — ABNORMAL HIGH (ref <150)
VLDL: 59 mg/dL — ABNORMAL HIGH (ref 0–40)

## 2016-01-08 MED ORDER — LISINOPRIL 2.5 MG PO TABS: 2.5000 mg | ORAL_TABLET | Freq: Two times a day (BID) | ORAL | Status: DC

## 2016-01-08 NOTE — Patient Instructions (Signed)
Labs today: BMET and Lipid  You can stop taking your Plavix on March 11, 2016 per Dr. Aundra Dubin  Your physician has recommended you make the following change in your medication: Increase Lisinopril 2.5 mg to twice a day  Your physician has requested that you have a carotid duplex. This test is an ultrasound of the carotid arteries in your neck. It looks at blood flow through these arteries that supply the brain with blood. Allow one hour for this exam. There are no restrictions or special instructions.  Your physician recommends that you schedule a follow-up appointment in: 4 months with Dr.  Aundra Dubin

## 2016-01-10 NOTE — Progress Notes (Signed)
Patient ID: William Contreras, male   DOB: 05/07/1956, 60 y.o.   MRN: [id number] PCP: Dr. Lin Landsman Cardiology: Dr. Aundra Dubin  60 yo with history of HTN was admitted in 8/16 with acute systolic CHF => volume overloaded, short of breath.  Troponin was mildly elevated to 0.04.  Echo showed EF 30-35%.  He was diuresed and had cardiac cath.  This showed diffuse severe disease but the LAD disease was relatively mild to CABG not recommended.  He had PCI with DES to mLCx and proximal to mid RCA.  Cardiac MRI/MRA chest showed EF 39%, bicuspid aortic valve with probable moderate aortic stenosis, 4.9 cm ascending aorta.  Most recent echo in 4/17 with EF 40-45%, moderate AS/moderate AI, 4.5 cm aortic root.   He is currently doing well.  Taking all his meds.  No chest pain.  No exertional dyspnea except walking up a steep hill.  Exercising regularly.  He is back at work full time.  Walks for 30 minutes on treadmill most days.  Watching diet.   ECG: NSR, IVCD 128 msec  Labs (8/16): K 4.1, creatinine 1.0, HCT 42.6, LDL 200 Labs (9/16): K 4, creatinine 1.04, BNP 383 Labs (11/16): K 4.1, creatinine 0.99 Labs (12/16): LDL 152, HDL 37 (not taking Crestor) Labs (3/17): LDL 92, HDL 36, K 4.1, creatinine 0.92  PMH: 1. HTN 2. CAD: NSTEMI/CHF in 8/16.  LHC (8/16) showed 40-50% mLAD, 50% prox ramus, 80% mLCx, total occlusion moderate OM2, 50% pRCA, 80% mRCA, 50% dRCA.  He had DES to prox and mid RCA and mid LCx.   3. Chronic systolic CHF: Ischemic cardiomyopathy.   - Echo (8/16) with EF 30-35%, diffuse hypokinesis, mild LVH, ?bicuspid aortic valve with mild AS and mild AI, 5.1 cm aortic root.  - Cardiac MRI (12/16): EF 39%, mild LV dilation and moderate LVH, inferior/inferolateral/anterolateral hypokinesis, LGE in the inferior and inferolateral walls, normal RV size and systolic function, mild aortic insufficiency, bicuspid aortic valve with probable moderate aortic stenosis.  - Echo (4/17): EF 40-45%, inferolateral  hypokinesis, bicuspid aortic valve with moderate aortic stenosis (mean gradient 33 mmHg), moderate AI, 4.5 cm aortic root. 4. Hyperlipidemia 5. Bicuspid aortic valve: Moderate AS by 12/16 MRI, mild AS by 8/16 echo.  Moderate AS/moderate AI by 4/17 echo.  6. Ascending aortic aneurysm: 4.9 cm ascending aorta by MRA chest in 12/16.  Aortic root 4.5 cm on 4/17 echo.   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   FH: No CAD that he knows of. +HTN.  ROS: All systems reviewed and negative except as per HPI.   Current Outpatient Prescriptions  Medication Sig Dispense Refill  . aspirin EC 81 MG tablet Take 81 mg by mouth daily.    . calcium citrate-vitamin D (CITRACAL+D) 315-200 MG-UNIT tablet Take 1 tablet by mouth daily.    . carvedilol (COREG) 6.25 MG tablet Take 1 tablet (6.25 mg total) by mouth 2 (two) times daily. 180 tablet 3  . clopidogrel (PLAVIX) 75 MG tablet Take 1 tablet (75 mg total) by mouth daily. 90 tablet 3  . Coenzyme Q10 (COQ10) 200 MG CAPS Take 200 mg by mouth daily. 30 capsule 2  . Flaxseed, Linseed, (FLAXSEED OIL) 1000 MG CAPS Take 1,000 mg by mouth daily.    . fluticasone (FLONASE) 50 MCG/ACT nasal spray Place 2 sprays into both nostrils daily as needed for allergies or rhinitis.    . Iron-Vitamins (GERITOL COMPLETE) TABS Take 1  tablet by mouth daily.    . Lactobacillus (ULTIMATE PROBIOTIC FORMULA) CAPS Take 1 capsule by mouth daily.    Marland Kitchen lisinopril (PRINIVIL,ZESTRIL) 2.5 MG tablet Take 1 tablet (2.5 mg total) by mouth 2 (two) times daily. Reported on 01/08/2016 60 tablet 3  . Magnesium 400 MG TABS Take 400 mg by mouth daily.    . Omega-3 Fatty Acids (FISH OIL) 1200 MG CAPS Take 1,200 mg by mouth daily.    . rosuvastatin (CRESTOR) 10 MG tablet TAKE ONE TABLET BY MOUTH ONCE DAILY 30 tablet 3  . spironolactone (ALDACTONE) 25 MG tablet TAKE ONE-HALF TABLET BY MOUTH DAILY 15 tablet 0   No current facility-administered  medications for this encounter.   BP 129/86 mmHg  Pulse 57  Ht 5\' 11"  (1.803 m)  Wt 215 lb (97.523 kg)  BMI 30.00 kg/m2  SpO2 98% General: NAD Neck: Thick, no JVD, no thyromegaly or thyroid nodule.  Lungs: Clear to auscultation bilaterally with normal respiratory effort. CV: Nondisplaced PMI.  Heart regular S1/S2, no S3/S4, 2/6 early SEM RUSB.  1+ ankle edema.  Left carotid bruit.  Normal pedal pulses.  Abdomen: Soft, nontender, no hepatosplenomegaly, no distention.  Skin: Intact without lesions or rashes.  Neurologic: Alert and oriented x 3.  Psych: Normal affect. Extremities: No clubbing or cyanosis.  HEENT: Normal.   Assessment/Plan: 1. CAD: S/p ACS with DES to mLCx and proximal to mid RCA.  No chest pain.  - Continue ASA 81, Plavix 75.  He wants to stop Plavix as soon as possible, told him that he can stop it in 9/17.  - Continue Crestor, he is now taking this regularly.  LDL not at goal (<70) on last lipid check.  I wanted him to increase Crestor but he was reluctant.  Will check lipids today.   2. Chronic systolic CHF: Ischemic cardiomyopathy.  EF 30-35% by echo in the hospital.  Cardiac MRI showed EF up to 39% in 12/16.  Echo 4/17 showed EF improved to 40-45%.  NYHA class II symptoms.  - Continue current Coreg and spironolactone.  - Increase lisinopril to 2.5 mg bid with BMET in 2 wks.   He has been reluctant to increase this in the past but says that he will today.  3. Bicuspid aortic valve: moderate AI, moderate AS by echo in 4/17.  I will get a repeat echo in 4/18 to reassess the valve.  4. Hyperlipidemia: Check lipids/LFTs today.  If LDL < 70, will encourage increasing Crestor.  5. OSA: Minimal snoring now and no daytime sleepiness.  He wants to hold off on sleep study for now.  6. Ascending aortic aneurysm: 4.9 cm on MRA chest in 12/16.  Likely related to bicuspid aortic valve.  4.5 cm aortic root on 4/17 echo.  He needs repeat MRA in 12/17 to follow. 7. Carotid bruit: I  will arrange for carotid dopplers to assess.   Loralie Champagne 01/10/2016

## 2016-01-11 ENCOUNTER — Other Ambulatory Visit (HOSPITAL_COMMUNITY): Payer: Self-pay | Admitting: *Deleted

## 2016-01-11 DIAGNOSIS — R0989 Other specified symptoms and signs involving the circulatory and respiratory systems: Secondary | ICD-10-CM

## 2016-01-13 ENCOUNTER — Other Ambulatory Visit (HOSPITAL_COMMUNITY): Payer: Self-pay | Admitting: Cardiology

## 2016-01-15 ENCOUNTER — Telehealth (HOSPITAL_COMMUNITY): Payer: Self-pay | Admitting: Cardiology

## 2016-01-15 MED ORDER — FISH OIL 1000 MG PO CAPS: ORAL_CAPSULE | ORAL | Status: DC

## 2016-01-15 NOTE — Telephone Encounter (Signed)
Pt aware.

## 2016-01-15 NOTE — Telephone Encounter (Signed)
-----   Message from Larey Dresser, MD sent at 01/09/2016 10:36 AM EDT ----- LDL better, hold off on increasing statin.  Would suggest 2 g bid fish oil (over the counter) for triglycerides.

## 2016-01-22 ENCOUNTER — Ambulatory Visit (HOSPITAL_COMMUNITY)
Admission: RE | Admit: 2016-01-22 | Discharge: 2016-01-22 | Disposition: A | Discharge status: Home / Self Care | Payer: BLUE CROSS/BLUE SHIELD | Source: Ambulatory Visit | Attending: Cardiovascular Disease | Admitting: Cardiovascular Disease

## 2016-01-22 DIAGNOSIS — R0989 Other specified symptoms and signs involving the circulatory and respiratory systems: Secondary | ICD-10-CM

## 2016-01-22 DIAGNOSIS — I10 Essential (primary) hypertension: Secondary | ICD-10-CM | POA: Diagnosis not present

## 2016-01-22 DIAGNOSIS — I6523 Occlusion and stenosis of bilateral carotid arteries: Secondary | ICD-10-CM

## 2016-01-22 DIAGNOSIS — Z6830 Body mass index (BMI) 30.0-30.9, adult: Secondary | ICD-10-CM | POA: Diagnosis not present

## 2016-02-02 ENCOUNTER — Telehealth (HOSPITAL_COMMUNITY): Payer: Self-pay

## 2016-02-02 NOTE — Telephone Encounter (Signed)
Patient called CHF triage line requesting lipid results be mailed to his home. Report printed and mailed to address as confirmed by patient.  Renee Pain

## 2016-05-12 ENCOUNTER — Other Ambulatory Visit: Payer: Self-pay

## 2016-05-12 ENCOUNTER — Other Ambulatory Visit (HOSPITAL_COMMUNITY): Payer: Self-pay | Admitting: Cardiology

## 2016-05-27 ENCOUNTER — Ambulatory Visit (HOSPITAL_COMMUNITY)
Admission: RE | Admit: 2016-05-27 | Discharge: 2016-05-27 | Disposition: A | Discharge status: Home / Self Care | Payer: BLUE CROSS/BLUE SHIELD | Source: Ambulatory Visit | Attending: Cardiology | Admitting: Cardiology

## 2016-05-27 ENCOUNTER — Encounter (HOSPITAL_COMMUNITY): Payer: Self-pay

## 2016-05-27 ENCOUNTER — Encounter (HOSPITAL_COMMUNITY): Payer: BLUE CROSS/BLUE SHIELD

## 2016-05-27 VITALS — BP 124/84 | HR 62 | Wt 213.8 lb

## 2016-05-27 DIAGNOSIS — I5022 Chronic systolic (congestive) heart failure: Secondary | ICD-10-CM | POA: Diagnosis not present

## 2016-05-27 DIAGNOSIS — R42 Dizziness and giddiness: Secondary | ICD-10-CM | POA: Diagnosis not present

## 2016-05-27 DIAGNOSIS — I110 Hypertensive heart disease with heart failure: Secondary | ICD-10-CM | POA: Insufficient documentation

## 2016-05-27 DIAGNOSIS — I712 Thoracic aortic aneurysm, without rupture: Secondary | ICD-10-CM | POA: Diagnosis not present

## 2016-05-27 DIAGNOSIS — E785 Hyperlipidemia, unspecified: Secondary | ICD-10-CM | POA: Diagnosis not present

## 2016-05-27 DIAGNOSIS — I2510 Atherosclerotic heart disease of native coronary artery without angina pectoris: Secondary | ICD-10-CM | POA: Insufficient documentation

## 2016-05-27 DIAGNOSIS — Q231 Congenital insufficiency of aortic valve: Secondary | ICD-10-CM | POA: Diagnosis not present

## 2016-05-27 DIAGNOSIS — I255 Ischemic cardiomyopathy: Secondary | ICD-10-CM | POA: Insufficient documentation

## 2016-05-27 LAB — BASIC METABOLIC PANEL
Anion gap: 6 (ref 5–15)
BUN: 20 mg/dL (ref 6–20)
CO2: 25 mmol/L (ref 22–32)
CREATININE: 0.94 mg/dL (ref 0.61–1.24)
Calcium: 9.5 mg/dL (ref 8.9–10.3)
Chloride: 104 mmol/L (ref 101–111)
GFR calc Af Amer: >60 mL/min (ref >60)
Glucose, Bld: 97 mg/dL (ref 65–99)
POTASSIUM: 4.4 mmol/L (ref 3.5–5.1)
SODIUM: 135 mmol/L (ref 135–145)

## 2016-05-27 NOTE — Patient Instructions (Signed)
Labs today (will call for abnormal results, otherwise no news is good news)  MRA Chest has been ordered for you  Echo with Follow up in April, 2018

## 2016-05-28 NOTE — Progress Notes (Signed)
Patient ID: William Contreras, male   DOB: 05/04/1956, 60 y.o.   MRN: [id number] PCP: Dr. Lin Landsman Cardiology: Dr. Aundra Dubin  60 yo with history of HTN was admitted in 8/16 with acute systolic CHF => volume overloaded, short of breath.  Troponin was mildly elevated to 0.04.  Echo showed EF 30-35%.  He was diuresed and had cardiac cath.  This showed diffuse severe disease but the LAD disease was relatively mild to CABG not recommended.  He had PCI with DES to mLCx and proximal to mid RCA.  Cardiac MRI/MRA chest showed EF 39%, bicuspid aortic valve with probable moderate aortic stenosis, 4.9 cm ascending aorta.  Most recent echo in 4/17 with EF 40-45%, moderate AS/moderate AI, 4.5 cm aortic root.   He is currently doing well.  Taking all his meds.  No chest pain.  No exertional dyspnea.  Working full time, does a lot of lifting/carrying with his work.  Exercising regularly.  Walks for 30 minutes on treadmill most days.  Watching diet.  Weight down 2 lbs.    Labs (8/16): K 4.1, creatinine 1.0, HCT 42.6, LDL 200 Labs (9/16): K 4, creatinine 1.04, BNP 383 Labs (11/16): K 4.1, creatinine 0.99 Labs (12/16): LDL 152, HDL 37 (not taking Crestor) Labs (3/17): LDL 92, HDL 36, K 4.1, creatinine 0.92 Labs (6/17): LDL 83, HDL 38, K 3.8, creatinine 1.04  PMH: 1. HTN 2. CAD: NSTEMI/CHF in 8/16.  LHC (8/16) showed 40-50% mLAD, 50% prox ramus, 80% mLCx, total occlusion moderate OM2, 50% pRCA, 80% mRCA, 50% dRCA.  He had DES to prox and mid RCA and mid LCx.   3. Chronic systolic CHF: Ischemic cardiomyopathy.   - Echo (8/16) with EF 30-35%, diffuse hypokinesis, mild LVH, ?bicuspid aortic valve with mild AS and mild AI, 5.1 cm aortic root.  - Cardiac MRI (12/16): EF 39%, mild LV dilation and moderate LVH, inferior/inferolateral/anterolateral hypokinesis, LGE in the inferior and inferolateral walls, normal RV size and systolic function, mild aortic insufficiency, bicuspid aortic valve with probable moderate aortic stenosis.   - Echo (4/17): EF 40-45%, inferolateral hypokinesis, bicuspid aortic valve with moderate aortic stenosis (mean gradient 33 mmHg), moderate AI, 4.5 cm aortic root. 4. Hyperlipidemia 5. Bicuspid aortic valve: Moderate AS by 12/16 MRI, mild AS by 8/16 echo.  Moderate AS/moderate AI by 4/17 echo.  6. Ascending aortic aneurysm: 4.9 cm ascending aorta by MRA chest in 12/16.  Aortic root 4.5 cm on 4/17 echo.  7. Carotid dopplers (7/17) with no significant disease.   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  Lives in [place].   FH: No CAD that he knows of. +HTN.  ROS: All systems reviewed and negative except as per HPI.   Current Outpatient Prescriptions  Medication Sig Dispense Refill  . aspirin EC 81 MG tablet Take 81 mg by mouth daily.    . calcium citrate-vitamin D (CITRACAL+D) 315-200 MG-UNIT tablet Take 1 tablet by mouth daily.    . carvedilol (COREG) 6.25 MG tablet TAKE ONE TABLET BY MOUTH TWICE DAILY 60 tablet 11  . Coenzyme Q10 (COQ10) 200 MG CAPS Take 200 mg by mouth daily. 30 capsule 2  . Flaxseed, Linseed, (FLAXSEED OIL) 1000 MG CAPS Take 1,000 mg by mouth daily.    . fluticasone (FLONASE) 50 MCG/ACT nasal spray Place 2 sprays into both nostrils daily as needed for allergies or rhinitis.    . Iron-Vitamins (GERITOL COMPLETE) TABS Take 1 tablet by mouth daily.    Marland Kitchen  Lactobacillus (ULTIMATE PROBIOTIC FORMULA) CAPS Take 1 capsule by mouth daily.    Marland Kitchen lisinopril (PRINIVIL,ZESTRIL) 2.5 MG tablet Take 1 tablet (2.5 mg total) by mouth 2 (two) times daily. Reported on 01/08/2016 60 tablet 3  . Magnesium 400 MG TABS Take 400 mg by mouth daily.    . Omega-3 Fatty Acids (FISH OIL) 1000 MG CAPS 2 grams po BID  0  . rosuvastatin (CRESTOR) 10 MG tablet TAKE ONE TABLET BY MOUTH ONCE DAILY 30 tablet 3  . spironolactone (ALDACTONE) 25 MG tablet TAKE ONE-HALF TABLET BY MOUTH ONCE DAILY 15 tablet 3   No current facility-administered medications for this encounter.     BP 124/84   Pulse 62   Wt 213 lb 12 oz (97 kg)   SpO2 99%   BMI 29.81 kg/m  General: NAD Neck: Thick, no JVD, no thyromegaly or thyroid nodule.  Lungs: Clear to auscultation bilaterally with normal respiratory effort. CV: Nondisplaced PMI.  Heart regular S1/S2, no S3/S4, 2/6 early SEM RUSB.  No edema.  Left carotid bruit.  Normal pedal pulses.  Abdomen: Soft, nontender, no hepatosplenomegaly, no distention.  Skin: Intact without lesions or rashes.  Neurologic: Alert and oriented x 3.  Psych: Normal affect. Extremities: No clubbing or cyanosis.  HEENT: Normal.   Assessment/Plan: 1. CAD: S/p ACS with DES to mLCx and proximal to mid RCA.  No chest pain.  - Continue ASA 81 and Crestor.   2. Chronic systolic CHF: Ischemic cardiomyopathy.  EF 30-35% by echo in the hospital.  Cardiac MRI showed EF up to 39% in 12/16.  Echo 4/17 showed EF improved to 40-45%.  NYHA class I symptoms now.  - Continue current Coreg and spironolactone.  - Keep lisinopril at 2.5 mg qhs.  He got lightheaded when he tried to increase lisinopril after last appointment. - BMET today.  3. Bicuspid aortic valve: moderate AI, moderate AS by echo in 4/17.  I will get a repeat echo in 4/18 to reassess the valve.  4. Hyperlipidemia: Recent lipids were acceptable.  5. Ascending aortic aneurysm: 4.9 cm on MRA chest in 12/16.  Likely related to bicuspid aortic valve.  4.5 cm aortic root on 4/17 echo.  He needs repeat MRA in 12/17 to follow, order today.   Loralie Champagne 05/28/2016

## 2016-05-31 ENCOUNTER — Telehealth (HOSPITAL_COMMUNITY): Payer: Self-pay | Admitting: *Deleted

## 2016-05-31 NOTE — Telephone Encounter (Signed)
Left pt detailed mess on his ID VM stating all labs from last week were normal/stable, call back for questions

## 2016-06-22 ENCOUNTER — Other Ambulatory Visit (HOSPITAL_COMMUNITY): Payer: Self-pay | Admitting: Cardiology

## 2016-06-22 NOTE — Addendum Note (Signed)
Encounter addended by: Kennieth Rad, RN on: 06/22/2016  3:12 PM<BR>    Actions taken: Order list changed, Diagnosis association updated

## 2016-06-24 ENCOUNTER — Ambulatory Visit (HOSPITAL_COMMUNITY)
Admission: RE | Admit: 2016-06-24 | Discharge: 2016-06-24 | Disposition: A | Discharge status: Home / Self Care | Payer: BLUE CROSS/BLUE SHIELD | Source: Ambulatory Visit | Attending: Cardiology | Admitting: Cardiology

## 2016-06-24 DIAGNOSIS — I712 Thoracic aortic aneurysm, without rupture: Secondary | ICD-10-CM | POA: Insufficient documentation

## 2016-06-24 DIAGNOSIS — Q231 Congenital insufficiency of aortic valve: Secondary | ICD-10-CM | POA: Diagnosis not present

## 2016-06-24 MED ORDER — GADOBENATE DIMEGLUMINE 529 MG/ML IV SOLN
20.0000 mL | Freq: Once | INTRAVENOUS | Status: AC
  Administered 2016-06-24: 20 mL via INTRAVENOUS

## 2016-07-01 ENCOUNTER — Telehealth (HOSPITAL_COMMUNITY): Payer: Self-pay | Admitting: *Deleted

## 2016-07-01 DIAGNOSIS — I712 Thoracic aortic aneurysm, without rupture: Secondary | ICD-10-CM

## 2016-07-01 DIAGNOSIS — I7121 Aneurysm of the ascending aorta, without rupture: Secondary | ICD-10-CM

## 2016-07-01 DIAGNOSIS — I714 Abdominal aortic aneurysm, without rupture, unspecified: Secondary | ICD-10-CM

## 2016-07-01 NOTE — Telephone Encounter (Signed)
-----   Message from Larey Dresser, MD sent at 06/26/2016 10:09 PM EST ----- 5.1 cm ascending aorta. He should start having regular followup with cardiac surgeon for surveillance of the aneurysm.  Would recommend Dr. Servando Snare.

## 2016-07-01 NOTE — Telephone Encounter (Signed)
Notes Recorded by Scarlette Calico, RN on 07/01/2016 at 3:56 PM EST Discussed w/pt, he is aware and verbalizes understanding, referral placed

## 2016-07-05 ENCOUNTER — Other Ambulatory Visit (HOSPITAL_COMMUNITY): Payer: Self-pay | Admitting: Cardiology

## 2016-07-19 ENCOUNTER — Institutional Professional Consult (permissible substitution) (INDEPENDENT_AMBULATORY_CARE_PROVIDER_SITE_OTHER): Payer: BLUE CROSS/BLUE SHIELD | Admitting: Cardiothoracic Surgery

## 2016-07-19 ENCOUNTER — Encounter: Payer: Self-pay | Admitting: Cardiothoracic Surgery

## 2016-07-19 VITALS — BP 94/64 | HR 54 | Resp 20 | Ht 71.0 in | Wt 212.0 lb

## 2016-07-19 DIAGNOSIS — I7121 Aneurysm of the ascending aorta, without rupture: Secondary | ICD-10-CM

## 2016-07-19 DIAGNOSIS — I712 Thoracic aortic aneurysm, without rupture: Secondary | ICD-10-CM

## 2016-07-19 NOTE — Patient Instructions (Signed)
It's best to avoid activities that cause grunting or straining (medically referred to as a "valsalva maneuver"). This happens when a person bears down against a closed throat to increase the strength of arm or abdominal muscles. There's often a tendency to do this when lifting heavy weights, doing sit-ups, push-ups or chin-ups, etc., but it may be harmful. Avoid constipation.   Thoracic Aortic Aneurysm An aneurysm is a bulge in an artery. It happens when blood pushes up against a weakened or damaged artery wall. A thoracic aortic aneurysm is an aneurysm that occurs in the first part of the aorta, between the heart and the diaphragm. The aorta is the main artery of the body. It supplies blood from the heart to the rest of the body. Some aneurysms may not cause symptoms or problems. However, the major concern with a thoracic aortic aneurysm is that it can enlarge and burst (rupture), or blood can flow between the layers of the wall of the aorta through a tear (aorticdissection). Both of these conditions can cause bleeding inside the body and can be life-threatening if they are not diagnosed and treated right away. What are the causes? The exact cause of this condition is not known. What increases the risk? The following factors may make you more likely to develop this condition:  Being age 5 or older.  Having a hardening of the arteries caused by the buildup of fat and other substances in the lining of a blood vessel (arteriosclerosis).  Having inflammation of the walls of an artery (arteritis).  Having a genetic disease that weakens the body's connective tissue, such as Marfan syndrome.  Having an injury or trauma to the aorta.  Having an infection that is caused by bacteria, such as syphilis or staphylococcus, in the wall of the aorta (infectious aortitis).  Having high blood pressure (hypertension).  Being male.  Being white (Caucasian).  Having high cholesterol.  Having a family  history of aneurysms.  Using tobacco.  Having chronic obstructive pulmonary disease (COPD). What are the signs or symptoms? Symptoms of this condition vary depending on the size and rate of growth of the aneurysm. Most grow slowly and do not cause any symptoms. When symptoms do occur, they may include:  Pain in the chest, back, sides, or abdomen. The pain may vary in intensity. A sudden onset of severe pain may indicate that the aneurysm has ruptured.  Hoarseness.  Cough.  Shortness of breath.  Swallowing problems.  Swelling in the face, arms, or legs.  Fever.  Unexplained weight loss. How is this diagnosed? This condition may be diagnosed with:  An ultrasound.  X-rays.  A CT scan.  An MRI.  Tests to check the arteries for damage or blockages (angiogram). Most unruptured thoracic aortic aneurysms cause no symptoms, so they are often found during exams for other conditions. How is this treated? Treatment for this condition depends on:  The size of the aneurysm.  How fast the aneurysm is growing.  Your age.  Risk factors for rupture. Aneurysms that are smaller than 2.2 inches (5.5 cm) may be managed by using medicines to control blood pressure, manage pain, or fight infection. You may need regular monitoring to see if the aneurysm is getting bigger. Your health care provider may recommend that you have an ultrasound every year or every 6 months. How often you need to have an ultrasound depends on the size of the aneurysm, how fast it is growing, and whether you have a family history of  aneurysms. Surgical repair may be needed if your aneurysm is larger than 2.2 inches or if it is growing quickly. Follow these instructions at home: Eating and drinking  Eat a healthy diet. Your health care provider may recommend that you:  Lower your salt (sodium) intake. In some people, too much salt can raise blood pressure and increase the risk of thoracic aortic aneurysm.  Avoid  foods that are high in saturated fat and cholesterol, such as red meat and dairy.  Eat a diet that is low in sugar.  Increase your fiber intake by including whole grains, vegetables, and fruits in your diet. Eating these foods may help to lower blood pressure.  Limit or avoid alcohol as recommended by your health care provider. Lifestyle  Follow instructions from your health care provider about healthy lifestyle habits. Your health care provider may recommend that you:  Do not use any products that contain nicotine or tobacco, such as cigarettes and e-cigarettes. If you need help quitting, ask your health care provider.  Keep your blood pressure within normal limits. The target limit for most people is below 120/80. Check your blood pressure regularly. If it is high, ask your health care provider about ways that you can control it.  Keep your blood sugar (glucose) level and cholesterol levels within normal limits. Target limits for most people are:  Blood glucose level: Less than 100 mg/dL.  Total cholesterol level: Less than 200 mg/dL.  Maintain a healthy weight. Activity  Stay physically active and exercise regularly. Talk with your health care provider about how often you should exercise and ask which types of exercise are safe for you.  Avoid heavy lifting and activities that take a lot of effort (are strenuous). Ask your health care provider what activities are safe for you. General instructions  Keep all follow-up visits as told by your health care provider. This is important.  Talk with your health care provider about regular screenings to see if the aneurysm is getting bigger.  Take over-the-counter and prescription medicines only as told by your health care provider. Contact a health care provider if:  You have discomfort in your upper back, neck, or abdomen.  You have trouble swallowing.  You have a cough or hoarseness.  You have a family history of  aneurysms.  You have unexplained weight loss. Get help right away if:  You have sudden, severe pain in your upper back and abdomen. This pain may move into your chest and arms.  You have shortness of breath.  You have a fever. This information is not intended to replace advice given to you by your health care provider. Make sure you discuss any questions you have with your health care provider. Document Released: 06/27/2005 Document Revised: 04/08/2016 Document Reviewed: 04/08/2016 Elsevier Interactive Patient Education  2017 [employer].    Aortic Dissection An aortic dissection is a tear in your aorta. The aorta is the main blood vessel that carries blood out of your heart to supply the rest of your body. It comes out of your heart and curves around, then goes down through your chest (thoracic aorta) and into your belly (abdominal aorta). The wall of the aorta has inner and outer layers. Aortic dissection occurs most often in the thoracic aorta. This is more likely to happen if the inner layer of the aorta has a weak spot or gets injured. As the dissection widens and blood flows through it, the aorta becomes "double-barreled." This means that one part of  the aorta continues to carry blood. However, the inner wall begins to separate from the rest of the aorta as blood flows through the tear. The torn part of the aorta fills with blood. It swells up like a balloon. This can reduce blood flow through the part of the aorta that is still working. Aortic dissection is a medical emergency. CAUSES Aortic dissection happens when there is a tear in the inner wall of the aorta. An injury or weakness can cause this tear. Sometimes the exact cause of the tear is not known. RISK FACTORS You may be at greater risk for aortic dissection if you:  Have certain medical conditions, such as uncontrolled high blood pressure or atherosclerosis.  Have a blunt injury to your chest.  Have a genetic disorder  that affects the connective tissue, such as Marfan syndrome, Turner syndrome, and Ehlers-Danlos syndrome.  Are born with a problem that affects either your aorta or your heart valve.  Have a condition that causes inflammation of blood vessels, such as giant cell arteritis.  Are male.  Are older than 59 years of age.  Use cocaine.  Smoke.  Lift very heavy weights or do other types of high-intensity resistance training. SIGNS AND SYMPTOMS  Signs and symptoms of aortic dissection may start suddenly. Changes in position may make symptoms worse. The most common symptoms are:  Severe chest pain that may feel like a tearing, stabbing, or sharp pain.  Pain that shifts to the shoulder, arm, neck, jaw, abdomen, or hips. Other symptoms may include:  Severe abdominal pain.  Trouble breathing.  Dizziness or fainting.  Nausea or vomiting.  Trouble swallowing.  Sweating a lot.  Feeling confused, dazed, anxious, or fearful. DIAGNOSIS Your health care provider may suspect aortic dissection based on your signs and symptoms and will perform a physical exam. During the physical exam, your health care provider may listen for abnormal blood flow sounds (murmurs) in your chest or your belly. You may also have your blood pressure checked to see whether it is low or whether there is a difference between the measurements in your arms and your legs. You may also have tests such as:  Electrocardiogram (ECG). This is a test that measures the electrical activity in your heart.  Chest X-ray.  CT scan or MRI.  Aortic angiogram. This test uses the injection of a dye to make it easier to see your blood vessels clearly.  Echocardiogram to study your heart using sound waves. TREATMENT It is important to treat an aortic dissection as quickly as possible. Your treatment may start as soon as your health care provider suspects aortic dissection. Treatment will depend on how severe your dissection is, where  it is located, and your overall health. Treatment options include:  Medicines to lower your blood pressure.  Surgery to remove the dissected part of your aorta and replace it with a graft.  Medical procedures to thread long, thin tubes (catheters) into the aorta (endovascular procedures). This may be done to place a graft or a balloon in the blood vessel to improve blood flow or prevent further dissection. HOME CARE INSTRUCTIONS  Work with your health care provider to keep your blood pressure under control.  Avoid activities that could cause an injury to your chest or your abdomen.  Do not smoke. If you need help quitting, ask your health care provider.  Do not participate in sports or exercises that involve lifting weights.  Keep all follow-up visits as directed by your health  care provider. This is important. SEEK MEDICAL CARE IF:  You develop any new symptoms of aortic dissection after treatment. SEEK IMMEDIATE MEDICAL CARE IF:  You have severe pain in your chest or your abdomen.  You have trouble breathing. These symptoms may represent a serious problem that is an emergency. Do not wait to see if the symptoms will go away. Get medical help right away. Call your local emergency services (911 in the [place] Do not drive yourself to the hospital.  This information is not intended to replace advice given to you by your health care provider. Make sure you discuss any questions you have with your health care provider. Document Released: 10/04/2007 Document Revised: 07/18/2014 Document Reviewed: 02/05/2014 Elsevier Interactive Patient Education  2017 [employer].

## 2016-07-20 NOTE — Progress Notes (Addendum)
[address]       [place] [address]             [phone]                    Elvie Strahm [facility] Record [id number] Date of Birth: [birth year]  Referring: Larey Dresser, MD Primary Care: [facility] Angelique Blonder., MD  Chief Complaint:    Chief Complaint  Patient presents with  . Thoracic Aortic Aneurysm    Surgical eval, MRA Chest 06/24/16    History of Present Illness:    Leny Kiesling 61 y.o. male is seen in the office  today after recent MRA of the chest to evaluate ascending aorta.  He presented to cardiology in 8/16 with acute systolic CHF .  Troponin was mildly elevated to 0.04.  Echo showed EF 30-35%. Cardiac Cath showed diffuse severe disease but the LAD disease was relatively mild .  He had PCI with DES to mLCx and proximal to mid RCA.  Cardiac MRI/MRA chest showed EF 39%, bicuspid aortic valve with probable moderate aortic stenosis, 4.9 cm ascending aorta.  Most recent echo in 4/17 with EF 40-45%, moderate AS/moderate AI, 4.5 cm aortic root.   He now notes that he is without symptoms .  No chest pain.  No exertional dyspnea.  Working full time, does a lot of lifting/carrying with his work.     He has no family history of bicuspid valve, dilated aorta , aortic dissection or sudden unexplaind death of 1st degree relatives. His father died in his 49's in MVA, mother is alive at 46. Has brother and sister without known cardiac disease.     Current Activity/ Functional Status:  Patient is independent with mobility/ambulation, transfers, ADL's, IADL's.   Zubrod Score: At the time of surgery this patient's most appropriate activity status/level should be described as: [x]     0    Normal activity, no symptoms []     1    Restricted in physical strenuous activity but ambulatory, able to do out light work []     2    Ambulatory and capable of self care, unable to do work activities, up and about               >50 % of waking hours                               []     3    Only limited self care, in bed greater than 50% of waking hours []     4    Completely disabled, no self care, confined to bed or chair []     5    Moribund   Past Medical History:  Diagnosis Date  . Essential hypertension   . Morbid obesity ([place])     Past Surgical History:  Procedure Laterality Date  . CARDIAC CATHETERIZATION N/A 03/10/2015   Procedure: Right/Left Heart Cath and Coronary Angiography;  Surgeon: Larey Dresser, MD;  Location: [facility];  Service: Cardiovascular;  Laterality: N/A;  . CARDIAC CATHETERIZATION N/A 03/10/2015   Procedure: Coronary Stent Intervention;  Surgeon: Leonie Man, MD;  Location: [facility];  Service: Cardiovascular;  Laterality: N/A;  . TONSILLECTOMY      Family History  Problem Relation Age of Onset  . Other Father     died young in [place].  Marland Kitchen  Heart attack Mother     alive @ 11.  . Other Brother     A & W  . Other Sister     A & W    Social History   Social History  . Marital status: Married    Spouse name: N/A  . Number of children: N/A  . Years of education: N/A   Occupational History  . Not on file.   Social History Main Topics  . Smoking status: Former Smoker    Packs/day: 1.00    Years: 15.00    Types: Cigarettes  . Smokeless tobacco: Not on file     Comment: quit smoking ~ 20 yrs ago.  . Alcohol use No     Comment: prev drank - none in ~ 25 yrs.  . Drug use: No     Comment: prev smoked MJ, none in 25 yrs.  . Sexual activity: Yes   Other Topics Concern  . Not on file   Social History Narrative   Pt lives in [place] with his wife and son.  He has 2 step children and another child - all grown and out of the house.    History  Smoking Status  . Former Smoker  . Packs/day: 1.00  . Years: 15.00  . Types: Cigarettes  Smokeless Tobacco  . Not on file    Comment: quit smoking ~ 20 yrs ago.    History  Alcohol Use No    Comment: prev drank - none in ~ 25 yrs.      Allergies  Allergen Reactions  . Lipitor [Atorvastatin] Other (See Comments)    Joint and head pains  . Penicillins Other (See Comments)    Lightheadness    Current Outpatient Prescriptions  Medication Sig Dispense Refill  . aspirin EC 81 MG tablet Take 81 mg by mouth daily.    . calcium citrate-vitamin D (CITRACAL+D) 315-200 MG-UNIT tablet Take 1 tablet by mouth daily.    . carvedilol (COREG) 6.25 MG tablet TAKE ONE TABLET BY MOUTH TWICE DAILY 60 tablet 11  . Coenzyme Q10 (COQ10) 200 MG CAPS Take 200 mg by mouth daily. 30 capsule 2  . Flaxseed, Linseed, (FLAXSEED OIL) 1000 MG CAPS Take 1,000 mg by mouth daily.    . fluticasone (FLONASE) 50 MCG/ACT nasal spray Place 2 sprays into both nostrils daily as needed for allergies or rhinitis.    . Iron-Vitamins (GERITOL COMPLETE) TABS Take 1 tablet by mouth daily.    . Lactobacillus (ULTIMATE PROBIOTIC FORMULA) CAPS Take 1 capsule by mouth daily.    Marland Kitchen lisinopril (PRINIVIL,ZESTRIL) 2.5 MG tablet TAKE ONE TABLET BY MOUTH TWICE DAILY 60 tablet 3  . Magnesium 400 MG TABS Take 400 mg by mouth daily.    . Omega-3 Fatty Acids (FISH OIL) 1000 MG CAPS 2 grams po BID  0  . rosuvastatin (CRESTOR) 10 MG tablet TAKE ONE TABLET BY MOUTH ONCE DAILY 30 tablet 3  . spironolactone (ALDACTONE) 25 MG tablet TAKE ONE-HALF TABLET BY MOUTH ONCE DAILY 15 tablet 3   No current facility-administered medications for this visit.       Review of Systems:     Cardiac Review of Systems: Y or N  Chest Pain [  n  ]  Resting SOB [ n  ] Exertional SOB n [  ]  Orthopnea [ n ]   Pedal Edema [ n  ]    Palpitations [ n ] Syncope  [n  ]  Presyncope [  n ]  General Review of Systems: [Y] = yes [  ]=no Constitional: recent weight change [ n ];  Wt loss over the last 3 months [   ] anorexia [  ]; fatigue [  ]; nausea [  ]; night sweats [  ]; fever [  ]; or chills [  ];          Dental: poor dentition[  n]; Last Dentist visit: every 6 months sees dentist   Eye : blurred  vision [  ]; diplopia [   ]; vision changes [  ];  Amaurosis fugax[  ]; Resp: cough [  ];  wheezing[  ];  hemoptysis[  ]; shortness of breath[  ]; paroxysmal nocturnal dyspnea[  ]; dyspnea on exertion[  ]; or orthopnea[  ];  GI:  gallstones[n  ], vomiting[  ];  dysphagia[  ]; melena[  ];  hematochezia [  ]; heartburn[  ];   Hx of  Colonoscopy[  ]; GU: kidney stones [  ]; hematuria[  ];   dysuria [  ];  nocturia[  ];  history of     obstruction [  ]; urinary frequency [  ]             Skin: rash, swelling[  ];, hair loss[  ];  peripheral edema[n  ];  or itching[  ]; Musculosketetal: myalgias[  ];  joint swelling[  ];  joint erythema[  ];  joint pain[  ];  back pain[  ];  Heme/Lymph: bruising[  ];  bleeding[  ];  anemia[  ];  Neuro: TIA[  ];  headaches[  ];  stroke[  ];  vertigo[  ];  seizures[  ];   paresthesias[  ];  difficulty walking[n  ];  Psych:depression[  ]; anxiety[  ];  Endocrine: diabetes[n  ];  thyroid dysfunction[n  ];  Immunizations: Flu up to date Florencio.Farrier  ]; Pneumococcal up to date [ n ];  Other:  Physical Exam: BP 94/64   Pulse (!) 54   Resp 20   Ht 5\' 11"  (1.803 m)   Wt 212 lb (96.2 kg)   SpO2 98% Comment: RA  BMI 29.57 kg/m   PHYSICAL EXAMINATION: General appearance: alert, cooperative and appears stated age Head: Normocephalic, without obvious abnormality, atraumatic Neck: no adenopathy, no carotid bruit, no JVD, supple, symmetrical, trachea midline and thyroid not enlarged, symmetric, no tenderness/mass/nodules Lymph nodes: Cervical, supraclavicular, and axillary nodes normal. Resp: clear to auscultation bilaterally Back: symmetric, no curvature. ROM normal. No CVA tenderness. Cardio: systolic murmur: holosystolic 3/6, crescendo at lower left sternal border GI: soft, non-tender; bowel sounds normal; no masses,  no organomegaly Extremities: extremities normal, atraumatic, no cyanosis or edema, Bilaterial severe below knee varicosities Neurologic: Grossly  normal  Diagnostic Studies & Laboratory data:     Recent Radiology Findings:   Mr Angiogram Chest W Wo Contrast  Result Date: 06/24/2016 CLINICAL DATA:  Evaluate thoracic aortic aneurysm. EXAM: MRA CHEST WITH CONTRAST TECHNIQUE: Angiographic images of the chest were obtained using MRA technique with intravenous contrast. CONTRAST:  44mL MULTIHANCE GADOBENATE DIMEGLUMINE 529 MG/ML IV SOLN COMPARISON:  Chest MRI - 07/09/2015 FINDINGS: Vascular Findings: Grossly unchanged fusiform aneurysmal dilatation of the ascending thoracic aorta was measurements as follows. The thoracic aorta tapers to a normal caliber at the level of the aortic arch. Bovine configuration of the aortic arch. The branch vessels of the aortic arch remain widely patent throughout their imaged course. Normal heart size.  No pericardial effusion. Although this examination was not tailored for the evaluation the pulmonary arteries, there are no discrete filling defects within the central pulmonary arterial tree to suggest central pulmonary embolism. Normal caliber of the main pulmonary artery. ------------------------------------------------------------- Thoracic aortic measurements: Sinotubular junction 43 mm as measured in greatest oblique sagittal dimension (image 42, series 1101). Proximal ascending aorta 51 mm as measured in greatest oblique sagittal dimension at the level of the main pulmonary artery (image 35, series 1101) and approximately 50 mm in greatest oblique axial diameter (image 47, series 12), unchanged since the 06/2015 examination. Aortic arch aorta 30 mm as measured in greatest oblique sagittal dimension (image 39, series 1101). Proximal descending thoracic aorta 29 mm as measured in greatest oblique axial dimension at the level of the main pulmonary artery (image 47, series 12). Distal descending thoracic aorta 27 mm as measured in greatest oblique axial dimension at the level of the diaphragmatic hiatus (sagittal image 27,  series 1101). Review of the MIP images confirms the above findings. ------------------------------------------------------------- Non-Vascular Findings: Mediastinum/Lymph Nodes: No bulky mediastinal, hilar axillary lymphadenopathy. Lungs/Pleura: No focal airspace opacities.  No pleural effusion. Upper abdomen: Limited visualization of the upper abdomen is unremarkable. Musculoskeletal: Regional osseous and soft tissue structures are unremarkable. Susceptibility artifact involves the medial aspect of the left chest, potentially at a location of a loop recorder device. IMPRESSION: Stable uncomplicated fusiform aneurysmal dilatation of the ascending thoracic aorta measuring 51 mm in greatest diameter, unchanged since the 06/2015 examination. Electronically Signed   By: Sandi Mariscal M.D.   On: 06/24/2016 16:34    CLINICAL DATA:  ?Bicuspid aortic valve, dilated ascending aorta, ischemic cardiomyopathy  EXAM: CARDIAC MRI  TECHNIQUE: The patient was scanned on a 1.5 Tesla GE magnet. A dedicated cardiac coil was used. Functional imaging was done using Fiesta sequences. 2,3, and 4 chamber views were done to assess for RWMA's. Modified Simpson's rule using a short axis stack was used to calculate an ejection fraction on a dedicated work Conservation officer, nature. The patient received 35 cc of Multihance. MR angiography was done. After 10 minutes inversion recovery sequences were used to assess for infiltration and scar tissue.  CONTRAST:  35 cc Multihance  FINDINGS: Limited images of the lung fields showed no significant abnormalities.  Mildly dilated left ventricle with moderate LV hypertrophy. The inferior, inferolateral, and anterolateral walls are hypokinetic. The calculated LV ejection fraction is 39%. Normal right ventricular size and systolic function. The right and left atrial sizes are normal. Probably mild mitral regurgitation. The aortic valve is bicuspid. There is mild aortic  insufficiency. Visually, there appears to be at least moderate aortic stenosis.  On delayed enhancement imaging, there was 26-50% wall thickness subendocardial late gadolinium enhancement (LGE) in a small area of the mid inferior wall. There is < 25% wall thickness subendocardial LGE in a small area of the mid inferolateral wall.  On MR angiography, there was dilation of the aortic root (4.1 cm) and ascending aorta (4.9 cm). There was a bovine arch. Pulmonary veins drained normally to the left atrium.  MEASUREMENTS: MEASUREMENTS LV EDV 274 mL  LV SV 108 mL  LV EF 39%  Aortic root 4.1 cm  Ascending aorta 4.9 cm in greatest dimension  Aortic arch 2.8 cm  Descending thoracic aorta 2.7 cm  IMPRESSION: 1. Mildly dillated LV with moderate LV hypertrophy. EF 39% with wall motion abnormalities as above.  2. Bicuspid aortic valve with associated moderate ascending aorta dilatation to maximal 4.9 cm.  The aortic valve is at least moderately stenotic visually, suggest correlation by echo.  3. Small areas of LGE in a coronary disease pattern in the inferior and inferolateral walls as described above.  Dalton Mclean   Electronically Signed   By: Loralie Champagne M.D.   On: 07/10/2015 17:48  I have independently reviewed the above radiology studies  and reviewed the findings with the patient.   cardiac cath with stents : 03/10/2015 Procedures   Coronary Stent Intervention  Conclusion   1. Prox RCA to Mid RCA lesion, diffuse 50% & focal 80% stenosed. A drug-eluting stent was placed. There is a 0% residual stenosis post intervention. 2. Dist RCA lesion, 50% stenosed. 3. Prox Cx lesion, 80% stenosed. A drug-eluting stent was placed. There is a 0% residual stenosis post intervention. 4. Ost 2nd Mrg to 2nd Mrg lesion & Ost 3rd Mrg to 3rd Mrg lesion, 100% stenosed. CTO with collateral filling 5. Ramus lesion, 60% stenosed. Mid LAD lesion, 50% stenosed.     Successful 2 vessel PCI involving a very complex/difficult intervention on extensive mid RCA lesion and proximal circumflex with a 90 takeoff of the circumflex.   Plan:  Standard post radial PCI care with TR band removal.  Brachial sheath can be removed 2 hours following discontinuation of Angiomax  Continue dual antiplatelet therapy for minimum one year  Continue aggressive cardiac risk factor modification and CHF treatment.  The patient does have moderate existing ramus intermedius disease which could potentially be a culprit for angina as well as the occluded OM 2 and OM 3 vessels.   ? The ramus is a potential PCI target, however the OM branches are likely not PCI target.  Patient disposition will be based on recommendation of the primary team/heart failure team. He likely requires additional CHF management prior to discharge.   Leonie Man, M.D., M.S. Interventional Cardiologist    ECHO:  Result status: Final result                              *[facility].                        [place], [place] [address]                            [phone]  ------------------------------------------------------------------- Echocardiography  Patient:    Abduljabbar, Koski MR #:       [id number] Study Date: 10/30/2015 Gender:     M Age:        80 Height:     180.3 cm Weight:     97.7 kg BSA:        2.24 m^2 Pt. Status: Room:   ATTENDING    Loralie Champagne, M.D.  ORDERING     Loralie Champagne, M.D.  REFERRING    Loralie Champagne, M.D.  SONOGRAPHER  Tresa Res, RDCS  PERFORMING   [facility], Outpatient  cc:  ------------------------------------------------------------------- LV EF: 40% -   45%  ------------------------------------------------------------------- Indications:      CHF - 428.0.  ------------------------------------------------------------------- History:    PMH:   Aortic  valve disease.  Risk factors:  Former tobacco use.  ------------------------------------------------------------------- Study Conclusions  - Left ventricle: The cavity size was mildly dilated. There was   mild focal basal hypertrophy of the septum. Systolic function was   mildly to moderately reduced. The estimated ejection fraction was   in the range of 40% to 45%. There is hypokinesis of the   inferolateral myocardium. Doppler parameters are consistent with   abnormal left ventricular relaxation (grade 1 diastolic   dysfunction). - Aortic valve: Possibly bicuspid; severely thickened, severely   calcified leaflets. Valve mobility was restricted. There was   moderate stenosis. There was moderate regurgitation directed   centrally in the LVOT, eccentrically in the LVOT, and towards the   mitral anterior leaflet. Peak velocity (S): 329 cm/s. Mean   gradient (S): 33 mm Hg. Valve area (VTI): 1.42 cm^2. Valve area   (Vmax): 1.22 cm^2. Valve area (Vmean): 1.13 cm^2. - Aorta: Aortic root dimension: 45 mm (ED). - Ascending aorta: The ascending aorta was mildly dilated. - Mitral valve: Calcified annulus. Mildly thickened, mildly   calcified leaflets . There was mild regurgitation. - Left atrium: The atrium was moderately dilated. - Pulmonary arteries: Systolic pressure was mildly increased.  Impressions:  - EF is mildly improved when compared to prior (35%) Prior ECHO   measured aortic root-27mm. Current 61mm. Consider CT or MRI to   obtain more precise measurement.  Echocardiography.  M-mode, complete 2D, spectral Doppler, and color Doppler.  Birthdate:  Patient birthdate: 1955-08-04.  Age:  Patient is 61 yr old.  Sex:  Gender: male.    BMI: 30.1 kg/m^2.  Blood pressure:     128/68  Patient status:  Outpatient.  Study date: Study date: 10/30/2015. Study time: 09:51 AM.  Location:   Echo laboratory.  -------------------------------------------------------------------  ------------------------------------------------------------------- Left ventricle:  The cavity size was mildly dilated. There was mild focal basal hypertrophy of the septum. Systolic function was mildly to moderately reduced. The estimated ejection fraction was in the range of 40% to 45%.  Regional wall motion abnormalities:   There is hypokinesis of the inferolateral myocardium. Doppler parameters are consistent with abnormal left ventricular relaxation (grade 1 diastolic dysfunction).  ------------------------------------------------------------------- Aortic valve:   Possibly bicuspid; severely thickened, severely calcified leaflets. Valve mobility was restricted.  Doppler: There was moderate stenosis.   There was moderate regurgitation directed centrally in the LVOT, eccentrically in the LVOT, and towards the mitral anterior leaflet.    VTI ratio of LVOT to aortic valve: 0.29. Valve area (VTI): 1.42 cm^2. Indexed valve area (VTI): 0.64 cm^2/m^2. Peak velocity ratio of LVOT to aortic valve: 0.25. Valve area (Vmax): 1.22 cm^2. Indexed valve area (Vmax): 0.55 cm^2/m^2. Mean velocity ratio of LVOT to aortic valve: 0.23. Valve area (Vmean): 1.13 cm^2. Indexed valve area (Vmean): 0.51 cm^2/m^2.    Mean gradient (S): 33 mm Hg. Peak gradient (S): 43 mm Hg.  ------------------------------------------------------------------- Aorta:  Ascending aorta: The ascending aorta was mildly dilated.  ------------------------------------------------------------------- Mitral valve:   Calcified annulus. Mildly thickened, mildly calcified leaflets . Mobility was not restricted.  Doppler: Transvalvular velocity was within the normal range. There was no evidence for stenosis. There was mild regurgitation.    Peak gradient (D): 2 mm  Hg.  ------------------------------------------------------------------- Left atrium:  The atrium was moderately dilated.  ------------------------------------------------------------------- Right ventricle:  The cavity size was normal. Wall thickness was normal. Systolic function was normal.  ------------------------------------------------------------------- Pulmonic valve:   Poorly visualized.  Structurally normal valve. Cusp separation was normal.  Doppler:  Transvalvular velocity was within the normal range. There was no evidence for stenosis. There was no regurgitation.  ------------------------------------------------------------------- Tricuspid valve:   Structurally normal valve.    Doppler: Transvalvular velocity was within the normal range. There was trivial regurgitation.  ------------------------------------------------------------------- Pulmonary artery:   The main pulmonary artery was normal-sized. Systolic pressure was mildly increased.  ------------------------------------------------------------------- Right atrium:  The atrium was normal in size.  ------------------------------------------------------------------- Pericardium:  A prominent pericardial fat pad was present. There was no pericardial effusion.  ------------------------------------------------------------------- Systemic veins: Inferior vena cava: The vessel was normal in size.  ------------------------------------------------------------------- Measurements   Left ventricle                            Value          Reference  LV ID, ED, PLAX chordal            (H)    63.9  mm       43 - 52  LV ID, ES, PLAX chordal            (H)    51.3  mm       23 - 38  LV fx shortening, PLAX chordal     (L)    20    %        >=29  LV PW thickness, ED                       11.3  mm       ---------  IVS/LV PW ratio, ED                       1.09           <=1.3  Stroke volume, 2D                          109   ml       ---------  Stroke volume/bsa, 2D                     49    ml/m^2   ---------  LV ejection fraction, 1-p A4C             56    %        ---------  LV end-diastolic volume, 2-p              190   ml       ---------  LV end-systolic volume, 2-p               105   ml       ---------  LV ejection fraction, 2-p                 45    %        ---------  Stroke volume, 2-p                        85    ml       ---------  LV end-diastolic volume/bsa, 2-p          85    ml/m^2   ---------  LV end-systolic volume/bsa, 2-p           47    ml/m^2   ---------  Stroke volume/bsa, 2-p  38    ml/m^2   ---------  LV e&', lateral                            4.9   cm/s     ---------  LV E/e&', lateral                          15.45          ---------  LV e&', medial                             5.98  cm/s     ---------  LV E/e&', medial                           12.66          ---------  LV e&', average                            5.44  cm/s     ---------  LV E/e&', average                          13.92          ---------  Longitudinal strain, TDI                  14    %        ---------    Ventricular septum                        Value          Reference  IVS thickness, ED                         12.3  mm       ---------    LVOT                                      Value          Reference  LVOT ID, S                                25    mm       ---------  LVOT area                                 4.91  cm^2     ---------  LVOT peak velocity, S                     82    cm/s     ---------  LVOT mean velocity, S                     56    cm/s     ---------  LVOT VTI, S  22.2  cm       ---------    Aortic valve                              Value          Reference  Aortic valve peak velocity, S             329   cm/s     ---------  Aortic valve mean velocity, S             243   cm/s     ---------  Aortic valve VTI, S                        76.8  cm       ---------  Aortic mean gradient, S                   33    mm Hg    ---------  Aortic peak gradient, S                   43    mm Hg    ---------  VTI ratio, LVOT/AV                        0.29           ---------  Aortic valve area, VTI                    1.42  cm^2     ---------  Aortic valve area/bsa, VTI                0.64  cm^2/m^2 ---------  Velocity ratio, peak, LVOT/AV             0.25           ---------  Aortic valve area, peak velocity          1.22  cm^2     ---------  Aortic valve area/bsa, peak               0.55  cm^2/m^2 ---------  velocity  Velocity ratio, mean, LVOT/AV             0.23           ---------  Aortic valve area, mean velocity          1.13  cm^2     ---------  Aortic valve area/bsa, mean               0.51  cm^2/m^2 ---------  velocity    Aorta                                     Value          Reference  Aortic root ID, ED                        45    mm       ---------    Left atrium                               Value          Reference  LA ID, A-P, ES                            49    mm       ---------  LA ID/bsa, A-P                            2.19  cm/m^2   <=2.2  LA volume, ES, 1-p A4C                    47.7  ml       ---------  LA volume/bsa, ES, 1-p A4C                21.3  ml/m^2   ---------  LA volume, ES, 1-p A2C                    100   ml       ---------  LA volume/bsa, ES, 1-p A2C                44.7  ml/m^2   ---------    Mitral valve                              Value          Reference  Mitral E-wave peak velocity               75.7  cm/s     ---------  Mitral A-wave peak velocity               77.1  cm/s     ---------  Mitral deceleration time           (H)    408   ms       150 - 230  Mitral peak gradient, D                   2     mm Hg    ---------  Mitral E/A ratio, peak                    1              ---------    Pulmonary arteries                        Value          Reference  PA pressure, S, DP                         30    mm Hg    <=30    Tricuspid valve                           Value          Reference  Tricuspid regurg peak velocity            259   cm/s     ---------  Tricuspid peak RV-RA gradient             27    mm Hg    ---------    Systemic veins  Value          Reference  Estimated CVP                             3     mm Hg    ---------    Right ventricle                           Value          Reference  RV pressure, S, DP                        30    mm Hg    <=30  RV s&', lateral, S                         18.1  cm/s     ---------  Legend: (L)  and  (H)  mark values outside specified reference range.  ------------------------------------------------------------------- Prepared and Electronically Authenticated by  Candee Furbish, M.D. 2017-04-21T10:59:44     Recent Lab Findings: Lab Results  Component Value Date   WBC 8.9 03/11/2015   HGB 14.4 03/11/2015   HCT 42.6 03/11/2015   PLT 230 03/11/2015   GLUCOSE 97 05/27/2016   CHOL 180 01/08/2016   TRIG 295 (H) 01/08/2016   HDL 38 (L) 01/08/2016   LDLCALC 83 01/08/2016   ALT 17 09/18/2015   AST 21 09/18/2015   NA 135 05/27/2016   K 4.4 05/27/2016   CL 104 05/27/2016   CREATININE 0.94 05/27/2016   BUN 20 05/27/2016   CO2 25 05/27/2016   TSH 0.941 03/09/2015   INR 1.07 03/10/2015   HGBA1C 5.6 03/09/2015   Aortic Size Index=    5.0     /Body surface area is 2.2 meters squared. = 2.27  < 2.75 cm/m2      4% risk per year 2.75 to 4.25          8% risk per year > 4.25 cm/m2    20% risk per year     Assessment / Plan:    1Possibly bicuspid Aortic ; severely thickened, severely calcified leaflets. There is aortic moderate stenosis.   There was aortic moderate regurgitation  2 CAD with drug eluding  stents x2 placed 02/2015 3 depressed EF 40-45 % by echo 4 uncomplicated fusiform aneurysmal dilatation of the ascending thoracic aorta measuring 51 mm in greatest diameter,  unchanged since the 06/2015  I have discussed with the patient the MRA findings and natural history of bicuspid aortic valves with associated dilated aortic valve, currently patient is asymptomatic from aortic valve but combination of his findings requires  close follow up and likely AVR/ ascending aortic replacement possible cabg.   I Will see patient back in 6 months with repeat cta of chest and follow up echo ( scheduled 10/2016), he will need repeat cath before surgery. I have discussed with him and his wife risk of dissection, need to avoid hypertension , heavy strenuous  lifting or valsalva. We also discussed surgical replacement of AVR and ascending aorta, including alternatives of mechanical vs tissue valves    I  spent 40 minutes counseling the patient face to face and 50% or more the  time was spent in counseling and coordination of care. The total time spent in the appointment was 60 minutes.  Grace Isaac  MD      [address] [place] [address] Office [phone]   Beeper [phone]  07/20/2016 7:19 AM

## 2016-09-03 ENCOUNTER — Other Ambulatory Visit (HOSPITAL_COMMUNITY): Payer: Self-pay | Admitting: Cardiology

## 2016-09-23 ENCOUNTER — Telehealth (HOSPITAL_COMMUNITY): Payer: Self-pay

## 2016-09-23 NOTE — Telephone Encounter (Signed)
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 Aundra Dubin to review.  Renee Pain, RN

## 2016-09-23 NOTE — Telephone Encounter (Signed)
That would be fine, may send letter.

## 2016-09-26 ENCOUNTER — Encounter (HOSPITAL_COMMUNITY): Payer: Self-pay

## 2016-10-12 ENCOUNTER — Encounter (HOSPITAL_COMMUNITY): Payer: BLUE CROSS/BLUE SHIELD | Admitting: Internal Medicine

## 2016-10-21 ENCOUNTER — Ambulatory Visit (HOSPITAL_BASED_OUTPATIENT_CLINIC_OR_DEPARTMENT_OTHER)
Admission: RE | Admit: 2016-10-21 | Discharge: 2016-10-21 | Disposition: A | Discharge status: Home / Self Care | Payer: BLUE CROSS/BLUE SHIELD | Source: Ambulatory Visit | Attending: Cardiology | Admitting: Cardiology

## 2016-10-21 ENCOUNTER — Encounter (HOSPITAL_COMMUNITY): Payer: Self-pay

## 2016-10-21 ENCOUNTER — Ambulatory Visit (HOSPITAL_COMMUNITY)
Admission: RE | Admit: 2016-10-21 | Discharge: 2016-10-21 | Disposition: A | Discharge status: Home / Self Care | Payer: BLUE CROSS/BLUE SHIELD | Source: Ambulatory Visit | Attending: Cardiology | Admitting: Cardiology

## 2016-10-21 VITALS — BP 133/93 | HR 57 | Wt 218.0 lb

## 2016-10-21 DIAGNOSIS — Q231 Congenital insufficiency of aortic valve: Secondary | ICD-10-CM

## 2016-10-21 DIAGNOSIS — I5022 Chronic systolic (congestive) heart failure: Secondary | ICD-10-CM | POA: Insufficient documentation

## 2016-10-21 DIAGNOSIS — E782 Mixed hyperlipidemia: Secondary | ICD-10-CM

## 2016-10-21 DIAGNOSIS — I712 Thoracic aortic aneurysm, without rupture: Secondary | ICD-10-CM

## 2016-10-21 DIAGNOSIS — I7121 Aneurysm of the ascending aorta, without rupture: Secondary | ICD-10-CM

## 2016-10-21 LAB — LIPID PANEL
Cholesterol: 234 mg/dL — ABNORMAL HIGH (ref 0–200)
HDL: 43 mg/dL (ref >40)
LDL CALC: 126 mg/dL — AB (ref 0–99)
Total CHOL/HDL Ratio: 5.4 RATIO
Triglycerides: 323 mg/dL — ABNORMAL HIGH (ref <150)
VLDL: 65 mg/dL — ABNORMAL HIGH (ref 0–40)

## 2016-10-21 LAB — BASIC METABOLIC PANEL
Anion gap: 11 (ref 5–15)
BUN: 17 mg/dL (ref 6–20)
CHLORIDE: 103 mmol/L (ref 101–111)
CO2: 24 mmol/L (ref 22–32)
CREATININE: 0.91 mg/dL (ref 0.61–1.24)
Calcium: 10 mg/dL (ref 8.9–10.3)
GFR calc Af Amer: >60 mL/min (ref >60)
GFR calc non Af Amer: >60 mL/min (ref >60)
Glucose, Bld: 90 mg/dL (ref 65–99)
Potassium: 4.2 mmol/L (ref 3.5–5.1)
SODIUM: 138 mmol/L (ref 135–145)

## 2016-10-21 NOTE — Patient Instructions (Signed)
Routine lab work today. Will notify you of abnormal results, otherwise no news is good news!  Will refer you back to Dr. Servando Snare in June/July to discuss options for aortic valve replacement. Address: [address], [place], [place] [address] Phone: [phone]  Follow up with Dr. Aundra Dubin in 3 months.  Do the following things EVERYDAY: 1) Weigh yourself in the morning before breakfast. Write it down and keep it in a log. 2) Take your medicines as prescribed 3) Eat low salt foods-Limit salt (sodium) to 2000 mg per day.  4) Stay as active as you can everyday 5) Limit all fluids for the day to less than 2 liters

## 2016-10-23 NOTE — Progress Notes (Signed)
Patient ID: William Contreras, male   DOB: 1955/11/03, 61 y.o.   MRN: [id number] PCP: Dr. Lin Landsman Cardiology: Dr. Aundra Dubin  61 yo with history of HTN was admitted in 8/16 with acute systolic CHF => volume overloaded, short of breath.  Troponin was mildly elevated to 0.04.  Echo showed EF 30-35%.  He was diuresed and had cardiac cath.  This showed diffuse severe disease but the LAD disease was relatively mild to CABG not recommended.  He had PCI with DES to mLCx and proximal to mid RCA.  Cardiac MRI/MRA chest showed EF 39%, bicuspid aortic valve with probable moderate aortic stenosis, 4.9 cm ascending aorta.  MRA in 12/17 showed ascending aorta dimension up to 5.1 cm.  He saw Dr. Servando Snare for aorta evaluation.  I reviewed today's echo.  EF 45% with inferolateral hypokinesis, moderate AS with mean gradient 38 mmHg and AVA 1.3 cm^2, mild to moderate aortic insufficiency, bicuspid aortic valve, ascending aorta 5.1 cm.   He is currently doing well.  SBP in 130s today but runs 100s-120s at home when he checks (checks daily).  No chest pain.  Short of breath only when he walks up a steep hill.  Working full time, does a lot of lifting/carrying with his work, has been told to cut back on the heavy lifting.  Exercising regularly.  Walks for 30 minutes on treadmill most days.  Watching diet.      Labs (8/16): K 4.1, creatinine 1.0, HCT 42.6, LDL 200 Labs (9/16): K 4, creatinine 1.04, BNP 383 Labs (11/16): K 4.1, creatinine 0.99 Labs (12/16): LDL 152, HDL 37 (not taking Crestor) Labs (3/17): LDL 92, HDL 36, K 4.1, creatinine 0.92 Labs (6/17): LDL 83, HDL 38, K 3.8, creatinine 1.04 Labs (11/17): creatinine 0.94  PMH: 1. HTN 2. CAD: NSTEMI/CHF in 8/16.  LHC (8/16) showed 40-50% mLAD, 50% prox ramus, 80% mLCx, total occlusion moderate OM2, 50% pRCA, 80% mRCA, 50% dRCA.  He had DES to prox and mid RCA and mid LCx.   3. Chronic systolic CHF: Ischemic cardiomyopathy.   - Echo (8/16) with EF 30-35%, diffuse hypokinesis,  mild LVH, ?bicuspid aortic valve with mild AS and mild AI, 5.1 cm aortic root.  - Cardiac MRI (12/16): EF 39%, mild LV dilation and moderate LVH, inferior/inferolateral/anterolateral hypokinesis, LGE in the inferior and inferolateral walls, normal RV size and systolic function, mild aortic insufficiency, bicuspid aortic valve with probable moderate aortic stenosis.  - Echo (4/17): EF 40-45%, inferolateral hypokinesis, bicuspid aortic valve with moderate aortic stenosis (mean gradient 33 mmHg), moderate AI, 4.5 cm aortic root. - Echo (4/18): EF 45% with inferolateral hypokinesis, moderate AS with mean gradient 38 mmHg and AVA 1.3 cm^2, mild to moderate aortic insufficiency, bicuspid aortic valve, ascending aorta 5.1 cm.  4. Hyperlipidemia 5. Bicuspid aortic valve: Moderate AS by 12/16 MRI, mild AS by 8/16 echo.  Moderate AS/moderate AI by 4/17 echo.  - Echo (4/18) with moderate AS and mild to moderate AI.  Mean AoV gradient 38 mmHg with AVA 1.3 cm^2.  6. Ascending aortic aneurysm: 4.9 cm ascending aorta by MRA chest in 12/16.  Aortic root 4.5 cm on 4/17 echo.  - MRA chest 12/17 with 5.1 cm ascending aorta.  - Echo 4/18 with 5.1 cm ascending aorta.  7. Carotid dopplers (7/17) with no significant disease.   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  Lives in [place].   FH: No CAD that he knows of. +HTN.  No  FH of aneurysm or bicuspid aortic valve.   ROS: All systems reviewed and negative except as per HPI.   Current Outpatient Prescriptions  Medication Sig Dispense Refill  . aspirin EC 81 MG tablet Take 81 mg by mouth daily.    . calcium citrate-vitamin D (CITRACAL+D) 315-200 MG-UNIT tablet Take 1 tablet by mouth daily.    . carvedilol (COREG) 6.25 MG tablet TAKE ONE TABLET BY MOUTH TWICE DAILY 60 tablet 11  . Coenzyme Q10 (COQ10) 200 MG CAPS Take 200 mg by mouth daily. 30 capsule 2  . Flaxseed, Linseed, (FLAXSEED OIL) 1000 MG CAPS Take 1,000 mg by mouth  daily.    . fluticasone (FLONASE) 50 MCG/ACT nasal spray Place 2 sprays into both nostrils daily as needed for allergies or rhinitis.    . Iron-Vitamins (GERITOL COMPLETE) TABS Take 1 tablet by mouth daily.    . Lactobacillus (ULTIMATE PROBIOTIC FORMULA) CAPS Take 1 capsule by mouth daily.    Marland Kitchen lisinopril (PRINIVIL,ZESTRIL) 2.5 MG tablet TAKE ONE TABLET BY MOUTH TWICE DAILY 60 tablet 3  . Magnesium 400 MG TABS Take 400 mg by mouth daily.    . Omega-3 Fatty Acids (FISH OIL) 1000 MG CAPS 2 grams po BID  0  . rosuvastatin (CRESTOR) 10 MG tablet TAKE ONE TABLET BY MOUTH ONCE DAILY 30 tablet 3  . spironolactone (ALDACTONE) 25 MG tablet TAKE ONE-HALF TABLET BY MOUTH ONCE DAILY 15 tablet 3   No current facility-administered medications for this encounter.    BP (!) 133/93   Pulse (!) 57   Wt 218 lb (98.9 kg)   SpO2 100%   BMI 30.40 kg/m  General: NAD Neck: Thick, JVP 7 cm, no thyromegaly or thyroid nodule.  Lungs: Clear to auscultation bilaterally with normal respiratory effort. CV: Nondisplaced PMI.  Heart regular S1/S2, no S3/S4, 3/6 SEM RUSB, can hear S2 clearly.  No edema.  Left carotid bruit.  Normal pedal pulses.  Abdomen: Soft, nontender, no hepatosplenomegaly, no distention.  Skin: Intact without lesions or rashes.  Neurologic: Alert and oriented x 3.  Psych: Normal affect. Extremities: No clubbing or cyanosis.  HEENT: Normal.   Assessment/Plan: 1. CAD: S/p ACS 8/16 with DES to mLCx and proximal to mid RCA.  No chest pain.  - If he undergoes AVR/ascending aorta replacement, he will need pre-op cath.  - Continue ASA 81 and Crestor.   2. Chronic systolic CHF: Ischemic cardiomyopathy.  EF 30-35% by echo in the hospital.  Cardiac MRI showed EF up to 39% in 12/16.  Echo 4/17 showed EF improved to 40-45%.  Echo today with EF 45%.  NYHA class II symptoms now.  - Continue current Coreg and spironolactone.  - Keep lisinopril at 2.5 mg qhs.  He got lightheaded when he tried to increase  lisinopril after last appointment. - BMET today.  3. Bicuspid aortic valve: moderate AI, moderate AS by echo in 4/17.  I will get a repeat echo in 4/18 to reassess the valve.  4. Hyperlipidemia: Check lipids today.  5. Ascending aortic aneurysm: 4.9 cm on MRA chest in 12/16.  Likely related to bicuspid aortic valve.  4.5 cm aortic root on 4/17 echo.  MRA in 12/17 and echo in 4/18 showed increase in ascending aorta dimension to 5.1 cm.  He has seen Dr. Servando Snare for aorta evaluation.  No family history of aneurysm or rupture, so no definite indication to replace ascending aorta at 5.0 cm.  5-5.5 cm is something of a gray zone in his  situation. He will see Dr. Servando Snare this summer with another MRA or CTA.  If it enlarges any, he will need ascending aorta replacement and AVR.  He is leaning towards biosprosthetic AVR on our discussion today.  We also discussed limiting his lifting => would like to see him avoid straining and to keep lifting < 30-40 lbs.   Followup in 3 months.    Loralie Champagne 10/23/2016

## 2016-11-07 ENCOUNTER — Encounter (HOSPITAL_COMMUNITY): Payer: Self-pay

## 2016-11-07 NOTE — Progress Notes (Signed)
Disability paperwork placed in Dr. Claris Gladden folder to review and complete as patient works full time with heavy weight lifting, commercial driving, climbing, etc. Will fax on patient's behalf and place copy of forms in patient's electronic medical record once paperwork completed and signed by Dr. Aundra Dubin.  Renee Pain, RN

## 2016-11-28 ENCOUNTER — Encounter (HOSPITAL_COMMUNITY): Payer: Self-pay

## 2016-11-28 NOTE — Progress Notes (Signed)
Disability paperwork compelted on behalf of patient. Patient requesting to return to work, however, patient's job descriptions lists heavy lifting and strenuous work. Dr. Aundra Dubin wrote patient back to work full time with restrictions not to lift/push/pull over 30 lbs and rest breaks (10 min) as needed. Paperwork faxed to provided # [phone] attn: Adah Salvage. Copy of paperwork scanned into patient's electronic medical record.  Renee Pain, RN

## 2016-12-21 ENCOUNTER — Other Ambulatory Visit: Payer: Self-pay | Admitting: Cardiothoracic Surgery

## 2016-12-21 DIAGNOSIS — I712 Thoracic aortic aneurysm, without rupture, unspecified: Secondary | ICD-10-CM

## 2016-12-27 ENCOUNTER — Other Ambulatory Visit (HOSPITAL_COMMUNITY): Payer: Self-pay | Admitting: Cardiology

## 2016-12-27 ENCOUNTER — Other Ambulatory Visit (HOSPITAL_COMMUNITY): Payer: Self-pay | Admitting: Internal Medicine

## 2017-01-23 ENCOUNTER — Other Ambulatory Visit: Payer: Self-pay | Admitting: Cardiothoracic Surgery

## 2017-01-26 ENCOUNTER — Encounter: Payer: Self-pay | Admitting: Cardiothoracic Surgery

## 2017-01-26 ENCOUNTER — Ambulatory Visit
Admission: RE | Admit: 2017-01-26 | Discharge: 2017-01-26 | Disposition: A | Discharge status: Home / Self Care | Payer: BLUE CROSS/BLUE SHIELD | Source: Ambulatory Visit | Attending: Cardiothoracic Surgery | Admitting: Cardiothoracic Surgery

## 2017-01-26 ENCOUNTER — Ambulatory Visit (INDEPENDENT_AMBULATORY_CARE_PROVIDER_SITE_OTHER): Payer: BLUE CROSS/BLUE SHIELD | Admitting: Cardiothoracic Surgery

## 2017-01-26 VITALS — BP 110/74 | HR 49 | Resp 16 | Ht 71.0 in | Wt 212.0 lb

## 2017-01-26 DIAGNOSIS — I712 Thoracic aortic aneurysm, without rupture, unspecified: Secondary | ICD-10-CM

## 2017-01-26 DIAGNOSIS — I7121 Aneurysm of the ascending aorta, without rupture: Secondary | ICD-10-CM

## 2017-01-26 MED ORDER — IOPAMIDOL (ISOVUE-370) INJECTION 76%
75.0000 mL | Freq: Once | INTRAVENOUS | Status: AC | PRN
  Administered 2017-01-26: 75 mL via INTRAVENOUS

## 2017-01-26 NOTE — Progress Notes (Signed)
William Contreras       [place] [address]             [phone]                    William Contreras Medical Record #[id number] Date of Birth: 01-26-1956  Referring: William Dresser, MD Primary Care: William Sheriff, MD  Chief Complaint:    Chief Complaint  Patient presents with  . TAA    6 month f/u with CTA CHEST    History of Present Illness:    William Contreras 61 y.o. male is seen in the office  today after Repeat CTA of the chest . I first saw him in January 2018 after a MRI of the chest. Since I last saw him he notes increasing symptoms of shortness of breath with exertion and chest discomfort. He has had no syncope or presyncope.   He presented to cardiology in 8/16 with acute systolic CHF .  Troponin was mildly elevated to 0.04.  Echo showed EF 30-35%. Cardiac Cath showed diffuse severe disease but the LAD disease was relatively mild .  He had PCI with DES to mLCx and proximal to mid RCA.  Cardiac MRI/MRA chest showed EF 39%, bicuspid aortic valve with probable moderate aortic stenosis, 4.9 cm ascending aorta.  Most recent echo in 4/17 with EF 40-45%, moderate AS/moderate AI, 4.5 cm aortic root.     He has no family history of bicuspid valve, dilated aorta , aortic dissection or sudden unexplaind death of 1st degree relatives. His father died in his 34's in MVA, mother is alive at 56. Has brother and sister without known cardiac disease.     Current Activity/ Functional Status:  Patient is independent with mobility/ambulation, transfers, ADL's, IADL's.   Zubrod Score: At the time of surgery this patient's most appropriate activity status/level should be described as: [x]     0    Normal activity, no symptoms []     1    Restricted in physical strenuous activity but ambulatory, able to do out light work []     2    Ambulatory and capable of self care, unable to do work activities, up and about               >50 % of waking hours                               []     3    Only limited self care, in bed greater than 50% of waking hours []     4    Completely disabled, no self care, confined to bed or chair []     5    Moribund   Past Medical History:  Diagnosis Date  . Essential hypertension   . Morbid obesity ([place])     Past Surgical History:  Procedure Laterality Date  . CARDIAC CATHETERIZATION N/A 03/10/2015   Procedure: Right/Left Heart Cath and Coronary Angiography;  Surgeon: William Dresser, MD;  Location: [facility];  Service: Cardiovascular;  Laterality: N/A;  . CARDIAC CATHETERIZATION N/A 03/10/2015   Procedure: Coronary Stent Intervention;  Surgeon: Leonie Man, MD;  Location: [facility];  Service: Cardiovascular;  Laterality: N/A;  . TONSILLECTOMY      Family History  Problem Relation Age of Onset  . Other Father  died young in [place].  Marland Kitchen Heart attack Mother        alive @ 80.  . Other Brother        A & W  . Other Sister        A & W    Social History   Social History  . Marital status: Married    Spouse name: N/A  . Number of children: N/A  . Years of education: N/A   Occupational History  . Not on file.   Social History Main Topics  . Smoking status: Former Smoker    Packs/day: 1.00    Years: 15.00    Types: Cigarettes  . Smokeless tobacco: Never Used     Comment: quit smoking ~ 20 yrs ago.  . Alcohol use No     Comment: prev drank - none in ~ 25 yrs.  . Drug use: No     Comment: prev smoked MJ, none in 25 yrs.  . Sexual activity: Yes   Other Topics Concern  . Not on file   Social History Narrative   Pt lives in [place] with his wife and son.  He has 2 step children and another child - all grown and out of the house.    History  Smoking Status  . Former Smoker  . Packs/day: 1.00  . Years: 15.00  . Types: Cigarettes  Smokeless Tobacco  . Never Used    Comment: quit smoking ~ 20 yrs ago.    History  Alcohol Use No    Comment: prev drank - none  in ~ 25 yrs.     Allergies  Allergen Reactions  . Lipitor [Atorvastatin] Other (See Comments)    Joint and head pains  . Penicillins Other (See Comments)    Lightheadness    Current Outpatient Prescriptions  Medication Sig Dispense Refill  . aspirin EC 81 MG tablet Take 81 mg by mouth daily.    . calcium citrate-vitamin D (CITRACAL+D) 315-200 MG-UNIT tablet Take 1 tablet by mouth daily.    . carvedilol (COREG) 6.25 MG tablet TAKE ONE TABLET BY MOUTH TWICE DAILY 60 tablet 11  . Coenzyme Q10 (COQ10) 200 MG CAPS Take 200 mg by mouth daily. 30 capsule 2  . Flaxseed, Linseed, (FLAXSEED OIL) 1000 MG CAPS Take 1,000 mg by mouth daily.    . fluticasone (FLONASE) 50 MCG/ACT nasal spray Place 2 sprays into both nostrils daily as needed for allergies or rhinitis.    . Iron-Vitamins (GERITOL COMPLETE) TABS Take 1 tablet by mouth daily.    . Lactobacillus (ULTIMATE PROBIOTIC FORMULA) CAPS Take 1 capsule by mouth daily.    Marland Kitchen lisinopril (PRINIVIL,ZESTRIL) 2.5 MG tablet TAKE ONE TABLET BY MOUTH TWICE DAILY (Patient taking differently: TAKE ONE TABLET BY MOUTH ONCE DAILY) 60 tablet 3  . Magnesium 400 MG TABS Take 400 mg by mouth daily.    . Omega-3 Fatty Acids (FISH OIL) 1000 MG CAPS 2 grams po BID  0  . rosuvastatin (CRESTOR) 10 MG tablet TAKE ONE TABLET BY MOUTH ONCE DAILY 30 tablet 3  . spironolactone (ALDACTONE) 25 MG tablet TAKE 1/2 (ONE-HALF) TABLET BY MOUTH ONCE DAILY 15 tablet 3   No current facility-administered medications for this visit.       Review of Systems:     Cardiac Review of Systems: Y or N  Chest Pain [  n  ]  Resting SOB [ n  ] Exertional SOB n [  ]  Orthopnea [  n ]   Pedal Edema [ n  ]    Palpitations [ n ] Syncope  [n  ]   Presyncope [  n ]  General Review of Systems: [Y] = yes [  ]=no Constitional: recent weight change [ n ];  Wt loss over the last 3 months [   ] anorexia [  ]; fatigue [  ]; nausea [  ]; night sweats [  ]; fever [  ]; or chills [  ];          Dental:  poor dentition[  n]; Last Dentist visit: every 6 months sees dentist   Eye : blurred vision [  ]; diplopia [   ]; vision changes [  ];  Amaurosis fugax[  ]; Resp: cough [  ];  wheezing[  ];  hemoptysis[  ]; shortness of breath[  ]; paroxysmal nocturnal dyspnea[  ]; dyspnea on exertion[  ]; or orthopnea[  ];  GI:  gallstones[n  ], vomiting[  ];  dysphagia[  ]; melena[  ];  hematochezia [  ]; heartburn[  ];   Hx of  Colonoscopy[  ]; GU: kidney stones [  ]; hematuria[  ];   dysuria [  ];  nocturia[  ];  history of     obstruction [  ]; urinary frequency [  ]             Skin: rash, swelling[  ];, hair loss[  ];  peripheral edema[n  ];  or itching[  ]; Musculosketetal: myalgias[  ];  joint swelling[  ];  joint erythema[  ];  joint pain[  ];  back pain[  ];  Heme/Lymph: bruising[  ];  bleeding[  ];  anemia[  ];  Neuro: TIA[  ];  headaches[  ];  stroke[  ];  vertigo[  ];  seizures[  ];   paresthesias[  ];  difficulty walking[n  ];  Psych:depression[  ]; anxiety[  ];  Endocrine: diabetes[n  ];  thyroid dysfunction[n  ];  Immunizations: Flu up to date Florencio.Farrier  ]; Pneumococcal up to date [ n ];  Other:  Physical Exam: BP 110/74 (BP Location: Right Arm, Patient Position: Sitting, Cuff Size: Large)   Pulse (!) 49   Resp 16   Ht 5\' 11"  (1.803 m)   Wt 212 lb (96.2 kg)   SpO2 98% Comment: ON RA  BMI 29.57 kg/m   PHYSICAL EXAMINATION: General appearance: alert, cooperative and appears stated age in no distress Head: Normocephalic, without obvious abnormality, atraumatic, does not have any appearance of genetic aortopathy  Neck: no adenopathy, no carotid bruit, no JVD, supple, symmetrical, trachea midline and thyroid not enlarged, symmetric, no tenderness/mass/nodules Lymph nodes: Cervical supra Fick actually lymph nodes not enlarged Resp: Lungs are clear bilaterally Back: symmetric, no curvature. ROM normal. No CVA tenderness. Cardio: 3/6 holosystolic murmur along the right sternal border  GI: soft,  non-tender; bowel sounds normal; no masses,  no organomegaly Extremities: extremities normal, atraumatic, no cyanosis or edema, Bilaterial severe below knee varicosities no obvious varicosities and upper legs Neurologic: Grossly normal  Diagnostic Studies & Laboratory data:     Recent Radiology Findings:   Ct Angio Chest Aorta W &/or Wo Contrast  Result Date: 01/26/2017 CLINICAL DATA:  Pre-op for aneurysm repair and aortic valve replacement. Slight chest pain, SOB on exertion. Fatigue. Nonsmoker over 20 yrs. Heart stents x 2. No hx ca. EXAM: CT ANGIOGRAPHY CHEST WITH CONTRAST TECHNIQUE: Multidetector CT imaging of the chest  was performed using the standard protocol during bolus administration of intravenous contrast. Multiplanar CT image reconstructions and MIPs were obtained to evaluate the vascular anatomy. CONTRAST:  75 cc Isovue 370 COMPARISON:  MRA chest dated 06/24/2016. FINDINGS: Cardiovascular: Ascending thoracic aorta measures 4.9 cm diameter. Atherosclerosis at the aortic valve. Aortic arch and descending thoracic aorta are normal in caliber. Heart size is normal. No pericardial effusion. Coronary artery calcifications. Mediastinum/Nodes: No mass or enlarged lymph nodes within the mediastinum or perihilar regions. Esophagus appears normal. Trachea and central bronchi are unremarkable. Lungs/Pleura: Lungs are clear.  No pleural effusion or pneumothorax. Upper Abdomen: Limited images of the upper abdomen are unremarkable. Musculoskeletal: Degenerative changes throughout the slightly kyphotic thoracic spine, mild to moderate in degree, with evidence of DISH. No acute or suspicious osseous finding. Review of the MIP images confirms the above findings. IMPRESSION: 1. No acute findings. No aortic dissection. No pneumonia or pulmonary edema. No pericardial effusion. 2. 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 3. Aortic atherosclerosis. 4. Coronary artery calcifications, particularly dense within the left main and left circumflex coronary arteries. Recommend correlation with any possible associated cardiac symptoms. Aortic aneurysm NOS (ICD10-I71.9). Aortic Atherosclerosis (ICD10-I70.0). Electronically Signed   By: Franki Cabot M.D.   On: 01/26/2017 15:40   I have independently reviewed the above radiology studies  and reviewed the findings with the patient.    Mr Angiogram Chest W Wo Contrast  Result Date: 06/24/2016 CLINICAL DATA:  Evaluate thoracic aortic aneurysm. EXAM: MRA CHEST WITH CONTRAST TECHNIQUE: Angiographic images of the chest were obtained using MRA technique with intravenous contrast. CONTRAST:  28mL MULTIHANCE GADOBENATE DIMEGLUMINE 529 MG/ML IV SOLN COMPARISON:  Chest MRI - 07/09/2015 FINDINGS: Vascular Findings: Grossly unchanged fusiform aneurysmal dilatation of the ascending thoracic aorta was measurements as follows. The thoracic aorta tapers to a normal caliber at the level of the aortic arch. Bovine configuration of the aortic arch. The branch vessels of the aortic arch remain widely patent throughout their imaged course. Normal heart size.  No pericardial effusion. Although this examination was not tailored for the evaluation the pulmonary arteries, there are no discrete filling defects within the central pulmonary arterial tree to suggest central pulmonary embolism. Normal caliber of the main pulmonary artery. ------------------------------------------------------------- Thoracic aortic measurements: Sinotubular junction 43 mm as measured in greatest oblique sagittal dimension (image 42, series 1101). Proximal ascending aorta 51 mm as measured in greatest oblique sagittal dimension at the level of the main pulmonary artery (image 35, series 1101) and approximately 50 mm in greatest oblique axial diameter (image 47, series 12), unchanged since  the 06/2015 examination. Aortic arch aorta 30 mm as measured in greatest oblique sagittal dimension (image 39, series 1101). Proximal descending thoracic aorta 29 mm as measured in greatest oblique axial dimension at the level of the main pulmonary artery (image 47, series 12). Distal descending thoracic aorta 27 mm as measured in greatest oblique axial dimension at the level of the diaphragmatic hiatus (sagittal image 27, series 1101). Review of the MIP images confirms the above findings. ------------------------------------------------------------- Non-Vascular Findings: Mediastinum/Lymph Nodes: No bulky mediastinal, hilar axillary lymphadenopathy. Lungs/Pleura: No focal airspace opacities.  No pleural effusion. Upper abdomen: Limited visualization of the upper abdomen is unremarkable. Musculoskeletal: Regional osseous and soft tissue structures are unremarkable. Susceptibility artifact involves the medial aspect of the left chest, potentially at a location of a loop recorder device.  IMPRESSION: Stable uncomplicated fusiform aneurysmal dilatation of the ascending thoracic aorta measuring 51 mm in greatest diameter, unchanged since the 06/2015 examination. Electronically Signed   By: Sandi Mariscal M.D.   On: 06/24/2016 16:34    CLINICAL DATA:  ?Bicuspid aortic valve, dilated ascending aorta, ischemic cardiomyopathy  EXAM: CARDIAC MRI  TECHNIQUE: The patient was scanned on a 1.5 Tesla GE magnet. A dedicated cardiac coil was used. Functional imaging was done using Fiesta sequences. 2,3, and 4 chamber views were done to assess for RWMA's. Modified Simpson's rule using a short axis stack was used to calculate an ejection fraction on a dedicated work Conservation officer, nature. The patient received 35 cc of Multihance. MR angiography was done. After 10 minutes inversion recovery sequences were used to assess for infiltration and scar tissue.  CONTRAST:  35 cc Multihance  FINDINGS: Limited  images of the lung fields showed no significant abnormalities.  Mildly dilated left ventricle with moderate LV hypertrophy. The inferior, inferolateral, and anterolateral walls are hypokinetic. The calculated LV ejection fraction is 39%. Normal right ventricular size and systolic function. The right and left atrial sizes are normal. Probably mild mitral regurgitation. The aortic valve is bicuspid. There is mild aortic insufficiency. Visually, there appears to be at least moderate aortic stenosis.  On delayed enhancement imaging, there was 26-50% wall thickness subendocardial late gadolinium enhancement (LGE) in a small area of the mid inferior wall. There is < 25% wall thickness subendocardial LGE in a small area of the mid inferolateral wall.  On MR angiography, there was dilation of the aortic root (4.1 cm) and ascending aorta (4.9 cm). There was a bovine arch. Pulmonary veins drained normally to the left atrium.  MEASUREMENTS: MEASUREMENTS LV EDV 274 mL  LV SV 108 mL  LV EF 39%  Aortic root 4.1 cm  Ascending aorta 4.9 cm in greatest dimension  Aortic arch 2.8 cm  Descending thoracic aorta 2.7 cm  IMPRESSION: 1. Mildly dillated LV with moderate LV hypertrophy. EF 39% with wall motion abnormalities as above.  2. Bicuspid aortic valve with associated moderate ascending aorta dilatation to maximal 4.9 cm. The aortic valve is at least moderately stenotic visually, suggest correlation by echo.  3. Small areas of LGE in a coronary disease pattern in the inferior and inferolateral walls as described above.  Dalton Mclean   Electronically Signed   By: Loralie Champagne M.D.   On: 07/10/2015 17:48  I have independently reviewed the above radiology studies  and reviewed the findings with the patient.   cardiac cath with stents : 03/10/2015 Procedures   Coronary Stent Intervention  Conclusion   1. Prox RCA to Mid RCA lesion, diffuse 50% & focal 80%  stenosed. A drug-eluting stent was placed. There is a 0% residual stenosis post intervention. 2. Dist RCA lesion, 50% stenosed. 3. Prox Cx lesion, 80% stenosed. A drug-eluting stent was placed. There is a 0% residual stenosis post intervention. 4. Ost 2nd Mrg to 2nd Mrg lesion & Ost 3rd Mrg to 3rd Mrg lesion, 100% stenosed. CTO with collateral filling 5. Ramus lesion, 60% stenosed. Mid LAD lesion, 50% stenosed.    Successful 2 vessel PCI involving a very complex/difficult intervention on extensive mid RCA lesion and proximal circumflex with a 90 takeoff of the circumflex.   Plan:  Standard post radial PCI care with TR band removal.  Brachial sheath can be removed 2 hours following discontinuation of Angiomax  Continue dual antiplatelet therapy for minimum one year  Continue  aggressive cardiac risk factor modification and CHF treatment.  The patient does have moderate existing ramus intermedius disease which could potentially be a culprit for angina as well as the occluded OM 2 and OM 3 vessels.   ? The ramus is a potential PCI target, however the OM branches are likely not PCI target.  Patient disposition will be based on recommendation of the primary team/heart failure team. He likely requires additional CHF management prior to discharge.   Leonie Contreras, M.D., M.S. Interventional Cardiologist    ECHO:  Result status: Final result                              *[facility].                        [place], [place] [address]                            [phone]  ------------------------------------------------------------------- Echocardiography  Patient:    Durell, Lofaso MR #:       [id number] Study Date: 10/30/2015 Gender:     M Age:        35 Height:     180.3 cm Weight:     97.7 kg BSA:        2.24 m^2 Pt. Status: Room:   ATTENDING    Loralie Champagne, M.D.   ORDERING     Loralie Champagne, M.D.  REFERRING    Loralie Champagne, M.D.  SONOGRAPHER  Tresa Res, RDCS  PERFORMING   [facility], Outpatient  cc:  ------------------------------------------------------------------- LV EF: 40% -   45%  ------------------------------------------------------------------- Indications:      CHF - 428.0.  ------------------------------------------------------------------- History:   PMH:   Aortic valve disease.  Risk factors:  Former tobacco use.  ------------------------------------------------------------------- Study Conclusions  - Left ventricle: The cavity size was mildly dilated. There was   mild focal basal hypertrophy of the septum. Systolic function was   mildly to moderately reduced. The estimated ejection fraction was   in the range of 40% to 45%. There is hypokinesis of the   inferolateral myocardium. Doppler parameters are consistent with   abnormal left ventricular relaxation (grade 1 diastolic   dysfunction). - Aortic valve: Possibly bicuspid; severely thickened, severely   calcified leaflets. Valve mobility was restricted. There was   moderate stenosis. There was moderate regurgitation directed   centrally in the LVOT, eccentrically in the LVOT, and towards the   mitral anterior leaflet. Peak velocity (S): 329 cm/s. Mean   gradient (S): 33 mm Hg. Valve area (VTI): 1.42 cm^2. Valve area   (Vmax): 1.22 cm^2. Valve area (Vmean): 1.13 cm^2. - Aorta: Aortic root dimension: 45 mm (ED). - Ascending aorta: The ascending aorta was mildly dilated. - Mitral valve: Calcified annulus. Mildly thickened, mildly   calcified leaflets . There was mild regurgitation. - Left atrium: The atrium was moderately dilated. - Pulmonary arteries: Systolic pressure was mildly increased.  Impressions:  - EF is mildly improved when compared to prior (35%) Prior ECHO   measured aortic root-78mm. Current 66mm. Consider CT or MRI  to   obtain more precise  measurement.  Echocardiography.  M-mode, complete 2D, spectral Doppler, and color Doppler.  Birthdate:  Patient birthdate: 02/16/1956.  Age:  Patient is 61 yr old.  Sex:  Gender: male.    BMI: 30.1 kg/m^2.  Blood pressure:     128/68  Patient status:  Outpatient.  Study date: Study date: 10/30/2015. Study time: 09:51 AM.  Location:  Echo laboratory.  -------------------------------------------------------------------  ------------------------------------------------------------------- Left ventricle:  The cavity size was mildly dilated. There was mild focal basal hypertrophy of the septum. Systolic function was mildly to moderately reduced. The estimated ejection fraction was in the range of 40% to 45%.  Regional wall motion abnormalities:   There is hypokinesis of the inferolateral myocardium. Doppler parameters are consistent with abnormal left ventricular relaxation (grade 1 diastolic dysfunction).  ------------------------------------------------------------------- Aortic valve:   Possibly bicuspid; severely thickened, severely calcified leaflets. Valve mobility was restricted.  Doppler: There was moderate stenosis.   There was moderate regurgitation directed centrally in the LVOT, eccentrically in the LVOT, and towards the mitral anterior leaflet.    VTI ratio of LVOT to aortic valve: 0.29. Valve area (VTI): 1.42 cm^2. Indexed valve area (VTI): 0.64 cm^2/m^2. Peak velocity ratio of LVOT to aortic valve: 0.25. Valve area (Vmax): 1.22 cm^2. Indexed valve area (Vmax): 0.55 cm^2/m^2. Mean velocity ratio of LVOT to aortic valve: 0.23. Valve area (Vmean): 1.13 cm^2. Indexed valve area (Vmean): 0.51 cm^2/m^2.    Mean gradient (S): 33 mm Hg. Peak gradient (S): 43 mm Hg.  ------------------------------------------------------------------- Aorta:  Ascending aorta: The ascending aorta was mildly dilated.  ------------------------------------------------------------------- Mitral  valve:   Calcified annulus. Mildly thickened, mildly calcified leaflets . Mobility was not restricted.  Doppler: Transvalvular velocity was within the normal range. There was no evidence for stenosis. There was mild regurgitation.    Peak gradient (D): 2 mm Hg.  ------------------------------------------------------------------- Left atrium:  The atrium was moderately dilated.  ------------------------------------------------------------------- Right ventricle:  The cavity size was normal. Wall thickness was normal. Systolic function was normal.  ------------------------------------------------------------------- Pulmonic valve:   Poorly visualized.  Structurally normal valve. Cusp separation was normal.  Doppler:  Transvalvular velocity was within the normal range. There was no evidence for stenosis. There was no regurgitation.  ------------------------------------------------------------------- Tricuspid valve:   Structurally normal valve.    Doppler: Transvalvular velocity was within the normal range. There was trivial regurgitation.  ------------------------------------------------------------------- Pulmonary artery:   The main pulmonary artery was normal-sized. Systolic pressure was mildly increased.  ------------------------------------------------------------------- Right atrium:  The atrium was normal in size.  ------------------------------------------------------------------- Pericardium:  A prominent pericardial fat pad was present. There was no pericardial effusion.  ------------------------------------------------------------------- Systemic veins: Inferior vena cava: The vessel was normal in size.  ------------------------------------------------------------------- Measurements   Left ventricle                            Value          Reference  LV ID, ED, PLAX chordal            (H)    63.9  mm       43 - 52  LV ID, ES, PLAX chordal             (H)    51.3  mm       23 - 38  LV fx shortening, PLAX chordal     (L)    20    %        >=  29  LV PW thickness, ED                       11.3  mm       ---------  IVS/LV PW ratio, ED                       1.09           <=1.3  Stroke volume, 2D                         109   ml       ---------  Stroke volume/bsa, 2D                     49    ml/m^2   ---------  LV ejection fraction, 1-p A4C             56    %        ---------  LV end-diastolic volume, 2-p              190   ml       ---------  LV end-systolic volume, 2-p               105   ml       ---------  LV ejection fraction, 2-p                 45    %        ---------  Stroke volume, 2-p                        85    ml       ---------  LV end-diastolic volume/bsa, 2-p          85    ml/m^2   ---------  LV end-systolic volume/bsa, 2-p           47    ml/m^2   ---------  Stroke volume/bsa, 2-p                    38    ml/m^2   ---------  LV e&', lateral                            4.9   cm/s     ---------  LV E/e&', lateral                          15.45          ---------  LV e&', medial                             5.98  cm/s     ---------  LV E/e&', medial                           12.66          ---------  LV e&', average                            5.44  cm/s     ---------  LV E/e&', average  13.92          ---------  Longitudinal strain, TDI                  14    %        ---------    Ventricular septum                        Value          Reference  IVS thickness, ED                         12.3  mm       ---------    LVOT                                      Value          Reference  LVOT ID, S                                25    mm       ---------  LVOT area                                 4.91  cm^2     ---------  LVOT peak velocity, S                     82    cm/s     ---------  LVOT mean velocity, S                     56    cm/s     ---------  LVOT VTI, S                               22.2  cm        ---------    Aortic valve                              Value          Reference  Aortic valve peak velocity, S             329   cm/s     ---------  Aortic valve mean velocity, S             243   cm/s     ---------  Aortic valve VTI, S                       76.8  cm       ---------  Aortic mean gradient, S                   33    mm Hg    ---------  Aortic peak gradient, S                   43    mm Hg    ---------  VTI ratio, LVOT/AV  0.29           ---------  Aortic valve area, VTI                    1.42  cm^2     ---------  Aortic valve area/bsa, VTI                0.64  cm^2/m^2 ---------  Velocity ratio, peak, LVOT/AV             0.25           ---------  Aortic valve area, peak velocity          1.22  cm^2     ---------  Aortic valve area/bsa, peak               0.55  cm^2/m^2 ---------  velocity  Velocity ratio, mean, LVOT/AV             0.23           ---------  Aortic valve area, mean velocity          1.13  cm^2     ---------  Aortic valve area/bsa, mean               0.51  cm^2/m^2 ---------  velocity    Aorta                                     Value          Reference  Aortic root ID, ED                        45    mm       ---------    Left atrium                               Value          Reference  LA ID, A-P, ES                            49    mm       ---------  LA ID/bsa, A-P                            2.19  cm/m^2   <=2.2  LA volume, ES, 1-p A4C                    47.7  ml       ---------  LA volume/bsa, ES, 1-p A4C                21.3  ml/m^2   ---------  LA volume, ES, 1-p A2C                    100   ml       ---------  LA volume/bsa, ES, 1-p A2C                44.7  ml/m^2   ---------    Mitral valve                              Value  Reference  Mitral E-wave peak velocity               75.7  cm/s     ---------  Mitral A-wave peak velocity               77.1  cm/s     ---------  Mitral deceleration time           (H)     408   ms       150 - 230  Mitral peak gradient, D                   2     mm Hg    ---------  Mitral E/A ratio, peak                    1              ---------    Pulmonary arteries                        Value          Reference  PA pressure, S, DP                        30    mm Hg    <=30    Tricuspid valve                           Value          Reference  Tricuspid regurg peak velocity            259   cm/s     ---------  Tricuspid peak RV-RA gradient             27    mm Hg    ---------    Systemic veins                            Value          Reference  Estimated CVP                             3     mm Hg    ---------    Right ventricle                           Value          Reference  RV pressure, S, DP                        30    mm Hg    <=30  RV s&', lateral, S                         18.1  cm/s     ---------  Legend: (L)  and  (H)  mark values outside specified reference range.  ------------------------------------------------------------------- Prepared and Electronically Authenticated by  Candee Furbish, M.D. 2017-04-21T10:59:44     Recent Lab Findings: Lab Results  Component Value Date   WBC 8.9 03/11/2015   HGB 14.4 03/11/2015   HCT 42.6 03/11/2015   PLT 230 03/11/2015   GLUCOSE 90 10/21/2016   CHOL  234 (H) 10/21/2016   TRIG 323 (H) 10/21/2016   HDL 43 10/21/2016   LDLCALC 126 (H) 10/21/2016   ALT 17 09/18/2015   AST 21 09/18/2015   NA 138 10/21/2016   K 4.2 10/21/2016   CL 103 10/21/2016   CREATININE 0.91 10/21/2016   BUN 17 10/21/2016   CO2 24 10/21/2016   TSH 0.941 03/09/2015   INR 1.07 03/10/2015   HGBA1C 5.6 03/09/2015   Aortic Size Index=    5.0     /Body surface area is 2.2 meters squared. = 2.27  < 2.75 cm/m2      4% risk per year 2.75 to 4.25          8% risk per year > 4.25 cm/m2    20% risk per year  The [employer] [facility]) and the [place]) have issued a statement to clarify 2  previous guidelines from the [facility], [place], and collaborating societies addressing the risk of aortic dissection in patients with bicuspid aortic valves (BAV) and severe aortic enlargement. The 2 guidelines differ with regard to the recommended threshold of aortic root or ascending aortic dilatation that would justify surgical intervention in patients with bicuspid aortic valves. This new statement of clarification uses the ACC/AHA revised structure for delineating the Class of Recommendation and Level of Evidence to provide recommendation that replace those contained in Section 9.2.2.1 of the thoracic aortic disease guidelines and Section 5.1.3 of the valvular heart disease guideline. New recommendations in intervention in patients with BAV and dilatation of the aortic root (sinuses) or ascending aorta include:  . Operative intervention to repair or replace the aortic root (sinuses) or replace the ascending aorta is indicated in asymptomatic patients with BAV if the diameter of the aortic root or ascending aorta is 5.5 cm or greater. (Class of recommendation 1, Level of evidence B-NR).  Marland Kitchen Operative intervention to repair or replace the aortic root (sinuses) or replace the ascending aorta is reasonable in asymptomatic patients with BAV if the diameter of the aortic root or ascending aorta is 5.0 cm or greater and an additional risk factor for dissection is present or if the patient is at low surgical risk and the surgery is performed by an experienced aortic surgical team in a center with established expertise in these procedures. (Class of recommendation IIa; Level of Evidence B-NR).  .   Citation: Donnamarie Poag, Randolm Idol, et al. Surgery for aortic dilatation in patients with bicuspid aortic valves. A statement of clarification from the [employer]/American Heart Association Task Force on Clinical Practice Guidelines. [Published online ahead of print June 13, 2014].  Circulation. doi: 10.1161/CIR.0000000000000331.   Assessment / Plan:    1Possibly bicuspid Aortic ; severely thickened, severely calcified leaflets. There is aortic moderate stenosis.   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  Now with increasing symptoms of AS I have recommended proceeding with elective cardiac cath by Dr  Einar Crow and AVR and replacement of the ascending aorta in the near future .  We also discussed surgical replacement of AVR and ascending aorta,  including alternatives of mechanical vs tissue valves   Plan to see the patient back in after cardiac catheterization and finalize plans for definitive repair of this aortic valve and ascending aorta,   Grace Isaac MD      [address] [place] [address] Office [phone]   Beeper [phone]  01/27/2017 5:16 PM

## 2017-01-27 NOTE — Progress Notes (Signed)
Please call William Contreras and arrange for left and right heart catheterization with me prior to his AVR and ascending aorta replacement with Dr. Servando Snare. This can be in the next week or so if he is agreeable.  I think he should be expecting this call.  OK to have him come in to see me to discuss if he wants but can go ahead and schedule without appointment if he is agreeable.

## 2017-01-30 ENCOUNTER — Telehealth (HOSPITAL_COMMUNITY): Payer: Self-pay | Admitting: *Deleted

## 2017-01-30 NOTE — Telephone Encounter (Signed)
Larey Dresser, MD Author Type: Physician Filed: 01/27/2017 10:54 PM  Note Status: Signed Cosign: Cosign Not Required Encounter Date: 01/26/2017  Editor: Larey Dresser, MD (Physician)    Please call Mr Berggren and arrange for left and right heart catheterization with me prior to his AVR and ascending aorta replacement with Dr. Servando Snare. This can be in the next week or so if he is agreeable.  I think he should be expecting this call.  OK to have him come in to see me to discuss if he wants but can go ahead and schedule without appointment if he is agreeable.      Attempted to call pt to schedule and Left message to call back

## 2017-01-30 NOTE — Telephone Encounter (Signed)
-----   Message from Larey Dresser, MD sent at 01/27/2017 10:54 PM EDT -----   ----- Message ----- From: Grace Isaac, MD Sent: 01/27/2017   5:37 PM To: Larey Dresser, MD

## 2017-02-01 NOTE — Telephone Encounter (Signed)
Left message to call back  

## 2017-02-05 ENCOUNTER — Other Ambulatory Visit (HOSPITAL_COMMUNITY): Payer: Self-pay | Admitting: Cardiology

## 2017-02-06 ENCOUNTER — Telehealth (HOSPITAL_COMMUNITY): Payer: Self-pay

## 2017-02-06 ENCOUNTER — Other Ambulatory Visit (HOSPITAL_COMMUNITY): Payer: Self-pay

## 2017-02-06 DIAGNOSIS — R0600 Dyspnea, unspecified: Secondary | ICD-10-CM

## 2017-02-06 NOTE — Telephone Encounter (Signed)
Scheduled right and left heart cath with Dr. Aundra Dubin for 8/10 at 0900. Instructions reviewed with patient over the phone, letter mailed with written instructions as well. Patient aware and agreeable.  Renee Pain, RN

## 2017-02-09 ENCOUNTER — Encounter (HOSPITAL_COMMUNITY): Payer: Self-pay

## 2017-02-09 ENCOUNTER — Telehealth (HOSPITAL_COMMUNITY): Payer: Self-pay

## 2017-02-09 NOTE — Telephone Encounter (Signed)
Rescheduled patient's L&R HC with Dr. Aundra Dubin to 8/8 at 12 noon. Patient aware and agreeable and instructions reviewed. Also emailed letter of instructions to [email or web address] per patient request.  Renee Pain, RN

## 2017-02-15 ENCOUNTER — Encounter (HOSPITAL_COMMUNITY)
Admission: RE | Disposition: A | Discharge status: Home / Self Care | Payer: Self-pay | Source: Ambulatory Visit | Attending: Cardiology

## 2017-02-15 ENCOUNTER — Ambulatory Visit (HOSPITAL_COMMUNITY)
Admission: RE | Admit: 2017-02-15 | Discharge: 2017-02-15 | Disposition: A | Discharge status: Home / Self Care | Payer: BLUE CROSS/BLUE SHIELD | Source: Ambulatory Visit | Attending: Cardiology | Admitting: Cardiology

## 2017-02-15 DIAGNOSIS — I2510 Atherosclerotic heart disease of native coronary artery without angina pectoris: Secondary | ICD-10-CM | POA: Diagnosis not present

## 2017-02-15 DIAGNOSIS — Z87891 Personal history of nicotine dependence: Secondary | ICD-10-CM | POA: Insufficient documentation

## 2017-02-15 DIAGNOSIS — I255 Ischemic cardiomyopathy: Secondary | ICD-10-CM | POA: Insufficient documentation

## 2017-02-15 DIAGNOSIS — I712 Thoracic aortic aneurysm, without rupture: Secondary | ICD-10-CM | POA: Insufficient documentation

## 2017-02-15 DIAGNOSIS — R0600 Dyspnea, unspecified: Secondary | ICD-10-CM

## 2017-02-15 DIAGNOSIS — Q231 Congenital insufficiency of aortic valve: Secondary | ICD-10-CM | POA: Diagnosis not present

## 2017-02-15 DIAGNOSIS — Z6829 Body mass index (BMI) 29.0-29.9, adult: Secondary | ICD-10-CM | POA: Insufficient documentation

## 2017-02-15 DIAGNOSIS — Z955 Presence of coronary angioplasty implant and graft: Secondary | ICD-10-CM | POA: Diagnosis not present

## 2017-02-15 DIAGNOSIS — Z7982 Long term (current) use of aspirin: Secondary | ICD-10-CM | POA: Insufficient documentation

## 2017-02-15 DIAGNOSIS — I509 Heart failure, unspecified: Secondary | ICD-10-CM | POA: Diagnosis not present

## 2017-02-15 DIAGNOSIS — Z8249 Family history of ischemic heart disease and other diseases of the circulatory system: Secondary | ICD-10-CM | POA: Insufficient documentation

## 2017-02-15 DIAGNOSIS — Z7951 Long term (current) use of inhaled steroids: Secondary | ICD-10-CM | POA: Insufficient documentation

## 2017-02-15 DIAGNOSIS — I700 Atherosclerosis of aorta: Secondary | ICD-10-CM | POA: Diagnosis not present

## 2017-02-15 DIAGNOSIS — I10 Essential (primary) hypertension: Secondary | ICD-10-CM | POA: Diagnosis not present

## 2017-02-15 DIAGNOSIS — Z880 Allergy status to penicillin: Secondary | ICD-10-CM | POA: Diagnosis not present

## 2017-02-15 LAB — POCT I-STAT 3, VENOUS BLOOD GAS (G3P V)
ACID-BASE EXCESS: 1 mmol/L (ref 0.0–2.0)
Acid-Base Excess: 1 mmol/L (ref 0.0–2.0)
BICARBONATE: 26.1 mmol/L (ref 20.0–28.0)
Bicarbonate: 25.8 mmol/L (ref 20.0–28.0)
O2 SAT: 68 %
O2 Saturation: 69 %
PH VEN: 7.407 (ref 7.250–7.430)
TCO2: 27 mmol/L (ref 0–100)
TCO2: 27 mmol/L (ref 0–100)
pCO2, Ven: 41.5 mmHg — ABNORMAL LOW (ref 44.0–60.0)
pCO2, Ven: 41.5 mmHg — ABNORMAL LOW (ref 44.0–60.0)
pH, Ven: 7.401 (ref 7.250–7.430)
pO2, Ven: 35 mmHg (ref 32.0–45.0)
pO2, Ven: 36 mmHg (ref 32.0–45.0)

## 2017-02-15 LAB — BASIC METABOLIC PANEL
Anion gap: 5 (ref 5–15)
BUN: 17 mg/dL (ref 6–20)
CHLORIDE: 105 mmol/L (ref 101–111)
CO2: 27 mmol/L (ref 22–32)
CREATININE: 0.99 mg/dL (ref 0.61–1.24)
Calcium: 9.4 mg/dL (ref 8.9–10.3)
GFR calc Af Amer: >60 mL/min (ref >60)
GFR calc non Af Amer: >60 mL/min (ref >60)
GLUCOSE: 95 mg/dL (ref 65–99)
POTASSIUM: 4.3 mmol/L (ref 3.5–5.1)
Sodium: 137 mmol/L (ref 135–145)

## 2017-02-15 LAB — PROTIME-INR
INR: 1.04
Prothrombin Time: 13.6 seconds (ref 11.4–15.2)

## 2017-02-15 LAB — CBC
HEMATOCRIT: 41 % (ref 39.0–52.0)
Hemoglobin: 14.2 g/dL (ref 13.0–17.0)
MCH: 31.6 pg (ref 26.0–34.0)
MCHC: 34.6 g/dL (ref 30.0–36.0)
MCV: 91.3 fL (ref 78.0–100.0)
Platelets: 198 10*3/uL (ref 150–400)
RBC: 4.49 MIL/uL (ref 4.22–5.81)
RDW: 11.9 % (ref 11.5–15.5)
WBC: 5.5 10*3/uL (ref 4.0–10.5)

## 2017-02-15 SURGERY — RIGHT HEART CATH AND CORONARY ANGIOGRAPHY
Anesthesia: LOCAL

## 2017-02-15 MED ORDER — SODIUM CHLORIDE 0.9% FLUSH: 3.0000 mL | Freq: Two times a day (BID) | INTRAVENOUS | Status: DC

## 2017-02-15 MED ORDER — VERAPAMIL HCL 2.5 MG/ML IV SOLN
INTRAVENOUS | Status: DC | PRN
  Administered 2017-02-15: 10 mL via INTRA_ARTERIAL

## 2017-02-15 MED ORDER — SODIUM CHLORIDE 0.9% FLUSH: 3.0000 mL | INTRAVENOUS | Status: DC | PRN

## 2017-02-15 MED ORDER — FENTANYL CITRATE (PF) 100 MCG/2ML IJ SOLN
INTRAMUSCULAR | Status: DC | PRN
  Administered 2017-02-15 (×2): 25 ug via INTRAVENOUS

## 2017-02-15 MED ORDER — SODIUM CHLORIDE 0.9 % IV SOLN: 250.0000 mL | INTRAVENOUS | Status: DC | PRN

## 2017-02-15 MED ORDER — LIDOCAINE HCL (PF) 1 % IJ SOLN
INTRAMUSCULAR | Status: AC
  Filled 2017-02-15: qty 30

## 2017-02-15 MED ORDER — MIDAZOLAM HCL 2 MG/2ML IJ SOLN
INTRAMUSCULAR | Status: AC
  Filled 2017-02-15: qty 2

## 2017-02-15 MED ORDER — ONDANSETRON HCL 4 MG/2ML IJ SOLN: 4.0000 mg | Freq: Four times a day (QID) | INTRAMUSCULAR | Status: DC | PRN

## 2017-02-15 MED ORDER — LIDOCAINE HCL (PF) 1 % IJ SOLN
INTRAMUSCULAR | Status: DC | PRN
  Administered 2017-02-15 (×2): 2 mL via SUBCUTANEOUS

## 2017-02-15 MED ORDER — ACETAMINOPHEN 325 MG PO TABS: 650.0000 mg | ORAL_TABLET | ORAL | Status: DC | PRN

## 2017-02-15 MED ORDER — SODIUM CHLORIDE 0.9 % WEIGHT BASED INFUSION: 1.0000 mL/kg/h | INTRAVENOUS | Status: DC

## 2017-02-15 MED ORDER — MIDAZOLAM HCL 2 MG/2ML IJ SOLN
INTRAMUSCULAR | Status: DC | PRN
  Administered 2017-02-15 (×2): 1 mg via INTRAVENOUS

## 2017-02-15 MED ORDER — HEPARIN SODIUM (PORCINE) 1000 UNIT/ML IJ SOLN
INTRAMUSCULAR | Status: AC
  Filled 2017-02-15: qty 1

## 2017-02-15 MED ORDER — IOPAMIDOL (ISOVUE-370) INJECTION 76%
INTRAVENOUS | Status: AC
  Filled 2017-02-15: qty 100

## 2017-02-15 MED ORDER — VERAPAMIL HCL 2.5 MG/ML IV SOLN
INTRAVENOUS | Status: AC
  Filled 2017-02-15: qty 2

## 2017-02-15 MED ORDER — IOPAMIDOL (ISOVUE-370) INJECTION 76%
INTRAVENOUS | Status: DC | PRN
  Administered 2017-02-15: 90 mL via INTRA_ARTERIAL

## 2017-02-15 MED ORDER — HEPARIN (PORCINE) IN NACL 2-0.9 UNIT/ML-% IJ SOLN
INTRAMUSCULAR | Status: AC | PRN
  Administered 2017-02-15: 1000 mL

## 2017-02-15 MED ORDER — HEPARIN SODIUM (PORCINE) 1000 UNIT/ML IJ SOLN
INTRAMUSCULAR | Status: DC | PRN
  Administered 2017-02-15: 5000 [IU] via INTRAVENOUS

## 2017-02-15 MED ORDER — FENTANYL CITRATE (PF) 100 MCG/2ML IJ SOLN
INTRAMUSCULAR | Status: AC
  Filled 2017-02-15: qty 2

## 2017-02-15 MED ORDER — HEPARIN (PORCINE) IN NACL 2-0.9 UNIT/ML-% IJ SOLN
INTRAMUSCULAR | Status: AC
  Filled 2017-02-15: qty 1000

## 2017-02-15 MED ORDER — SODIUM CHLORIDE 0.9 % IV SOLN
INTRAVENOUS | Status: DC
  Administered 2017-02-15: 11:00:00 via INTRAVENOUS

## 2017-02-15 SURGICAL SUPPLY — 13 items
CATH BALLN WEDGE 5F 110CM (CATHETERS) ×2 IMPLANT
CATH EXPO 5FR FR4 (CATHETERS) ×2 IMPLANT
CATH INFINITI 5 FR JL3.5 (CATHETERS) ×2 IMPLANT
CATH INFINITI 5FR JL4 (CATHETERS) ×2 IMPLANT
DEVICE RAD COMP TR BAND LRG (VASCULAR PRODUCTS) ×2 IMPLANT
GLIDESHEATH SLEND SS 6F .021 (SHEATH) ×2 IMPLANT
GUIDEWIRE INQWIRE 1.5J.035X260 (WIRE) ×1 IMPLANT
INQWIRE 1.5J .035X260CM (WIRE) ×2
KIT HEART LEFT (KITS) ×2 IMPLANT
PACK CARDIAC CATHETERIZATION (CUSTOM PROCEDURE TRAY) ×2 IMPLANT
SHEATH GLIDE SLENDER 4/5FR (SHEATH) ×2 IMPLANT
TRANSDUCER W/STOPCOCK (MISCELLANEOUS) ×2 IMPLANT
TUBING CIL FLEX 10 FLL-RA (TUBING) ×2 IMPLANT

## 2017-02-15 NOTE — Interval H&P Note (Signed)
History and Physical Interval Note:  02/15/2017 12:43 PM  William Contreras  has presented today for surgery, with the diagnosis of hf  The various methods of treatment have been discussed with the patient and family. After consideration of risks, benefits and other options for treatment, the patient has consented to  Procedure(s): Right/Left Heart Cath and Coronary Angiography (N/A) as a surgical intervention .  The patient's history has been reviewed, patient examined, no change in status, stable for surgery.  I have reviewed the patient's chart and labs.  Questions were answered to the patient's satisfaction.     Rexann Lueras [employer]

## 2017-02-15 NOTE — Progress Notes (Signed)
Epic down @1430  see paper chart.

## 2017-02-15 NOTE — H&P (View-Only) (Signed)
[facility] [address]       Contreras, William Contreras             [phone]                    William Contreras  Medical Record #[id number] Date of Birth: 1956-06-02  Referring: Larey Dresser, MD Primary Care: Angelina Sheriff, MD  Chief Complaint:    Chief Complaint  Patient presents with  . TAA    6 month f/u with CTA CHEST    History of Present Illness:    William Contreras 61 y.o. male is seen in the office  today after Repeat CTA of the chest . I first saw him in January 2018 after a MRI of the chest. Since I last saw him he notes increasing symptoms of shortness of breath with exertion and chest discomfort. He has had no syncope or presyncope.   He presented to cardiology in 8/16 with acute systolic CHF .  Troponin was mildly elevated to 0.04.  Echo showed EF 30-35%. Cardiac Cath showed diffuse severe disease but the LAD disease was relatively mild .  He had PCI with DES to mLCx and proximal to mid RCA.  Cardiac MRI/MRA chest showed EF 39%, bicuspid aortic valve with probable moderate aortic stenosis, 4.9 cm ascending aorta.  Most recent echo in 4/17 with EF 40-45%, moderate AS/moderate AI, 4.5 cm aortic root.     He has no family history of bicuspid valve, dilated aorta , aortic dissection or sudden unexplaind death of 1st degree relatives. His father died in his 38's in MVA, mother is alive at 23. Has brother and sister without known cardiac disease.     Current Activity/ Functional Status:  Patient is independent with mobility/ambulation, transfers, ADL's, IADL's.   Zubrod Score: At the time of surgery this patient's most appropriate activity status/level should be described as: [x]     0    Normal activity, no symptoms []     1    Restricted in physical strenuous activity but ambulatory, able to do out light work []     2    Ambulatory and capable of self care, unable to do work activities, up and about               >50 % of waking hours                               []     3    Only limited self care, in bed greater than 50% of waking hours []     4    Completely disabled, no self care, confined to bed or chair []     5    Moribund   Past Medical History:  Diagnosis Date  . Essential hypertension   . Morbid obesity ([place])     Past Surgical History:  Procedure Laterality Date  . CARDIAC CATHETERIZATION N/A 03/10/2015   Procedure: Right/Left Heart Cath and Coronary Angiography;  Surgeon: Larey Dresser, MD;  Location: [facility];  Service: Cardiovascular;  Laterality: N/A;  . CARDIAC CATHETERIZATION N/A 03/10/2015   Procedure: Coronary Stent Intervention;  Surgeon: Leonie Man, MD;  Location: [facility];  Service: Cardiovascular;  Laterality: N/A;  . TONSILLECTOMY      Family History  Problem Relation Age of Onset  . Other Father  died young in [place].  Marland Kitchen Heart attack Mother        alive @ 40.  . Other Brother        A & W  . Other Sister        A & W    Social History   Social History  . Marital status: Married    Spouse name: N/A  . Number of children: N/A  . Years of education: N/A   Occupational History  . Not on file.   Social History Main Topics  . Smoking status: Former Smoker    Packs/day: 1.00    Years: 15.00    Types: Cigarettes  . Smokeless tobacco: Never Used     Comment: quit smoking ~ 20 yrs ago.  . Alcohol use No     Comment: prev drank - none in ~ 25 yrs.  . Drug use: No     Comment: prev smoked MJ, none in 25 yrs.  . Sexual activity: Yes   Other Topics Concern  . Not on file   Social History Narrative   Pt lives in [place] with his wife and son.  He has 2 step children and another child - all grown and out of the house.    History  Smoking Status  . Former Smoker  . Packs/day: 1.00  . Years: 15.00  . Types: Cigarettes  Smokeless Tobacco  . Never Used    Comment: quit smoking ~ 20 yrs ago.    History  Alcohol Use No    Comment: prev drank - none  in ~ 25 yrs.     Allergies  Allergen Reactions  . Lipitor [Atorvastatin] Other (See Comments)    Joint and head pains  . Penicillins Other (See Comments)    Lightheadness    Current Outpatient Prescriptions  Medication Sig Dispense Refill  . aspirin EC 81 MG tablet Take 81 mg by mouth daily.    . calcium citrate-vitamin D (CITRACAL+D) 315-200 MG-UNIT tablet Take 1 tablet by mouth daily.    . carvedilol (COREG) 6.25 MG tablet TAKE ONE TABLET BY MOUTH TWICE DAILY 60 tablet 11  . Coenzyme Q10 (COQ10) 200 MG CAPS Take 200 mg by mouth daily. 30 capsule 2  . Flaxseed, Linseed, (FLAXSEED OIL) 1000 MG CAPS Take 1,000 mg by mouth daily.    . fluticasone (FLONASE) 50 MCG/ACT nasal spray Place 2 sprays into both nostrils daily as needed for allergies or rhinitis.    . Iron-Vitamins (GERITOL COMPLETE) TABS Take 1 tablet by mouth daily.    . Lactobacillus (ULTIMATE PROBIOTIC FORMULA) CAPS Take 1 capsule by mouth daily.    Marland Kitchen lisinopril (PRINIVIL,ZESTRIL) 2.5 MG tablet TAKE ONE TABLET BY MOUTH TWICE DAILY (Patient taking differently: TAKE ONE TABLET BY MOUTH ONCE DAILY) 60 tablet 3  . Magnesium 400 MG TABS Take 400 mg by mouth daily.    . Omega-3 Fatty Acids (FISH OIL) 1000 MG CAPS 2 grams po BID  0  . rosuvastatin (CRESTOR) 10 MG tablet TAKE ONE TABLET BY MOUTH ONCE DAILY 30 tablet 3  . spironolactone (ALDACTONE) 25 MG tablet TAKE 1/2 (ONE-HALF) TABLET BY MOUTH ONCE DAILY 15 tablet 3   No current facility-administered medications for this visit.       Review of Systems:     Cardiac Review of Systems: Y or N  Chest Pain [  n  ]  Resting SOB [ n  ] Exertional SOB n [  ]  Orthopnea [  n ]   Pedal Edema [ n  ]    Palpitations [ n ] Syncope  [n  ]   Presyncope [  n ]  General Review of Systems: [Y] = yes [  ]=no Constitional: recent weight change [ n ];  Wt loss over the last 3 months [   ] anorexia [  ]; fatigue [  ]; nausea [  ]; night sweats [  ]; fever [  ]; or chills [  ];          Dental:  poor dentition[  n]; Last Dentist visit: every 6 months sees dentist   Eye : blurred vision [  ]; diplopia [   ]; vision changes [  ];  Amaurosis fugax[  ]; Resp: cough [  ];  wheezing[  ];  hemoptysis[  ]; shortness of breath[  ]; paroxysmal nocturnal dyspnea[  ]; dyspnea on exertion[  ]; or orthopnea[  ];  GI:  gallstones[n  ], vomiting[  ];  dysphagia[  ]; melena[  ];  hematochezia [  ]; heartburn[  ];   Hx of  Colonoscopy[  ]; GU: kidney stones [  ]; hematuria[  ];   dysuria [  ];  nocturia[  ];  history of     obstruction [  ]; urinary frequency [  ]             Skin: rash, swelling[  ];, hair loss[  ];  peripheral edema[n  ];  or itching[  ]; Musculosketetal: myalgias[  ];  joint swelling[  ];  joint erythema[  ];  joint pain[  ];  back pain[  ];  Heme/Lymph: bruising[  ];  bleeding[  ];  anemia[  ];  Neuro: TIA[  ];  headaches[  ];  stroke[  ];  vertigo[  ];  seizures[  ];   paresthesias[  ];  difficulty walking[n  ];  Psych:depression[  ]; anxiety[  ];  Endocrine: diabetes[n  ];  thyroid dysfunction[n  ];  Immunizations: Flu up to date Florencio.Farrier  ]; Pneumococcal up to date [ n ];  Other:  Physical Exam: BP 110/74 (BP Location: Right Arm, Patient Position: Sitting, Cuff Size: Large)   Pulse (!) 49   Resp 16   Ht 5\' 11"  (1.803 m)   Wt 212 lb (96.2 kg)   SpO2 98% Comment: ON RA  BMI 29.57 kg/m   PHYSICAL EXAMINATION: General appearance: alert, cooperative and appears stated age in no distress Head: Normocephalic, without obvious abnormality, atraumatic, does not have any appearance of genetic aortopathy  Neck: no adenopathy, no carotid bruit, no JVD, supple, symmetrical, trachea midline and thyroid not enlarged, symmetric, no tenderness/mass/nodules Lymph nodes: Cervical supra Fick actually lymph nodes not enlarged Resp: Lungs are clear bilaterally Back: symmetric, no curvature. ROM normal. No CVA tenderness. Cardio: 3/6 holosystolic murmur along the right sternal border  GI: soft,  non-tender; bowel sounds normal; no masses,  no organomegaly Extremities: extremities normal, atraumatic, no cyanosis or edema, Bilaterial severe below knee varicosities no obvious varicosities and upper legs Neurologic: Grossly normal  Diagnostic Studies & Laboratory data:     Recent Radiology Findings:   Ct Angio Chest Aorta W &/or Wo Contrast  Result Date: 01/26/2017 CLINICAL DATA:  Pre-op for aneurysm repair and aortic valve replacement. Slight chest pain, SOB on exertion. Fatigue. Nonsmoker over 20 yrs. Heart stents x 2. No hx ca. EXAM: CT ANGIOGRAPHY CHEST WITH CONTRAST TECHNIQUE: Multidetector CT imaging of the chest  was performed using the standard protocol during bolus administration of intravenous contrast. Multiplanar CT image reconstructions and MIPs were obtained to evaluate the vascular anatomy. CONTRAST:  75 cc Isovue 370 COMPARISON:  MRA chest dated 06/24/2016. FINDINGS: Cardiovascular: Ascending thoracic aorta measures 4.9 cm diameter. Atherosclerosis at the aortic valve. Aortic arch and descending thoracic aorta are normal in caliber. Heart size is normal. No pericardial effusion. Coronary artery calcifications. Mediastinum/Nodes: No mass or enlarged lymph nodes within the mediastinum or perihilar regions. Esophagus appears normal. Trachea and central bronchi are unremarkable. Lungs/Pleura: Lungs are clear.  No pleural effusion or pneumothorax. Upper Abdomen: Limited images of the upper abdomen are unremarkable. Musculoskeletal: Degenerative changes throughout the slightly kyphotic thoracic spine, mild to moderate in degree, with evidence of DISH. No acute or suspicious osseous finding. Review of the MIP images confirms the above findings. IMPRESSION: 1. No acute findings. No aortic dissection. No pneumonia or pulmonary edema. No pericardial effusion. 2. 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 3. Aortic atherosclerosis. 4. Coronary artery calcifications, particularly dense within the left main and left circumflex coronary arteries. Recommend correlation with any possible associated cardiac symptoms. Aortic aneurysm NOS (ICD10-I71.9). Aortic Atherosclerosis (ICD10-I70.0). Electronically Signed   By: Franki Cabot M.D.   On: 01/26/2017 15:40   I have independently reviewed the above radiology studies  and reviewed the findings with the patient.    Mr Angiogram Chest W Wo Contrast  Result Date: 06/24/2016 CLINICAL DATA:  Evaluate thoracic aortic aneurysm. EXAM: MRA CHEST WITH CONTRAST TECHNIQUE: Angiographic images of the chest were obtained using MRA technique with intravenous contrast. CONTRAST:  19mL MULTIHANCE GADOBENATE DIMEGLUMINE 529 MG/ML IV SOLN COMPARISON:  Chest MRI - 07/09/2015 FINDINGS: Vascular Findings: Grossly unchanged fusiform aneurysmal dilatation of the ascending thoracic aorta was measurements as follows. The thoracic aorta tapers to a normal caliber at the level of the aortic arch. Bovine configuration of the aortic arch. The branch vessels of the aortic arch remain widely patent throughout their imaged course. Normal heart size.  No pericardial effusion. Although this examination was not tailored for the evaluation the pulmonary arteries, there are no discrete filling defects within the central pulmonary arterial tree to suggest central pulmonary embolism. Normal caliber of the main pulmonary artery. ------------------------------------------------------------- Thoracic aortic measurements: Sinotubular junction 43 mm as measured in greatest oblique sagittal dimension (image 42, series 1101). Proximal ascending aorta 51 mm as measured in greatest oblique sagittal dimension at the level of the main pulmonary artery (image 35, series 1101) and approximately 50 mm in greatest oblique axial diameter (image 47, series 12), unchanged since  the 06/2015 examination. Aortic arch aorta 30 mm as measured in greatest oblique sagittal dimension (image 39, series 1101). Proximal descending thoracic aorta 29 mm as measured in greatest oblique axial dimension at the level of the main pulmonary artery (image 47, series 12). Distal descending thoracic aorta 27 mm as measured in greatest oblique axial dimension at the level of the diaphragmatic hiatus (sagittal image 27, series 1101). Review of the MIP images confirms the above findings. ------------------------------------------------------------- Non-Vascular Findings: Mediastinum/Lymph Nodes: No bulky mediastinal, hilar axillary lymphadenopathy. Lungs/Pleura: No focal airspace opacities.  No pleural effusion. Upper abdomen: Limited visualization of the upper abdomen is unremarkable. Musculoskeletal: Regional osseous and soft tissue structures are unremarkable. Susceptibility artifact involves the medial aspect of the left chest, potentially at a location of a loop recorder device.  IMPRESSION: Stable uncomplicated fusiform aneurysmal dilatation of the ascending thoracic aorta measuring 51 mm in greatest diameter, unchanged since the 06/2015 examination. Electronically Signed   By: Sandi Mariscal M.D.   On: 06/24/2016 16:34    CLINICAL DATA:  ?Bicuspid aortic valve, dilated ascending aorta, ischemic cardiomyopathy  EXAM: CARDIAC MRI  TECHNIQUE: The patient was scanned on a 1.5 Tesla GE magnet. A dedicated cardiac coil was used. Functional imaging was done using Fiesta sequences. 2,3, and 4 chamber views were done to assess for RWMA's. Modified Simpson's rule using a short axis stack was used to calculate an ejection fraction on a dedicated work Conservation officer, nature. The patient received 35 cc of Multihance. MR angiography was done. After 10 minutes inversion recovery sequences were used to assess for infiltration and scar tissue.  CONTRAST:  35 cc Multihance  FINDINGS: Limited  images of the lung fields showed no significant abnormalities.  Mildly dilated left ventricle with moderate LV hypertrophy. The inferior, inferolateral, and anterolateral walls are hypokinetic. The calculated LV ejection fraction is 39%. Normal right ventricular size and systolic function. The right and left atrial sizes are normal. Probably mild mitral regurgitation. The aortic valve is bicuspid. There is mild aortic insufficiency. Visually, there appears to be at least moderate aortic stenosis.  On delayed enhancement imaging, there was 26-50% wall thickness subendocardial late gadolinium enhancement (LGE) in a small area of the mid inferior wall. There is < 25% wall thickness subendocardial LGE in a small area of the mid inferolateral wall.  On MR angiography, there was dilation of the aortic root (4.1 cm) and ascending aorta (4.9 cm). There was a bovine arch. Pulmonary veins drained normally to the left atrium.  MEASUREMENTS: MEASUREMENTS LV EDV 274 mL  LV SV 108 mL  LV EF 39%  Aortic root 4.1 cm  Ascending aorta 4.9 cm in greatest dimension  Aortic arch 2.8 cm  Descending thoracic aorta 2.7 cm  IMPRESSION: 1. Mildly dillated LV with moderate LV hypertrophy. EF 39% with wall motion abnormalities as above.  2. Bicuspid aortic valve with associated moderate ascending aorta dilatation to maximal 4.9 cm. The aortic valve is at least moderately stenotic visually, suggest correlation by echo.  3. Small areas of LGE in a coronary disease pattern in the inferior and inferolateral walls as described above.  Dalton Mclean   Electronically Signed   By: Loralie Champagne M.D.   On: 07/10/2015 17:48  I have independently reviewed the above radiology studies  and reviewed the findings with the patient.   cardiac cath with stents : 03/10/2015 Procedures   Coronary Stent Intervention  Conclusion   1. Prox RCA to Mid RCA lesion, diffuse 50% & focal 80%  stenosed. A drug-eluting stent was placed. There is a 0% residual stenosis post intervention. 2. Dist RCA lesion, 50% stenosed. 3. Prox Cx lesion, 80% stenosed. A drug-eluting stent was placed. There is a 0% residual stenosis post intervention. 4. Ost 2nd Mrg to 2nd Mrg lesion & Ost 3rd Mrg to 3rd Mrg lesion, 100% stenosed. CTO with collateral filling 5. Ramus lesion, 60% stenosed. Mid LAD lesion, 50% stenosed.    Successful 2 vessel PCI involving a very complex/difficult intervention on extensive mid RCA lesion and proximal circumflex with a 90 takeoff of the circumflex.   Plan:  Standard post radial PCI care with TR band removal.  Brachial sheath can be removed 2 hours following discontinuation of Angiomax  Continue dual antiplatelet therapy for minimum one year  Continue  aggressive cardiac risk factor modification and CHF treatment.  The patient does have moderate existing ramus intermedius disease which could potentially be a culprit for angina as well as the occluded OM 2 and OM 3 vessels.   ? The ramus is a potential PCI target, however the OM branches are likely not PCI target.  Patient disposition will be based on recommendation of the primary team/heart failure team. He likely requires additional CHF management prior to discharge.   Leonie Man, M.D., M.S. Interventional Cardiologist    ECHO:  Result status: Final result                              *[facility] [employer].                        [place], [place] [address]                            [phone]  ------------------------------------------------------------------- Echocardiography  Patient:    William Contreras, William Contreras MR #:       [id number] Study Date: 10/30/2015 Gender:     M Age:        54 Height:     180.3 cm Weight:     97.7 kg BSA:        2.24 m^2 Pt. Status: Room:   ATTENDING    Loralie Champagne, M.D.   ORDERING     Loralie Champagne, M.D.  REFERRING    Loralie Champagne, M.D.  SONOGRAPHER  Tresa Res, RDCS  PERFORMING   [facility], Outpatient  cc:  ------------------------------------------------------------------- LV EF: 40% -   45%  ------------------------------------------------------------------- Indications:      CHF - 428.0.  ------------------------------------------------------------------- History:   PMH:   Aortic valve disease.  Risk factors:  Former tobacco use.  ------------------------------------------------------------------- Study Conclusions  - Left ventricle: The cavity size was mildly dilated. There was   mild focal basal hypertrophy of the septum. Systolic function was   mildly to moderately reduced. The estimated ejection fraction was   in the range of 40% to 45%. There is hypokinesis of the   inferolateral myocardium. Doppler parameters are consistent with   abnormal left ventricular relaxation (grade 1 diastolic   dysfunction). - Aortic valve: Possibly bicuspid; severely thickened, severely   calcified leaflets. Valve mobility was restricted. There was   moderate stenosis. There was moderate regurgitation directed   centrally in the LVOT, eccentrically in the LVOT, and towards the   mitral anterior leaflet. Peak velocity (S): 329 cm/s. Mean   gradient (S): 33 mm Hg. Valve area (VTI): 1.42 cm^2. Valve area   (Vmax): 1.22 cm^2. Valve area (Vmean): 1.13 cm^2. - Aorta: Aortic root dimension: 45 mm (ED). - Ascending aorta: The ascending aorta was mildly dilated. - Mitral valve: Calcified annulus. Mildly thickened, mildly   calcified leaflets . There was mild regurgitation. - Left atrium: The atrium was moderately dilated. - Pulmonary arteries: Systolic pressure was mildly increased.  Impressions:  - EF is mildly improved when compared to prior (35%) Prior ECHO   measured aortic root-13mm. Current 28mm. Consider CT or MRI  to   obtain more precise  measurement.  Echocardiography.  M-mode, complete 2D, spectral Doppler, and color Doppler.  Birthdate:  Patient birthdate: 1956/02/01.  Age:  Patient is 61 yr old.  Sex:  Gender: male.    BMI: 30.1 kg/m^2.  Blood pressure:     128/68  Patient status:  Outpatient.  Study date: Study date: 10/30/2015. Study time: 09:51 AM.  Location:  Echo laboratory.  -------------------------------------------------------------------  ------------------------------------------------------------------- Left ventricle:  The cavity size was mildly dilated. There was mild focal basal hypertrophy of the septum. Systolic function was mildly to moderately reduced. The estimated ejection fraction was in the range of 40% to 45%.  Regional wall motion abnormalities:   There is hypokinesis of the inferolateral myocardium. Doppler parameters are consistent with abnormal left ventricular relaxation (grade 1 diastolic dysfunction).  ------------------------------------------------------------------- Aortic valve:   Possibly bicuspid; severely thickened, severely calcified leaflets. Valve mobility was restricted.  Doppler: There was moderate stenosis.   There was moderate regurgitation directed centrally in the LVOT, eccentrically in the LVOT, and towards the mitral anterior leaflet.    VTI ratio of LVOT to aortic valve: 0.29. Valve area (VTI): 1.42 cm^2. Indexed valve area (VTI): 0.64 cm^2/m^2. Peak velocity ratio of LVOT to aortic valve: 0.25. Valve area (Vmax): 1.22 cm^2. Indexed valve area (Vmax): 0.55 cm^2/m^2. Mean velocity ratio of LVOT to aortic valve: 0.23. Valve area (Vmean): 1.13 cm^2. Indexed valve area (Vmean): 0.51 cm^2/m^2.    Mean gradient (S): 33 mm Hg. Peak gradient (S): 43 mm Hg.  ------------------------------------------------------------------- Aorta:  Ascending aorta: The ascending aorta was mildly dilated.  ------------------------------------------------------------------- Mitral  valve:   Calcified annulus. Mildly thickened, mildly calcified leaflets . Mobility was not restricted.  Doppler: Transvalvular velocity was within the normal range. There was no evidence for stenosis. There was mild regurgitation.    Peak gradient (D): 2 mm Hg.  ------------------------------------------------------------------- Left atrium:  The atrium was moderately dilated.  ------------------------------------------------------------------- Right ventricle:  The cavity size was normal. Wall thickness was normal. Systolic function was normal.  ------------------------------------------------------------------- Pulmonic valve:   Poorly visualized.  Structurally normal valve. Cusp separation was normal.  Doppler:  Transvalvular velocity was within the normal range. There was no evidence for stenosis. There was no regurgitation.  ------------------------------------------------------------------- Tricuspid valve:   Structurally normal valve.    Doppler: Transvalvular velocity was within the normal range. There was trivial regurgitation.  ------------------------------------------------------------------- Pulmonary artery:   The main pulmonary artery was normal-sized. Systolic pressure was mildly increased.  ------------------------------------------------------------------- Right atrium:  The atrium was normal in size.  ------------------------------------------------------------------- Pericardium:  A prominent pericardial fat pad was present. There was no pericardial effusion.  ------------------------------------------------------------------- Systemic veins: Inferior vena cava: The vessel was normal in size.  ------------------------------------------------------------------- Measurements   Left ventricle                            Value          Reference  LV ID, ED, PLAX chordal            (H)    63.9  mm       43 - 52  LV ID, ES, PLAX chordal             (H)    51.3  mm       23 - 38  LV fx shortening, PLAX chordal     (L)    20    %        >=  29  LV PW thickness, ED                       11.3  mm       ---------  IVS/LV PW ratio, ED                       1.09           <=1.3  Stroke volume, 2D                         109   ml       ---------  Stroke volume/bsa, 2D                     49    ml/m^2   ---------  LV ejection fraction, 1-p A4C             56    %        ---------  LV end-diastolic volume, 2-p              190   ml       ---------  LV end-systolic volume, 2-p               105   ml       ---------  LV ejection fraction, 2-p                 45    %        ---------  Stroke volume, 2-p                        85    ml       ---------  LV end-diastolic volume/bsa, 2-p          85    ml/m^2   ---------  LV end-systolic volume/bsa, 2-p           47    ml/m^2   ---------  Stroke volume/bsa, 2-p                    38    ml/m^2   ---------  LV e&', lateral                            4.9   cm/s     ---------  LV E/e&', lateral                          15.45          ---------  LV e&', medial                             5.98  cm/s     ---------  LV E/e&', medial                           12.66          ---------  LV e&', average                            5.44  cm/s     ---------  LV E/e&', average  13.92          ---------  Longitudinal strain, TDI                  14    %        ---------    Ventricular septum                        Value          Reference  IVS thickness, ED                         12.3  mm       ---------    LVOT                                      Value          Reference  LVOT ID, S                                25    mm       ---------  LVOT area                                 4.91  cm^2     ---------  LVOT peak velocity, S                     82    cm/s     ---------  LVOT mean velocity, S                     56    cm/s     ---------  LVOT VTI, S                               22.2  cm        ---------    Aortic valve                              Value          Reference  Aortic valve peak velocity, S             329   cm/s     ---------  Aortic valve mean velocity, S             243   cm/s     ---------  Aortic valve VTI, S                       76.8  cm       ---------  Aortic mean gradient, S                   33    mm Hg    ---------  Aortic peak gradient, S                   43    mm Hg    ---------  VTI ratio, LVOT/AV  0.29           ---------  Aortic valve area, VTI                    1.42  cm^2     ---------  Aortic valve area/bsa, VTI                0.64  cm^2/m^2 ---------  Velocity ratio, peak, LVOT/AV             0.25           ---------  Aortic valve area, peak velocity          1.22  cm^2     ---------  Aortic valve area/bsa, peak               0.55  cm^2/m^2 ---------  velocity  Velocity ratio, mean, LVOT/AV             0.23           ---------  Aortic valve area, mean velocity          1.13  cm^2     ---------  Aortic valve area/bsa, mean               0.51  cm^2/m^2 ---------  velocity    Aorta                                     Value          Reference  Aortic root ID, ED                        45    mm       ---------    Left atrium                               Value          Reference  LA ID, A-P, ES                            49    mm       ---------  LA ID/bsa, A-P                            2.19  cm/m^2   <=2.2  LA volume, ES, 1-p A4C                    47.7  ml       ---------  LA volume/bsa, ES, 1-p A4C                21.3  ml/m^2   ---------  LA volume, ES, 1-p A2C                    100   ml       ---------  LA volume/bsa, ES, 1-p A2C                44.7  ml/m^2   ---------    Mitral valve                              Value  Reference  Mitral E-wave peak velocity               75.7  cm/s     ---------  Mitral A-wave peak velocity               77.1  cm/s     ---------  Mitral deceleration time           (H)     408   ms       150 - 230  Mitral peak gradient, D                   2     mm Hg    ---------  Mitral E/A ratio, peak                    1              ---------    Pulmonary arteries                        Value          Reference  PA pressure, S, DP                        30    mm Hg    <=30    Tricuspid valve                           Value          Reference  Tricuspid regurg peak velocity            259   cm/s     ---------  Tricuspid peak RV-RA gradient             27    mm Hg    ---------    Systemic veins                            Value          Reference  Estimated CVP                             3     mm Hg    ---------    Right ventricle                           Value          Reference  RV pressure, S, DP                        30    mm Hg    <=30  RV s&', lateral, S                         18.1  cm/s     ---------  Legend: (L)  and  (H)  mark values outside specified reference range.  ------------------------------------------------------------------- Prepared and Electronically Authenticated by  Candee Furbish, M.D. 2017-04-21T10:59:44     Recent Lab Findings: Lab Results  Component Value Date   WBC 8.9 03/11/2015   HGB 14.4 03/11/2015   HCT 42.6 03/11/2015   PLT 230 03/11/2015   GLUCOSE 90 10/21/2016   CHOL  234 (H) 10/21/2016   TRIG 323 (H) 10/21/2016   HDL 43 10/21/2016   LDLCALC 126 (H) 10/21/2016   ALT 17 09/18/2015   AST 21 09/18/2015   NA 138 10/21/2016   K 4.2 10/21/2016   CL 103 10/21/2016   CREATININE 0.91 10/21/2016   BUN 17 10/21/2016   CO2 24 10/21/2016   TSH 0.941 03/09/2015   INR 1.07 03/10/2015   HGBA1C 5.6 03/09/2015   Aortic Size Index=    5.0     /Body surface area is 2.2 meters squared. = 2.27  < 2.75 cm/m2      4% risk per year 2.75 to 4.25          8% risk per year > 4.25 cm/m2    20% risk per year  The [employer] [facility]) and the [place] [facility]) have issued a statement to clarify 2  previous guidelines from the [facility], [place], and collaborating societies addressing the risk of aortic dissection in patients with bicuspid aortic valves (BAV) and severe aortic enlargement. The 2 guidelines differ with regard to the recommended threshold of aortic root or ascending aortic dilatation that would justify surgical intervention in patients with bicuspid aortic valves. This new statement of clarification uses the ACC/AHA revised structure for delineating the Class of Recommendation and Level of Evidence to provide recommendation that replace those contained in Section 9.2.2.1 of the thoracic aortic disease guidelines and Section 5.1.3 of the valvular heart disease guideline. New recommendations in intervention in patients with BAV and dilatation of the aortic root (sinuses) or ascending aorta include:  . Operative intervention to repair or replace the aortic root (sinuses) or replace the ascending aorta is indicated in asymptomatic patients with BAV if the diameter of the aortic root or ascending aorta is 5.5 cm or greater. (Class of recommendation 1, Level of evidence B-NR).  Marland Kitchen Operative intervention to repair or replace the aortic root (sinuses) or replace the ascending aorta is reasonable in asymptomatic patients with BAV if the diameter of the aortic root or ascending aorta is 5.0 cm or greater and an additional risk factor for dissection is present or if the patient is at low surgical risk and the surgery is performed by an experienced aortic surgical team in a center with established expertise in these procedures. (Class of recommendation IIa; Level of Evidence B-NR).  .   Citation: Donnamarie Poag, Randolm Idol, et al. Surgery for aortic dilatation in patients with bicuspid aortic valves. A statement of clarification from the [employer]/American Heart Association Task Force on Clinical Practice Guidelines. [Published online ahead of print June 13, 2014].  Circulation. doi: 10.1161/CIR.0000000000000331.   Assessment / Plan:    1Possibly bicuspid Aortic ; severely thickened, severely calcified leaflets. There is aortic moderate stenosis.   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  Now with increasing symptoms of AS I have recommended proceeding with elective cardiac cath by Dr  Einar Crow and AVR and replacement of the ascending aorta in the near future .  We also discussed surgical replacement of AVR and ascending aorta,  including alternatives of mechanical vs tissue valves   Plan to see the patient back in after cardiac catheterization and finalize plans for definitive repair of this aortic valve and ascending aorta,   Grace Isaac MD      [address] [place] [address] Office [phone]   Beeper [phone]  01/27/2017 5:16 PM

## 2017-02-15 NOTE — Progress Notes (Signed)
Right ac brachial sheath removed site WNL, 2x2 gauze with tegaderm placed.

## 2017-02-16 ENCOUNTER — Other Ambulatory Visit: Payer: Self-pay | Admitting: *Deleted

## 2017-02-16 DIAGNOSIS — I712 Thoracic aortic aneurysm, without rupture, unspecified: Secondary | ICD-10-CM

## 2017-02-16 DIAGNOSIS — Z01818 Encounter for other preprocedural examination: Secondary | ICD-10-CM

## 2017-02-16 DIAGNOSIS — Q231 Congenital insufficiency of aortic valve: Secondary | ICD-10-CM

## 2017-02-17 ENCOUNTER — Encounter (HOSPITAL_COMMUNITY): Payer: Self-pay

## 2017-02-17 NOTE — Progress Notes (Signed)
FMLA paperwork to release patient back to work completed and emailed to patient per request. Restrictions include no lifting >15 lbs with right arm for 2 weeks. Patient confirmed receiving email. Copy of forms scanned into patient's electronic medical record.  Renee Pain, RN

## 2017-02-20 ENCOUNTER — Encounter (HOSPITAL_COMMUNITY): Payer: BLUE CROSS/BLUE SHIELD

## 2017-02-21 ENCOUNTER — Ambulatory Visit (HOSPITAL_COMMUNITY)
Admission: RE | Admit: 2017-02-21 | Discharge: 2017-02-21 | Disposition: A | Discharge status: Home / Self Care | Payer: BLUE CROSS/BLUE SHIELD | Source: Ambulatory Visit | Attending: Cardiothoracic Surgery | Admitting: Cardiothoracic Surgery

## 2017-02-21 ENCOUNTER — Ambulatory Visit (INDEPENDENT_AMBULATORY_CARE_PROVIDER_SITE_OTHER)
Admission: RE | Admit: 2017-02-21 | Discharge: 2017-02-21 | Disposition: A | Discharge status: Home / Self Care | Payer: BLUE CROSS/BLUE SHIELD | Source: Ambulatory Visit | Attending: Cardiothoracic Surgery | Admitting: Cardiothoracic Surgery

## 2017-02-21 ENCOUNTER — Ambulatory Visit (INDEPENDENT_AMBULATORY_CARE_PROVIDER_SITE_OTHER): Payer: BLUE CROSS/BLUE SHIELD | Admitting: Cardiothoracic Surgery

## 2017-02-21 ENCOUNTER — Encounter: Payer: Self-pay | Admitting: Cardiothoracic Surgery

## 2017-02-21 VITALS — BP 98/58 | HR 72 | Resp 20 | Ht 70.0 in | Wt 221.0 lb

## 2017-02-21 DIAGNOSIS — Z01818 Encounter for other preprocedural examination: Secondary | ICD-10-CM

## 2017-02-21 DIAGNOSIS — I712 Thoracic aortic aneurysm, without rupture, unspecified: Secondary | ICD-10-CM

## 2017-02-21 DIAGNOSIS — Q231 Congenital insufficiency of aortic valve: Secondary | ICD-10-CM

## 2017-02-21 DIAGNOSIS — Q2381 Bicuspid aortic valve: Secondary | ICD-10-CM

## 2017-02-21 DIAGNOSIS — I350 Nonrheumatic aortic (valve) stenosis: Secondary | ICD-10-CM

## 2017-02-21 DIAGNOSIS — I7121 Aneurysm of the ascending aorta, without rupture: Secondary | ICD-10-CM

## 2017-02-21 LAB — VAS US CAROTID
LEFT ECA DIAS: -8 cm/s
LEFT VERTEBRAL DIAS: -11 cm/s
Left CCA dist dias: 19 cm/s
Left CCA dist sys: 55 cm/s
Left CCA prox dias: 18 cm/s
Left CCA prox sys: 64 cm/s
Left ICA dist dias: -22 cm/s
Left ICA dist sys: -56 cm/s
Left ICA prox dias: 11 cm/s
Left ICA prox sys: 58 cm/s
RIGHT CCA MID DIAS: 16 cm/s
RIGHT ECA DIAS: -15 cm/s
RIGHT VERTEBRAL DIAS: 15 cm/s
Right CCA prox sys: 63 cm/s
Right cca dist sys: -45 cm/s

## 2017-02-21 NOTE — Progress Notes (Signed)
[address]       [place] [address]             [phone]                    Cleotha [facility] Medical Record #[id number] Date of Birth: 27-Jun-1956  Referring: Larey Dresser, MD Primary Care: Angelina Sheriff, MD  Chief Complaint:    Chief Complaint  Patient presents with  . Aortic Stenosis    Further discuss surgery, Cardiac Cath 02/15/17  . Thoracic Aortic Aneurysm    History of Present Illness:    Mikiah Mcaffee 60 y.o. male is seen in the office  today after cardiac cath several days ago by Dr Aundra Dubin Cardiac Cath .   I first saw him in January 2018 after a MRI of the chest. He was seen one month ago after repeat cta of chest. Since January he notes increasing symptoms of shortness of breath with exertion and chest discomfort. He has had no syncope or presyncope.   He presented to cardiology in 02/2015 with acute systolic CHF .  Troponin was mildly elevated to 0.04.  Echo showed EF 30-35%. Cardiac Cath showed diffuse severe disease but the LAD disease was relatively mild .  He had PCI with DES to mLCx and proximal to mid RCA.  Cardiac MRI/MRA chest showed EF 39%, bicuspid aortic valve with probable moderate aortic stenosis, 4.9 cm ascending aorta.  Most recent echo in 4/17 with EF 40-45%, moderate AS/moderate AI, 4.5 cm aortic root.     He has no family history of bicuspid valve, dilated aorta , aortic dissection or sudden unexplaind death of 1st degree relatives. His father died in his 86's in MVA, mother is alive at 40. Has brother and sister without known cardiac disease.     Current Activity/ Functional Status:  Patient is independent with mobility/ambulation, transfers, ADL's, IADL's.   Zubrod Score: At the time of surgery this patient's most appropriate activity status/level should be described as: [x]     0    Normal activity, no symptoms []     1    Restricted in physical strenuous activity but ambulatory, able to do out  light work []     2    Ambulatory and capable of self care, unable to do work activities, up and about               >50 % of waking hours                              []     3    Only limited self care, in bed greater than 50% of waking hours []     4    Completely disabled, no self care, confined to bed or chair []     5    Moribund   Past Medical History:  Diagnosis Date  . Essential hypertension   . Morbid obesity ([place])     Past Surgical History:  Procedure Laterality Date  . CARDIAC CATHETERIZATION N/A 03/10/2015   Procedure: Right/Left Heart Cath and Coronary Angiography;  Surgeon: Larey Dresser, MD;  Location: [facility];  Service: Cardiovascular;  Laterality: N/A;  . CARDIAC CATHETERIZATION N/A 03/10/2015   Procedure: Coronary Stent Intervention;  Surgeon: Leonie Man, MD;  Location: [facility];  Service: Cardiovascular;  Laterality: N/A;  . TONSILLECTOMY  Family History  Problem Relation Age of Onset  . Other Father        died young in [place].  Marland Kitchen Heart attack Mother        alive @ 40.  . Other Brother        A & W  . Other Sister        A & W    Social History   Social History  . Marital status: Married    Spouse name: N/A  . Number of children: N/A  . Years of education: N/A   Occupational History  . Not on file.   Social History Main Topics  . Smoking status: Former Smoker    Packs/day: 1.00    Years: 15.00    Types: Cigarettes  . Smokeless tobacco: Never Used     Comment: quit smoking ~ 20 yrs ago.  . Alcohol use No     Comment: prev drank - none in ~ 25 yrs.  . Drug use: No     Comment: prev smoked MJ, none in 25 yrs.  . Sexual activity: Yes   Other Topics Concern  . Not on file   Social History Narrative   Pt lives in [place] with his wife and son.  He has 2 step children and another child - all grown and out of the house.    History  Smoking Status  . Former Smoker  . Packs/day: 1.00  . Years: 15.00  . Types:  Cigarettes  Smokeless Tobacco  . Never Used    Comment: quit smoking ~ 20 yrs ago.    History  Alcohol Use No    Comment: prev drank - none in ~ 25 yrs.     Allergies  Allergen Reactions  . Lipitor [Atorvastatin] Other (See Comments)    Joint and head pains  . Penicillins Other (See Comments)    Lightheadness Has patient had a PCN reaction causing immediate rash, facial/tongue/throat swelling, SOB or lightheadedness with hypotension:unknown Has patient had a PCN reaction causing severe rash involving mucus membranes or skin necrosis: No Has patient had a PCN reaction that required hospitalization: No Has patient had a PCN reaction occurring within the last 10 years: No If all of the above answers are "NO", then may proceed with Cephalosporin use. Childhood allergy     Current Outpatient Prescriptions  Medication Sig Dispense Refill  . aspirin EC 81 MG tablet Take 81 mg by mouth daily.    . calcium citrate-vitamin D (CITRACAL+D) 315-200 MG-UNIT tablet Take 1 tablet by mouth daily.    . carvedilol (COREG) 6.25 MG tablet TAKE ONE TABLET BY MOUTH TWICE DAILY 60 tablet 11  . Coenzyme Q10 (COQ10) 200 MG CAPS Take 200 mg by mouth daily. (Patient taking differently: Take 400 mg by mouth 2 (two) times daily. ) 30 capsule 2  . fluticasone (FLONASE) 50 MCG/ACT nasal spray Place 2 sprays into both nostrils daily as needed for allergies or rhinitis.    . Lactobacillus (ULTIMATE PROBIOTIC FORMULA) CAPS Take 1 capsule by mouth daily as needed (for digestive health.).     Marland Kitchen lisinopril (PRINIVIL,ZESTRIL) 2.5 MG tablet Take 1 tablet (2.5 mg total) by mouth at bedtime. 30 tablet 3  . Magnesium 400 MG TABS Take 400 mg by mouth daily.    Marland Kitchen oxymetazoline (AFRIN) 0.05 % nasal spray Place 1 spray into both nostrils at bedtime as needed for congestion.    . rosuvastatin (CRESTOR) 10 MG tablet TAKE ONE TABLET  BY MOUTH ONCE DAILY (Patient taking differently: TAKE ONE TABLET BY MOUTH ONCE DAILY IN THE  EVENING) 30 tablet 3  . spironolactone (ALDACTONE) 25 MG tablet TAKE 1/2 (ONE-HALF) TABLET BY MOUTH ONCE DAILY 15 tablet 3   No current facility-administered medications for this visit.       Review of Systems:     Cardiac Review of Systems: Y or N  Chest Pain [  n  ]  Resting SOB [ n  ] Exertional SOB n [  ]  Orthopnea [ n ]   Pedal Edema [ n  ]    Palpitations [ n ] Syncope  [n  ]   Presyncope [  n ]  General Review of Systems: [Y] = yes [  ]=no Constitional: recent weight change [ n ];  Wt loss over the last 3 months [   ] anorexia [  ]; fatigue [  ]; nausea [  ]; night sweats [  ]; fever [  ]; or chills [  ];          Dental: poor dentition[  n]; Last Dentist visit: every 6 months sees dentist   Eye : blurred vision [  ]; diplopia [   ]; vision changes [  ];  Amaurosis fugax[  ]; Resp: cough [ n ];  wheezing[ n ];  hemoptysis[ n ]; shortness of breath[y  ]; paroxysmal nocturnal dyspnea[  ]; dyspnea on exertion[  ]; or orthopnea[  ];  GI:  gallstones[n  ], vomiting[  ];  dysphagia[  ]; melena[  ];  hematochezia [  ]; heartburn[  ];   Hx of  Colonoscopy[  ]; GU: kidney stones [  ]; hematuria[  ];   dysuria [  ];  nocturia[  ];  history of     obstruction [  ]; urinary frequency [  ]             Skin: rash, swelling[  ];, hair loss[  ];  peripheral edema[n  ];  or itching[  ]; Musculosketetal: myalgias[  ];  joint swelling[  ];  joint erythema[  ];  joint pain[  ];  back pain[  ];  Heme/Lymph: bruising[  ];  bleeding[  ];  anemia[  ];  Neuro: TIA[ n ];  headaches[  ];  stroke[  ];  vertigo[  ];  seizures[ n ];   paresthesias[n  ];  difficulty walking[n  ];  Psych:depression[  ]; anxiety[  ];  Endocrine: diabetes[n  ];  thyroid dysfunction[n  ];  Immunizations: Flu up to date Florencio.Farrier  ]; Pneumococcal up to date [ n ];  Other:  Physical Exam: BP (!) 98/58   Pulse 72   Resp 20   Ht 5\' 10"  (1.778 m)   Wt 221 lb (100.2 kg)   SpO2 98% Comment: RA  BMI 31.71 kg/m   PHYSICAL  EXAMINATION: Physical Exam  Nursing note and vitals reviewed. Constitutional: He is oriented to person, place, and time. He appears well-nourished. No distress.  HENT:  Head: Normocephalic and atraumatic.  Mouth/Throat: No oropharyngeal exudate.  Eyes: Right eye exhibits no discharge. Left eye exhibits no discharge. No scleral icterus.  Neck: Neck supple. No JVD present. No tracheal deviation present. No thyromegaly present.  Cardiovascular: Normal rate and regular rhythm.  Exam reveals no gallop and no friction rub.   Murmur heard. Respiratory: No stridor. No respiratory distress. He has no wheezes. He has no rales. He exhibits no tenderness.  GI: Soft. Bowel sounds are normal. He exhibits no distension and no mass. There is no tenderness. There is no rebound and no guarding.  Musculoskeletal: Normal range of motion. He exhibits no edema, tenderness or deformity.  Lymphadenopathy:    He has no cervical adenopathy.  Neurological: He is alert and oriented to person, place, and time. He has normal reflexes. No cranial nerve deficit. Coordination normal.  Skin: No rash noted. He is not diaphoretic. No erythema.  Psychiatric: He has a normal mood and affect. His behavior is normal. Thought content normal.  Severe bilateral lower extremity varicosities below the knees   Diagnostic Studies & Laboratory data:     Recent Radiology Findings:   Ct Angio Chest Aorta W &/or Wo Contrast  Result Date: 01/26/2017 CLINICAL DATA:  Pre-op for aneurysm repair and aortic valve replacement. Slight chest pain, SOB on exertion. Fatigue. Nonsmoker over 20 yrs. Heart stents x 2. No hx ca. EXAM: CT ANGIOGRAPHY CHEST WITH CONTRAST TECHNIQUE: Multidetector CT imaging of the chest was performed using the standard protocol during bolus administration of intravenous contrast. Multiplanar CT image reconstructions and MIPs were obtained to evaluate the vascular anatomy. CONTRAST:  75 cc Isovue 370 COMPARISON:  MRA chest  dated 06/24/2016. FINDINGS: Cardiovascular: Ascending thoracic aorta measures 4.9 cm diameter. Atherosclerosis at the aortic valve. Aortic arch and descending thoracic aorta are normal in caliber. Heart size is normal. No pericardial effusion. Coronary artery calcifications. Mediastinum/Nodes: No mass or enlarged lymph nodes within the mediastinum or perihilar regions. Esophagus appears normal. Trachea and central bronchi are unremarkable. Lungs/Pleura: Lungs are clear.  No pleural effusion or pneumothorax. Upper Abdomen: Limited images of the upper abdomen are unremarkable. Musculoskeletal: Degenerative changes throughout the slightly kyphotic thoracic spine, mild to moderate in degree, with evidence of DISH. No acute or suspicious osseous finding. Review of the MIP images confirms the above findings. IMPRESSION: 1. No acute findings. No aortic dissection. No pneumonia or pulmonary edema. No pericardial effusion. 2. 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 3. Aortic atherosclerosis. 4. Coronary artery calcifications, particularly dense within the left main and left circumflex coronary arteries. Recommend correlation with any possible associated cardiac symptoms. Aortic aneurysm NOS (ICD10-I71.9). Aortic Atherosclerosis (ICD10-I70.0). Electronically Signed   By: Franki Cabot M.D.   On: 01/26/2017 15:40   I have independently reviewed the above radiology studies  and reviewed the findings with the patient.    Mr Angiogram Chest W Wo Contrast  Result Date: 06/24/2016 CLINICAL DATA:  Evaluate thoracic aortic aneurysm. EXAM: MRA CHEST WITH CONTRAST TECHNIQUE: Angiographic images of the chest were obtained using MRA technique with intravenous contrast. CONTRAST:  73mL MULTIHANCE GADOBENATE DIMEGLUMINE 529 MG/ML IV SOLN COMPARISON:  Chest MRI - 07/09/2015 FINDINGS: Vascular  Findings: Grossly unchanged fusiform aneurysmal dilatation of the ascending thoracic aorta was measurements as follows. The thoracic aorta tapers to a normal caliber at the level of the aortic arch. Bovine configuration of the aortic arch. The branch vessels of the aortic arch remain widely patent throughout their imaged course. Normal heart size.  No pericardial effusion. Although this examination was not tailored for the evaluation the pulmonary arteries, there are no discrete filling defects within the central pulmonary arterial tree to suggest central pulmonary embolism. Normal caliber of the main pulmonary artery. ------------------------------------------------------------- Thoracic aortic measurements: Sinotubular junction 43 mm as measured in greatest oblique sagittal dimension (image 42, series 1101).  Proximal ascending aorta 51 mm as measured in greatest oblique sagittal dimension at the level of the main pulmonary artery (image 35, series 1101) and approximately 50 mm in greatest oblique axial diameter (image 47, series 12), unchanged since the 06/2015 examination. Aortic arch aorta 30 mm as measured in greatest oblique sagittal dimension (image 39, series 1101). Proximal descending thoracic aorta 29 mm as measured in greatest oblique axial dimension at the level of the main pulmonary artery (image 47, series 12). Distal descending thoracic aorta 27 mm as measured in greatest oblique axial dimension at the level of the diaphragmatic hiatus (sagittal image 27, series 1101). Review of the MIP images confirms the above findings. ------------------------------------------------------------- Non-Vascular Findings: Mediastinum/Lymph Nodes: No bulky mediastinal, hilar axillary lymphadenopathy. Lungs/Pleura: No focal airspace opacities.  No pleural effusion. Upper abdomen: Limited visualization of the upper abdomen is unremarkable. Musculoskeletal: Regional osseous and soft tissue structures are unremarkable.  Susceptibility artifact involves the medial aspect of the left chest, potentially at a location of a loop recorder device. IMPRESSION: Stable uncomplicated fusiform aneurysmal dilatation of the ascending thoracic aorta measuring 51 mm in greatest diameter, unchanged since the 06/2015 examination. Electronically Signed   By: Sandi Mariscal M.D.   On: 06/24/2016 16:34    CLINICAL DATA:  ?Bicuspid aortic valve, dilated ascending aorta, ischemic cardiomyopathy  EXAM: CARDIAC MRI  TECHNIQUE: The patient was scanned on a 1.5 Tesla GE magnet. A dedicated cardiac coil was used. Functional imaging was done using Fiesta sequences. 2,3, and 4 chamber views were done to assess for RWMA's. Modified Simpson's rule using a short axis stack was used to calculate an ejection fraction on a dedicated work Conservation officer, nature. The patient received 35 cc of Multihance. MR angiography was done. After 10 minutes inversion recovery sequences were used to assess for infiltration and scar tissue.  CONTRAST:  35 cc Multihance  FINDINGS: Limited images of the lung fields showed no significant abnormalities.  Mildly dilated left ventricle with moderate LV hypertrophy. The inferior, inferolateral, and anterolateral walls are hypokinetic. The calculated LV ejection fraction is 39%. Normal right ventricular size and systolic function. The right and left atrial sizes are normal. Probably mild mitral regurgitation. The aortic valve is bicuspid. There is mild aortic insufficiency. Visually, there appears to be at least moderate aortic stenosis.  On delayed enhancement imaging, there was 26-50% wall thickness subendocardial late gadolinium enhancement (LGE) in a small area of the mid inferior wall. There is < 25% wall thickness subendocardial LGE in a small area of the mid inferolateral wall.  On MR angiography, there was dilation of the aortic root (4.1 cm) and ascending aorta (4.9 cm). There was a  bovine arch. Pulmonary veins drained normally to the left atrium.  MEASUREMENTS: MEASUREMENTS LV EDV 274 mL  LV SV 108 mL  LV EF 39%  Aortic root 4.1 cm  Ascending aorta 4.9 cm in greatest dimension  Aortic arch 2.8 cm  Descending thoracic aorta 2.7 cm  IMPRESSION: 1. Mildly dillated LV with moderate LV hypertrophy. EF 39% with wall motion abnormalities as above.  2. Bicuspid aortic valve with associated moderate ascending aorta dilatation to maximal 4.9 cm. The aortic valve is at least moderately stenotic visually, suggest correlation by echo.  3. Small areas of LGE in a coronary disease pattern in the inferior and inferolateral walls as described above.  Dalton Mclean   Electronically Signed   By: Loralie Champagne M.D.   On: 07/10/2015 17:48  I have independently reviewed the  above radiology studies  and reviewed the findings with the patient. Cardiac Cath: Procedures   RIGHT HEART CATH AND CORONARY ANGIOGRAPHY  Conclusion   1. Normal filling pressures and preserved cardiac output.  2. Nonobstructive disease in the RCA.  3. Severe diffuse disease in the LCx system involving a moderate ramus and 3 relatively small obtuse marginal branches.  4. Occluded 1st diagonal, likely a moderate vessel.  5. Moderate (50-60%) mid LAD stenosis.  6. 40-50% distal left main stenosis.   Extensive CAD, will need to discuss CABG options with Dr. Servando Snare. Unfortunately, the vessels with the worst disease are relatively small.   Procedural Details/Technique   Technical Details Procedure: Right Heart Cath, Selective Coronary Angiography  Indication: Pre-AVR   Procedural Details: The right brachial and radial areas were prepped, draped, and anesthetized with 1% lidocaine. There was a pre-existing peripheral IV in the right brachial area. This was replaced with a 23F venous sheath. A Swan-Ganz catheter was used for the right heart catheterization. Standard protocol was  followed for recording of right heart pressures and sampling of oxygen saturations. Fick cardiac output was calculated. The right radial artery was entered using modified Seldinger technique and a 36F sheath was placed. The patient received 3 mg IA verapamil and weight-based IV heparin. Standard Judkins catheters were used for selective coronary angiography. There were no immediate procedural complications. The patient was transferred to the post catheterization recovery area for further monitoring.   Estimated blood loss <50 mL.  During this procedure the patient was administered the following to achieve and maintain moderate conscious sedation: Versed 2 mg, Fentanyl 50 mcg, while the patient's heart rate, blood pressure, and oxygen saturation were continuously monitored. The period of conscious sedation was 43 minutes, of which I was present face-to-face 100% of this time.    Coronary Findings   Dominance: Right  Left Main  40-50% distal left main stenosis.  Left Anterior Descending  50-60% mid LAD stenosis. Moderate D1 appears totally occluded with some late collateral filling.  Ramus Intermedius  70% proximal ramus stenosis (moderate vessel).  Left Circumflex  30% proximal LCx stenosis. Stent in proximal LCx is patent. OM1 is a small-moderate vessel bifurcating early. Suspect OM1 has 80% ostial stenosis with subtotal occlusion of the superior branch of OM1. Small OM2 is subtotally occluded at the ostium. Small to moderate PLOM (OM3) is subtotally occluded at the ostium.  Right Coronary Artery  40% proximal RCA stenosis. Following this, there was a long stented segment in the proximal to mid RCA without significant stenosis. 40% mid RCA stenosis distal to stent. 40% distal RCA stenosis just prior to bifurcation with 50% ostial PLV stenosis and 40% ostial PDA stenosis.  Right Heart   Right Heart Pressures RHC Procedural Findings: Hemodynamics (mmHg) RA mean 5 RV 21/8 PA 18/7, mean 11 PCWP  mean 7 AO 97/67  Oxygen saturations: PA 69% AO 100%  Cardiac Output (Fick) 4.75  Cardiac Index (Fick) 2.22         cardiac cath with stents : 03/10/2015 Procedures   Coronary Stent Intervention  Conclusion   1. Prox RCA to Mid RCA lesion, diffuse 50% & focal 80% stenosed. A drug-eluting stent was placed. There is a 0% residual stenosis post intervention. 2. Dist RCA lesion, 50% stenosed. 3. Prox Cx lesion, 80% stenosed. A drug-eluting stent was placed. There is a 0% residual stenosis post intervention. 4. Ost 2nd Mrg to 2nd Mrg lesion & Ost 3rd Mrg to 3rd Mrg lesion, 100% stenosed.  CTO with collateral filling 5. Ramus lesion, 60% stenosed. Mid LAD lesion, 50% stenosed.    Successful 2 vessel PCI involving a very complex/difficult intervention on extensive mid RCA lesion and proximal circumflex with a 90 takeoff of the circumflex.   Plan:  Standard post radial PCI care with TR band removal.  Brachial sheath can be removed 2 hours following discontinuation of Angiomax  Continue dual antiplatelet therapy for minimum one year  Continue aggressive cardiac risk factor modification and CHF treatment.  The patient does have moderate existing ramus intermedius disease which could potentially be a culprit for angina as well as the occluded OM 2 and OM 3 vessels.   ? The ramus is a potential PCI target, however the OM branches are likely not PCI target.  Patient disposition will be based on recommendation of the primary team/heart failure team. He likely requires additional CHF management prior to discharge.   Leonie Man, M.D., M.S. Interventional Cardiologist    ECHO:  Result status: Final result                              *[facility] [employer].                        [place], [place] [id number]                             [phone]  ------------------------------------------------------------------- Echocardiography  Patient:    Krystopher, Kuenzel MR #:       [id number] Study Date: 10/30/2015 Gender:     M Age:        23 Height:     180.3 cm Weight:     97.7 kg BSA:        2.24 m^2 Pt. Status: Room:   ATTENDING    Loralie Champagne, M.D.  ORDERING     Loralie Champagne, M.D.  REFERRING    Loralie Champagne, M.D.  SONOGRAPHER  Tresa Res, RDCS  PERFORMING   [facility], Outpatient  cc:  ------------------------------------------------------------------- LV EF: 40% -   45%  ------------------------------------------------------------------- Indications:      CHF - 428.0.  ------------------------------------------------------------------- History:   PMH:   Aortic valve disease.  Risk factors:  Former tobacco use.  ------------------------------------------------------------------- Study Conclusions  - Left ventricle: The cavity size was mildly dilated. There was   mild focal basal hypertrophy of the septum. Systolic function was   mildly to moderately reduced. The estimated ejection fraction was   in the range of 40% to 45%. There is hypokinesis of the   inferolateral myocardium. Doppler parameters are consistent with   abnormal left ventricular relaxation (grade 1 diastolic   dysfunction). - Aortic valve: Possibly bicuspid; severely thickened, severely   calcified leaflets. Valve mobility was restricted. There was   moderate stenosis. There was moderate regurgitation directed   centrally in the LVOT, eccentrically in the LVOT, and towards the   mitral anterior leaflet. Peak velocity (S): 329 cm/s. Mean   gradient (S): 33 mm Hg. Valve area (VTI): 1.42 cm^2. Valve area   (Vmax): 1.22 cm^2. Valve area (Vmean): 1.13  cm^2. - Aorta: Aortic root dimension: 45 mm (ED). - Ascending aorta: The ascending aorta was mildly dilated. - Mitral valve: Calcified annulus. Mildly thickened, mildly   calcified  leaflets . There was mild regurgitation. - Left atrium: The atrium was moderately dilated. - Pulmonary arteries: Systolic pressure was mildly increased.  Impressions:  - EF is mildly improved when compared to prior (35%) Prior ECHO   measured aortic root-57mm. Current 69mm. Consider CT or MRI to   obtain more precise measurement.  Echocardiography.  M-mode, complete 2D, spectral Doppler, and color Doppler.  Birthdate:  Patient birthdate: 1955/12/13.  Age:  Patient is 61 yr old.  Sex:  Gender: male.    BMI: 30.1 kg/m^2.  Blood pressure:     128/68  Patient status:  Outpatient.  Study date: Study date: 10/30/2015. Study time: 09:51 AM.  Location:  Echo laboratory.  -------------------------------------------------------------------  ------------------------------------------------------------------- Left ventricle:  The cavity size was mildly dilated. There was mild focal basal hypertrophy of the septum. Systolic function was mildly to moderately reduced. The estimated ejection fraction was in the range of 40% to 45%.  Regional wall motion abnormalities:   There is hypokinesis of the inferolateral myocardium. Doppler parameters are consistent with abnormal left ventricular relaxation (grade 1 diastolic dysfunction).  ------------------------------------------------------------------- Aortic valve:   Possibly bicuspid; severely thickened, severely calcified leaflets. Valve mobility was restricted.  Doppler: There was moderate stenosis.   There was moderate regurgitation directed centrally in the LVOT, eccentrically in the LVOT, and towards the mitral anterior leaflet.    VTI ratio of LVOT to aortic valve: 0.29. Valve area (VTI): 1.42 cm^2. Indexed valve area (VTI): 0.64 cm^2/m^2. Peak velocity ratio of LVOT to aortic valve: 0.25. Valve area (Vmax): 1.22 cm^2. Indexed valve area (Vmax): 0.55 cm^2/m^2. Mean velocity ratio of LVOT to aortic valve: 0.23. Valve area (Vmean): 1.13  cm^2. Indexed valve area (Vmean): 0.51 cm^2/m^2.    Mean gradient (S): 33 mm Hg. Peak gradient (S): 43 mm Hg.  ------------------------------------------------------------------- Aorta:  Ascending aorta: The ascending aorta was mildly dilated.  ------------------------------------------------------------------- Mitral valve:   Calcified annulus. Mildly thickened, mildly calcified leaflets . Mobility was not restricted.  Doppler: Transvalvular velocity was within the normal range. There was no evidence for stenosis. There was mild regurgitation.    Peak gradient (D): 2 mm Hg.  ------------------------------------------------------------------- Left atrium:  The atrium was moderately dilated.  ------------------------------------------------------------------- Right ventricle:  The cavity size was normal. Wall thickness was normal. Systolic function was normal.  ------------------------------------------------------------------- Pulmonic valve:   Poorly visualized.  Structurally normal valve. Cusp separation was normal.  Doppler:  Transvalvular velocity was within the normal range. There was no evidence for stenosis. There was no regurgitation.  ------------------------------------------------------------------- Tricuspid valve:   Structurally normal valve.    Doppler: Transvalvular velocity was within the normal range. There was trivial regurgitation.  ------------------------------------------------------------------- Pulmonary artery:   The main pulmonary artery was normal-sized. Systolic pressure was mildly increased.  ------------------------------------------------------------------- Right atrium:  The atrium was normal in size.  ------------------------------------------------------------------- Pericardium:  A prominent pericardial fat pad was present. There was no pericardial  effusion.  ------------------------------------------------------------------- Systemic veins: Inferior vena cava: The vessel was normal in size.  ------------------------------------------------------------------- Measurements   Left ventricle                            Value          Reference  LV ID, ED, PLAX chordal            (  H)    63.9  mm       43 - 52  LV ID, ES, PLAX chordal            (H)    51.3  mm       23 - 38  LV fx shortening, PLAX chordal     (L)    20    %        >=29  LV PW thickness, ED                       11.3  mm       ---------  IVS/LV PW ratio, ED                       1.09           <=1.3  Stroke volume, 2D                         109   ml       ---------  Stroke volume/bsa, 2D                     49    ml/m^2   ---------  LV ejection fraction, 1-p A4C             56    %        ---------  LV end-diastolic volume, 2-p              190   ml       ---------  LV end-systolic volume, 2-p               105   ml       ---------  LV ejection fraction, 2-p                 45    %        ---------  Stroke volume, 2-p                        85    ml       ---------  LV end-diastolic volume/bsa, 2-p          85    ml/m^2   ---------  LV end-systolic volume/bsa, 2-p           47    ml/m^2   ---------  Stroke volume/bsa, 2-p                    38    ml/m^2   ---------  LV e&', lateral                            4.9   cm/s     ---------  LV E/e&', lateral                          15.45          ---------  LV e&', medial                             5.98  cm/s     ---------  LV E/e&', medial  12.66          ---------  LV e&', average                            5.44  cm/s     ---------  LV E/e&', average                          13.92          ---------  Longitudinal strain, TDI                  14    %        ---------    Ventricular septum                        Value          Reference  IVS thickness, ED                         12.3  mm        ---------    LVOT                                      Value          Reference  LVOT ID, S                                25    mm       ---------  LVOT area                                 4.91  cm^2     ---------  LVOT peak velocity, S                     82    cm/s     ---------  LVOT mean velocity, S                     56    cm/s     ---------  LVOT VTI, S                               22.2  cm       ---------    Aortic valve                              Value          Reference  Aortic valve peak velocity, S             329   cm/s     ---------  Aortic valve mean velocity, S             243   cm/s     ---------  Aortic valve VTI, S                       76.8  cm       ---------  Aortic mean gradient, S  33    mm Hg    ---------  Aortic peak gradient, S                   43    mm Hg    ---------  VTI ratio, LVOT/AV                        0.29           ---------  Aortic valve area, VTI                    1.42  cm^2     ---------  Aortic valve area/bsa, VTI                0.64  cm^2/m^2 ---------  Velocity ratio, peak, LVOT/AV             0.25           ---------  Aortic valve area, peak velocity          1.22  cm^2     ---------  Aortic valve area/bsa, peak               0.55  cm^2/m^2 ---------  velocity  Velocity ratio, mean, LVOT/AV             0.23           ---------  Aortic valve area, mean velocity          1.13  cm^2     ---------  Aortic valve area/bsa, mean               0.51  cm^2/m^2 ---------  velocity    Aorta                                     Value          Reference  Aortic root ID, ED                        45    mm       ---------    Left atrium                               Value          Reference  LA ID, A-P, ES                            49    mm       ---------  LA ID/bsa, A-P                            2.19  cm/m^2   <=2.2  LA volume, ES, 1-p A4C                    47.7  ml       ---------  LA volume/bsa, ES, 1-p A4C                 21.3  ml/m^2   ---------  LA volume, ES, 1-p A2C                    100  ml       ---------  LA volume/bsa, ES, 1-p A2C                44.7  ml/m^2   ---------    Mitral valve                              Value          Reference  Mitral E-wave peak velocity               75.7  cm/s     ---------  Mitral A-wave peak velocity               77.1  cm/s     ---------  Mitral deceleration time           (H)    408   ms       150 - 230  Mitral peak gradient, D                   2     mm Hg    ---------  Mitral E/A ratio, peak                    1              ---------    Pulmonary arteries                        Value          Reference  PA pressure, S, DP                        30    mm Hg    <=30    Tricuspid valve                           Value          Reference  Tricuspid regurg peak velocity            259   cm/s     ---------  Tricuspid peak RV-RA gradient             27    mm Hg    ---------    Systemic veins                            Value          Reference  Estimated CVP                             3     mm Hg    ---------    Right ventricle                           Value          Reference  RV pressure, S, DP                        30    mm Hg    <=30  RV s&', lateral, S  18.1  cm/s     ---------  Legend: (L)  and  (H)  mark values outside specified reference range.  ------------------------------------------------------------------- Prepared and Electronically Authenticated by  Candee Furbish, M.D. 2017-04-21T10:59:44     Recent Lab Findings: Lab Results  Component Value Date   WBC 5.5 02/15/2017   HGB 14.2 02/15/2017   HCT 41.0 02/15/2017   PLT 198 02/15/2017   GLUCOSE 95 02/15/2017   CHOL 234 (H) 10/21/2016   TRIG 323 (H) 10/21/2016   HDL 43 10/21/2016   LDLCALC 126 (H) 10/21/2016   ALT 17 09/18/2015   AST 21 09/18/2015   NA 137 02/15/2017   K 4.3 02/15/2017   CL 105 02/15/2017   CREATININE 0.99 02/15/2017   BUN 17 02/15/2017    CO2 27 02/15/2017   TSH 0.941 03/09/2015   INR 1.04 02/15/2017   HGBA1C 5.6 03/09/2015   Aortic Size Index=    5.0     /Body surface area is 2.22 meters squared. = 2.27  < 2.75 cm/m2      4% risk per year 2.75 to 4.25          8% risk per year > 4.25 cm/m2    20% risk per year  The [employer] [facility]) and the [place]) have issued a statement to clarify 2 previous guidelines from the [facility], [place], and collaborating societies addressing the risk of aortic dissection in patients with bicuspid aortic valves (BAV) and severe aortic enlargement. The 2 guidelines differ with regard to the recommended threshold of aortic root or ascending aortic dilatation that would justify surgical intervention in patients with bicuspid aortic valves. This new statement of clarification uses the ACC/AHA revised structure for delineating the Class of Recommendation and Level of Evidence to provide recommendation that replace those contained in Section 9.2.2.1 of the thoracic aortic disease guidelines and Section 5.1.3 of the valvular heart disease guideline. New recommendations in intervention in patients with BAV and dilatation of the aortic root (sinuses) or ascending aorta include:  . Operative intervention to repair or replace the aortic root (sinuses) or replace the ascending aorta is indicated in asymptomatic patients with BAV if the diameter of the aortic root or ascending aorta is 5.5 cm or greater. (Class of recommendation 1, Level of evidence B-NR).  Marland Kitchen Operative intervention to repair or replace the aortic root (sinuses) or replace the ascending aorta is reasonable in asymptomatic patients with BAV if the diameter of the aortic root or ascending aorta is 5.0 cm or greater and an additional risk factor for dissection is present or if the patient is at low surgical risk and the surgery is performed by an experienced aortic surgical team in a center with established expertise in  these procedures. (Class of recommendation IIa; Level of Evidence B-NR).  .   Citation: Donnamarie Poag, Randolm Idol, et al. Surgery for aortic dilatation in patients with bicuspid aortic valves. A statement of clarification from the [employer]/American Heart Association Task Force on Clinical Practice Guidelines. [Published online ahead of print June 13, 2014]. Circulation. doi: 10.1161/CIR.0000000000000331.   Assessment / Plan:    1Possibly bicuspid Aortic ; severely thickened, severely calcified leaflets. There is aortic moderate stenosis.   There was aortic moderate   regurgitation now with increasing symptoms. 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  Now with increasing symptoms of AS I have recommended proceeding  AVR and replacement of the ascending aorta  .  We also discussed surgical replacement of AVR and ascending aorta, including alternatives of mechanical vs tissue valves. Patient prefers tissue valve. Tentative plan September 4.   The goals risks and alternatives of the planned surgical procedure   have been discussed with the patient in detail. The risks of the procedure including death, infection, stroke, myocardial infarction, bleeding, blood transfusion, heart block  and need for pacemakerblock have all been discussed specifically.  I have quoted Gail Jaskowiak a 3 % of perioperative mortality and a complication rate as high as 40%. The patient's questions have been answered.Kedarius Costello is willing  to proceed with the planned procedure.  Grace Isaac MD      [address] [place]  [address] Office [phone]   Beeper [phone]  02/21/2017 4:14 PM

## 2017-02-22 ENCOUNTER — Other Ambulatory Visit: Payer: Self-pay | Admitting: *Deleted

## 2017-02-22 DIAGNOSIS — I350 Nonrheumatic aortic (valve) stenosis: Secondary | ICD-10-CM

## 2017-02-22 DIAGNOSIS — I351 Nonrheumatic aortic (valve) insufficiency: Secondary | ICD-10-CM

## 2017-02-22 DIAGNOSIS — I712 Thoracic aortic aneurysm, without rupture, unspecified: Secondary | ICD-10-CM

## 2017-03-08 ENCOUNTER — Encounter (HOSPITAL_COMMUNITY): Payer: Self-pay

## 2017-03-08 NOTE — Pre-Procedure Instructions (Signed)
[place]  03/08/2017      [place], [place] - [address] [place] [address] Phone: [phone] Fax: [phone]    Your procedure is scheduled on Tuesday, March 14, 2017  Report to [facility] at [place].M.  Call this number if you have problems the morning of surgery:  [phone]   Remember:  Do not eat food or drink liquids after midnight.  Take these medicines the morning of surgery with A SIP OF WATER Carvedilol (Coreg), fluticasone (Flonase) if needed.    7 days prior to surgery STOP taking any Aspirin, Aleve, Naproxen, Ibuprofen, Motrin, Advil, Goody's, BC's, all herbal  medications, fish oil, and all vitamins    Do not wear jewelry.  Do not wear lotions, powders, cologne, or deodorant.  Men may shave face and neck.  Do not bring valuables to the hospital.  [facility] is not responsible for any belongings or valuables.  Contacts, dentures or bridgework may not be worn into surgery.  Leave your suitcase in the car.  After surgery it may be brought to your room.  For patients admitted to the hospital, discharge time will be determined by your treatment team.  Patients discharged the day of surgery will not be allowed to drive home.   Special instructions:   [facility]- Preparing For Surgery  Before surgery, you can play an important role. Because skin is not sterile, your skin needs to be as free of germs as possible. You can reduce the number of germs on your skin by washing with CHG (chlorahexidine gluconate) Soap before surgery.  CHG is an antiseptic cleaner which kills germs and bonds with the skin to continue killing germs even after washing.  Please do not use if you have an allergy to CHG or antibacterial soaps. If your skin becomes reddened/irritated stop using the CHG.  Do not shave (including legs and underarms) for at least 48 hours prior to first CHG shower. It is  OK to shave your face.  Please follow these instructions carefully.   1. Shower the NIGHT BEFORE SURGERY and the MORNING OF SURGERY with CHG.   2. If you chose to wash your hair, wash your hair first as usual with your normal shampoo.  3. After you shampoo, rinse your hair and body thoroughly to remove the shampoo.  4. Use CHG as you would any other liquid soap. You can apply CHG directly to the skin and wash gently with a scrungie or a clean washcloth.   5. Apply the CHG Soap to your body ONLY FROM THE NECK DOWN.  Do not use on open wounds or open sores. Avoid contact with your eyes, ears, mouth and genitals (private parts). Wash genitals (private parts) with your normal soap.  6. Wash thoroughly, paying special attention to the area where your surgery will be performed.  7. Thoroughly rinse your body with warm water from the neck down.  8. DO NOT shower/wash with your normal soap after using and rinsing off the CHG Soap.  9. Pat yourself dry with a CLEAN TOWEL.   10. Wear CLEAN PAJAMAS   11. Place CLEAN SHEETS on your bed the night of your first shower and DO NOT SLEEP WITH PETS.    Day of Surgery: Do not apply any deodorants/lotions. Please wear clean clothes to the hospital/surgery center.      Please read over the following  fact sheets that you were given. Pain Booklet, Coughing and Deep Breathing, Open Heart Packet, MRSA Information and Surgical Site Infection Prevention

## 2017-03-09 ENCOUNTER — Encounter (HOSPITAL_COMMUNITY)
Admission: RE | Admit: 2017-03-09 | Discharge: 2017-03-09 | Disposition: A | Discharge status: Home / Self Care | Payer: BLUE CROSS/BLUE SHIELD | Source: Ambulatory Visit | Attending: Cardiothoracic Surgery | Admitting: Cardiothoracic Surgery

## 2017-03-09 ENCOUNTER — Encounter (HOSPITAL_COMMUNITY): Payer: Self-pay

## 2017-03-09 ENCOUNTER — Ambulatory Visit (HOSPITAL_COMMUNITY)
Admission: RE | Admit: 2017-03-09 | Discharge: 2017-03-09 | Disposition: A | Discharge status: Home / Self Care | Payer: BLUE CROSS/BLUE SHIELD | Source: Ambulatory Visit | Attending: Cardiothoracic Surgery | Admitting: Cardiothoracic Surgery

## 2017-03-09 DIAGNOSIS — I351 Nonrheumatic aortic (valve) insufficiency: Secondary | ICD-10-CM

## 2017-03-09 DIAGNOSIS — I350 Nonrheumatic aortic (valve) stenosis: Secondary | ICD-10-CM

## 2017-03-09 DIAGNOSIS — I712 Thoracic aortic aneurysm, without rupture, unspecified: Secondary | ICD-10-CM

## 2017-03-09 HISTORY — DX: Sleep apnea, unspecified: G47.30

## 2017-03-09 HISTORY — DX: Cardiac murmur, unspecified: R01.1

## 2017-03-09 HISTORY — DX: Heart failure, unspecified: I50.9

## 2017-03-09 HISTORY — DX: Myoneural disorder, unspecified: G70.9

## 2017-03-09 HISTORY — DX: Gastro-esophageal reflux disease without esophagitis: K21.9

## 2017-03-09 HISTORY — DX: Atherosclerotic heart disease of native coronary artery without angina pectoris: I25.10

## 2017-03-09 LAB — PULMONARY FUNCTION TEST
DL/VA % pred: 90 %
DL/VA: 4.2 ml/min/mmHg/L
DLCO cor % pred: 96 %
DLCO cor: 31.19 ml/min/mmHg
DLCO unc % pred: 96 %
DLCO unc: 31.28 ml/min/mmHg
FEF 25-75 Post: 5.41 L/sec
FEF 25-75 Pre: 4.86 L/sec
FEF2575-%Change-Post: 11 %
FEF2575-%Pred-Post: 180 %
FEF2575-%Pred-Pre: 161 %
FEV1-%Change-Post: 3 %
FEV1-%Pred-Post: 117 %
FEV1-%Pred-Pre: 114 %
FEV1-Post: 4.27 L
FEV1-Pre: 4.13 L
FEV1FVC-%Change-Post: 1 %
FEV1FVC-%Pred-Pre: 108 %
FEV6-%Change-Post: 3 %
FEV6-%Pred-Post: 111 %
FEV6-%Pred-Pre: 107 %
FEV6-Post: 5.1 L
FEV6-Pre: 4.91 L
FEV6FVC-%Change-Post: 1 %
FEV6FVC-%Pred-Post: 103 %
FEV6FVC-%Pred-Pre: 101 %
FVC-%Change-Post: 2 %
FVC-%Pred-Post: 106 %
FVC-%Pred-Pre: 104 %
FVC-Post: 5.11 L
FVC-Pre: 5.01 L
Post FEV1/FVC ratio: 84 %
Post FEV6/FVC ratio: 100 %
Pre FEV1/FVC ratio: 82 %
Pre FEV6/FVC Ratio: 98 %
RV % pred: 87 %
RV: 1.95 L
TLC % pred: 105 %
TLC: 7.4 L

## 2017-03-09 LAB — HEMOGLOBIN A1C
Hgb A1c MFr Bld: 5.1 % (ref 4.8–5.6)
Mean Plasma Glucose: 99.67 mg/dL

## 2017-03-09 LAB — URINALYSIS, ROUTINE W REFLEX MICROSCOPIC
Bilirubin Urine: NEGATIVE
Glucose, UA: NEGATIVE mg/dL
Hgb urine dipstick: NEGATIVE
Ketones, ur: NEGATIVE mg/dL
Leukocytes, UA: NEGATIVE
Nitrite: NEGATIVE
Protein, ur: NEGATIVE mg/dL
Specific Gravity, Urine: 1.012 (ref 1.005–1.030)
pH: 7 (ref 5.0–8.0)

## 2017-03-09 LAB — CBC
HCT: 42.6 % (ref 39.0–52.0)
Hemoglobin: 14.8 g/dL (ref 13.0–17.0)
MCH: 32 pg (ref 26.0–34.0)
MCHC: 34.7 g/dL (ref 30.0–36.0)
MCV: 92.2 fL (ref 78.0–100.0)
Platelets: 215 10*3/uL (ref 150–400)
RBC: 4.62 MIL/uL (ref 4.22–5.81)
RDW: 11.9 % (ref 11.5–15.5)
WBC: 6.4 10*3/uL (ref 4.0–10.5)

## 2017-03-09 LAB — BLOOD GAS, ARTERIAL
Acid-base deficit: 0.1 mmol/L (ref 0.0–2.0)
Bicarbonate: 23.4 mmol/L (ref 20.0–28.0)
Drawn by: 244901
FIO2: 21
O2 Saturation: 97.6 %
Patient temperature: 98.6
pCO2 arterial: 33.8 mmHg (ref 32.0–48.0)
pH, Arterial: 7.455 — ABNORMAL HIGH (ref 7.350–7.450)
pO2, Arterial: 94.2 mmHg (ref 83.0–108.0)

## 2017-03-09 LAB — COMPREHENSIVE METABOLIC PANEL
ALT: 20 U/L (ref 17–63)
AST: 21 U/L (ref 15–41)
Albumin: 4.1 g/dL (ref 3.5–5.0)
Alkaline Phosphatase: 41 U/L (ref 38–126)
Anion gap: 8 (ref 5–15)
BUN: 16 mg/dL (ref 6–20)
CO2: 26 mmol/L (ref 22–32)
Calcium: 9.3 mg/dL (ref 8.9–10.3)
Chloride: 103 mmol/L (ref 101–111)
Creatinine, Ser: 0.99 mg/dL (ref 0.61–1.24)
GFR calc Af Amer: >60 mL/min (ref >60)
GFR calc non Af Amer: >60 mL/min (ref >60)
Glucose, Bld: 108 mg/dL — ABNORMAL HIGH (ref 65–99)
Potassium: 4.2 mmol/L (ref 3.5–5.1)
Sodium: 137 mmol/L (ref 135–145)
Total Bilirubin: 1.1 mg/dL (ref 0.3–1.2)
Total Protein: 7 g/dL (ref 6.5–8.1)

## 2017-03-09 LAB — APTT: aPTT: 29 seconds (ref 24–36)

## 2017-03-09 LAB — SURGICAL PCR SCREEN
MRSA, PCR: NEGATIVE
Staphylococcus aureus: NEGATIVE

## 2017-03-09 LAB — PROTIME-INR
INR: 1
Prothrombin Time: 13.1 seconds (ref 11.4–15.2)

## 2017-03-09 LAB — ABO/RH: ABO/RH(D): O POS

## 2017-03-09 LAB — TYPE AND SCREEN
ABO/RH(D): O POS
Antibody Screen: NEGATIVE

## 2017-03-09 MED ORDER — ALBUTEROL SULFATE (2.5 MG/3ML) 0.083% IN NEBU
2.5000 mg | INHALATION_SOLUTION | Freq: Once | RESPIRATORY_TRACT | Status: AC
  Administered 2017-03-09: 2.5 mg via RESPIRATORY_TRACT

## 2017-03-09 NOTE — Progress Notes (Signed)
Pre-op Cardiac Surgery  Carotid Findings:  Completed 02/21/17. Results can be found in [facility].  Upper Extremity Right Left  Brachial Pressures 121-Triphasic 122-Triphasic  Radial Waveforms Triphasic Triphasic  Ulnar Waveforms Triphasic Triphasic  Palmar Arch (Allen's Test) Signal is unaffected with radial compression, decreases >50% with ulnar compression. Signal obliterates with radial compression, is unaffected with ulnar compression.   03/09/2017 12:06 PM Maudry Mayhew, BS, RVT, RDCS, RDMS

## 2017-03-09 NOTE — Progress Notes (Signed)
Pt. Followed by PC at [facility] in [place], Dr. Aundra Dubin takes care of pt.'s cardiac needs. Pt. Denies chest concerns today.  Pt. Reports that only time he  has had anesth. was when he was 61 y.o for a tonsillectomy, with exception of sedation for cardiac testing.

## 2017-03-10 ENCOUNTER — Other Ambulatory Visit: Payer: Self-pay | Admitting: *Deleted

## 2017-03-10 DIAGNOSIS — I712 Thoracic aortic aneurysm, without rupture, unspecified: Secondary | ICD-10-CM

## 2017-03-10 DIAGNOSIS — I351 Nonrheumatic aortic (valve) insufficiency: Secondary | ICD-10-CM

## 2017-03-10 DIAGNOSIS — I350 Nonrheumatic aortic (valve) stenosis: Secondary | ICD-10-CM

## 2017-03-10 NOTE — Progress Notes (Signed)
Anesthesia Chart Review: Patient is a 61 year old male scheduled for [place] procedure replacement of ascending aorta, AV replacement, CABG on 03/14/17 by Dr. Lanelle Bal.  History includes HTN, former smoker, bicuspid AV with moderate AS/AR, CAD/NSTEMI s/p DES RCA and LCX 07/6107, systolic CHF, ascending TAA, OSA (reportedly improved after 50 lb weight loss), GERD, tonsillectomy, burning in feet. BMI is consistent with obesity.   PCP is Dr. Lovette Cliche with [facility]. Cardiologist is Dr. Loralie Champagne.  Meds include ASA 81 mg, Coreg, Flonase, lisinopril, magnesium, Afrin, Crestor, Aldactone.   BP (!) 117/53   Pulse (!) 52   Temp 36.8 C (Oral)   Resp 20   Ht 5\' 10"  (1.778 m)   Wt 217 lb 7 oz (98.6 kg)   BMI 31.20 kg/m   EKG 02/15/17: SB at 56 bpm with occasional PVCs, LAFB, LVH with QRS widening and repolarization abnormality.  Cardiac cath 02/15/17: 1. Normal filling pressures and preserved cardiac output.  2. Nonobstructive disease in the RCA.  3. Severe diffuse disease in the LCx system involving a moderate ramus and 3 relatively small obtuse marginal branches.  4. Occluded 1st diagonal, likely a moderate vessel.  5. Moderate (50-60%) mid LAD stenosis.  6. 40-50% distal left main stenosis.  Extensive CAD, will need to discuss CABG options with Dr. Servando Snare. Unfortunately, the vessels with the worst disease are relatively small.   Echo 10/21/16: Study Conclusions - Left ventricle: The cavity size was normal. Wall thickness was   increased in a pattern of mild LVH. The estimated ejection   fraction was 45%. Inferolateral hypokinesis. Doppler parameters   are consistent with abnormal left ventricular relaxation (grade 1   diastolic dysfunction). - Aortic valve: Bicuspid; severely calcified leaflets. There was   moderate stenosis. There was mild to moderate regurgitation. Mean   gradient (S): 38 mm Hg. Valve area (VTI): 1.3 cm^2. - Aorta: Severely dilated  ascending aorta. Aortic root dimension:   43 mm (ED). Ascending aortic diameter: 51 mm (S). - Mitral valve: There was mild regurgitation. - Right ventricle: The cavity size was normal. Systolic function   was normal. - Tricuspid valve: Peak RV-RA gradient (S): 20 mm Hg. - Pulmonary arteries: PA peak pressure: 23 mm Hg (S). - Inferior vena cava: The vessel was normal in size. The   respirophasic diameter changes were in the normal range (= 50%),   consistent with normal central venous pressure. Impressions: - Normal LV size with mild LV hypertrophy. EF 45% with   inferolateral hypokinesis. Bicuspid aortic valve with moderate   (mean gradient 38 mmHg) aortic stenosis. Severely dilated   ascending aorta, 5.1 cm. Normal RV size and systolic function.  Carotid Duplex 02/21/17: Impression:  Patent bilateral ICAs without evidence of hemodynamically significant stenosis.  CTA Chest 01/25/17: IMPRESSION: 1. No acute findings. No aortic dissection. No pneumonia or pulmonary edema. No pericardial effusion. 2. 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 3. Aortic atherosclerosis. 4. Coronary artery calcifications, particularly dense within the left main and left circumflex coronary arteries. Recommend correlation with any possible associated cardiac symptoms.  CXR 03/09/17: IMPRESSION: No acute cardiopulmonary disease.  PFTs 03/09/17: FVC 5.01 (104%), FEV1 4.13 (114%), DLCO unc 31.28 (96%).  Preoperative labs noted. A1c 5.1. UA WNL.   If no acute changes then I anticpate that he can proceed as planned.  Myra Gianotti, PA-C [facility]/Anesthesiology Phone [phone] 03/10/2017 12:44 PM

## 2017-03-12 MED ORDER — DEXTROSE 5 % IV SOLN
750.0000 mg | INTRAVENOUS | Status: DC
  Filled 2017-03-12: qty 750

## 2017-03-12 MED ORDER — EPINEPHRINE PF 1 MG/ML IJ SOLN
0.0000 ug/min | INTRAVENOUS | Status: DC
  Filled 2017-03-12 (×2): qty 4

## 2017-03-12 MED ORDER — VANCOMYCIN HCL 10 G IV SOLR
1500.0000 mg | INTRAVENOUS | Status: AC
  Administered 2017-03-14: 1500 mg via INTRAVENOUS
  Filled 2017-03-12: qty 1500

## 2017-03-12 MED ORDER — SODIUM CHLORIDE 0.9 % IV SOLN
INTRAVENOUS | Status: DC
  Filled 2017-03-12: qty 30

## 2017-03-12 MED ORDER — TRANEXAMIC ACID 1000 MG/10ML IV SOLN
1.5000 mg/kg/h | INTRAVENOUS | Status: AC
  Administered 2017-03-14: 1.5 mg/kg/h via INTRAVENOUS
  Filled 2017-03-12: qty 25

## 2017-03-12 MED ORDER — MAGNESIUM SULFATE 50 % IJ SOLN
40.0000 meq | INTRAMUSCULAR | Status: DC
  Filled 2017-03-12: qty 10

## 2017-03-12 MED ORDER — TRANEXAMIC ACID (OHS) BOLUS VIA INFUSION
15.0000 mg/kg | INTRAVENOUS | Status: AC
  Administered 2017-03-14: 1479 mg via INTRAVENOUS
  Filled 2017-03-12: qty 1479

## 2017-03-12 MED ORDER — SODIUM CHLORIDE 0.9 % IV SOLN
30.0000 ug/min | INTRAVENOUS | Status: AC
  Administered 2017-03-14: 25 ug/min via INTRAVENOUS
  Filled 2017-03-12: qty 2

## 2017-03-12 MED ORDER — DEXTROSE 5 % IV SOLN
1.5000 g | INTRAVENOUS | Status: DC
  Filled 2017-03-12: qty 1.5

## 2017-03-12 MED ORDER — NITROGLYCERIN IN D5W 200-5 MCG/ML-% IV SOLN
2.0000 ug/min | INTRAVENOUS | Status: AC
  Administered 2017-03-14: 5 ug/min via INTRAVENOUS
  Filled 2017-03-12: qty 250

## 2017-03-12 MED ORDER — PAPAVERINE HCL 30 MG/ML IJ SOLN
INTRAMUSCULAR | Status: AC
Start: 2017-03-14 — End: 2017-03-14
  Administered 2017-03-14: 500 mL
  Filled 2017-03-12: qty 2.5

## 2017-03-12 MED ORDER — SODIUM CHLORIDE 0.9 % IV SOLN
INTRAVENOUS | Status: AC
  Administered 2017-03-14: .8 [IU]/h via INTRAVENOUS
  Filled 2017-03-12: qty 1

## 2017-03-12 MED ORDER — DOPAMINE-DEXTROSE 3.2-5 MG/ML-% IV SOLN
0.0000 ug/kg/min | INTRAVENOUS | Status: AC
  Administered 2017-03-14: 3 ug/kg/min via INTRAVENOUS
  Filled 2017-03-12: qty 250

## 2017-03-12 MED ORDER — DEXMEDETOMIDINE HCL IN NACL 400 MCG/100ML IV SOLN
0.1000 ug/kg/h | INTRAVENOUS | Status: AC
  Administered 2017-03-14: 0.7 ug/kg/h via INTRAVENOUS
  Filled 2017-03-12 (×2): qty 100

## 2017-03-12 MED ORDER — POTASSIUM CHLORIDE 2 MEQ/ML IV SOLN
80.0000 meq | INTRAVENOUS | Status: DC
  Filled 2017-03-12: qty 40

## 2017-03-12 MED ORDER — TRANEXAMIC ACID (OHS) PUMP PRIME SOLUTION
2.0000 mg/kg | INTRAVENOUS | Status: DC
  Filled 2017-03-12: qty 1.97

## 2017-03-13 MED ORDER — METOPROLOL TARTRATE 12.5 MG HALF TABLET
12.5000 mg | ORAL_TABLET | Freq: Once | ORAL | Status: DC
  Filled 2017-03-13: qty 1

## 2017-03-13 NOTE — Anesthesia Preprocedure Evaluation (Addendum)
Anesthesia Evaluation  Patient identified by MRN, date of birth, ID band Patient awake    Reviewed: Allergy & Precautions, NPO status , Patient's Chart, lab work & pertinent test results  History of Anesthesia Complications Negative for: history of anesthetic complications  Airway Mallampati: II  TM Distance: <3 FB Neck ROM: Full    Dental no notable dental hx.    Pulmonary sleep apnea , former smoker,    breath sounds clear to auscultation       Cardiovascular hypertension, + angina + CAD and +CHF  + Valvular Problems/Murmurs AS  Rhythm:Regular Rate:Normal + Systolic murmurs    Neuro/Psych  Neuromuscular disease    GI/Hepatic Neg liver ROS, GERD  ,  Endo/Other  negative endocrine ROS  Renal/GU negative Renal ROS     Musculoskeletal   Abdominal (+) + obese,   Peds  Hematology negative hematology ROS (+)   Anesthesia Other Findings   Reproductive/Obstetrics                            Anesthesia Physical Anesthesia Plan  ASA: IV  Anesthesia Plan: General   Post-op Pain Management:    Induction: Intravenous  PONV Risk Score and Plan: 3 and Ondansetron, Dexamethasone, Midazolam and Propofol infusion  Airway Management Planned: Oral ETT  Additional Equipment: Arterial line, PA Cath, TEE and Ultrasound Guidance Line Placement  Intra-op Plan:   Post-operative Plan: Post-operative intubation/ventilation  Informed Consent: I have reviewed the patients History and Physical, chart, labs and discussed the procedure including the risks, benefits and alternatives for the proposed anesthesia with the patient or authorized representative who has indicated his/her understanding and acceptance.     Plan Discussed with: CRNA  Anesthesia Plan Comments:         Anesthesia Quick Evaluation

## 2017-03-14 ENCOUNTER — Encounter (HOSPITAL_COMMUNITY)
Admission: RE | Disposition: A | Discharge status: Home / Self Care | Payer: Self-pay | Source: Ambulatory Visit | Attending: Cardiothoracic Surgery

## 2017-03-14 ENCOUNTER — Encounter (HOSPITAL_COMMUNITY): Payer: BLUE CROSS/BLUE SHIELD

## 2017-03-14 ENCOUNTER — Inpatient Hospital Stay (HOSPITAL_COMMUNITY): Payer: BLUE CROSS/BLUE SHIELD | Admitting: Vascular Surgery

## 2017-03-14 ENCOUNTER — Inpatient Hospital Stay (HOSPITAL_COMMUNITY): Payer: BLUE CROSS/BLUE SHIELD

## 2017-03-14 ENCOUNTER — Inpatient Hospital Stay (HOSPITAL_COMMUNITY): Payer: BLUE CROSS/BLUE SHIELD | Admitting: Anesthesiology

## 2017-03-14 ENCOUNTER — Encounter (HOSPITAL_COMMUNITY): Payer: Self-pay | Admitting: Urology

## 2017-03-14 ENCOUNTER — Inpatient Hospital Stay (HOSPITAL_COMMUNITY)
Admission: RE | Admit: 2017-03-14 | Discharge: 2017-03-18 | DRG: 220 | Disposition: A | Discharge status: Home / Self Care | Payer: BLUE CROSS/BLUE SHIELD | Source: Ambulatory Visit | Attending: Cardiothoracic Surgery | Admitting: Cardiothoracic Surgery

## 2017-03-14 DIAGNOSIS — D62 Acute posthemorrhagic anemia: Secondary | ICD-10-CM | POA: Diagnosis not present

## 2017-03-14 DIAGNOSIS — I110 Hypertensive heart disease with heart failure: Secondary | ICD-10-CM | POA: Diagnosis not present

## 2017-03-14 DIAGNOSIS — E785 Hyperlipidemia, unspecified: Secondary | ICD-10-CM | POA: Diagnosis present

## 2017-03-14 DIAGNOSIS — I712 Thoracic aortic aneurysm, without rupture, unspecified: Secondary | ICD-10-CM

## 2017-03-14 DIAGNOSIS — Z955 Presence of coronary angioplasty implant and graft: Secondary | ICD-10-CM

## 2017-03-14 DIAGNOSIS — Q231 Congenital insufficiency of aortic valve: Principal | ICD-10-CM

## 2017-03-14 DIAGNOSIS — Z8249 Family history of ischemic heart disease and other diseases of the circulatory system: Secondary | ICD-10-CM | POA: Diagnosis not present

## 2017-03-14 DIAGNOSIS — E876 Hypokalemia: Secondary | ICD-10-CM | POA: Diagnosis not present

## 2017-03-14 DIAGNOSIS — I255 Ischemic cardiomyopathy: Secondary | ICD-10-CM | POA: Diagnosis present

## 2017-03-14 DIAGNOSIS — Z888 Allergy status to other drugs, medicaments and biological substances status: Secondary | ICD-10-CM | POA: Diagnosis not present

## 2017-03-14 DIAGNOSIS — G4730 Sleep apnea, unspecified: Secondary | ICD-10-CM | POA: Diagnosis present

## 2017-03-14 DIAGNOSIS — I2510 Atherosclerotic heart disease of native coronary artery without angina pectoris: Secondary | ICD-10-CM | POA: Diagnosis present

## 2017-03-14 DIAGNOSIS — I4891 Unspecified atrial fibrillation: Secondary | ICD-10-CM | POA: Diagnosis not present

## 2017-03-14 DIAGNOSIS — I5022 Chronic systolic (congestive) heart failure: Secondary | ICD-10-CM | POA: Diagnosis not present

## 2017-03-14 DIAGNOSIS — I350 Nonrheumatic aortic (valve) stenosis: Secondary | ICD-10-CM | POA: Diagnosis not present

## 2017-03-14 DIAGNOSIS — K219 Gastro-esophageal reflux disease without esophagitis: Secondary | ICD-10-CM | POA: Diagnosis present

## 2017-03-14 DIAGNOSIS — Z880 Allergy status to penicillin: Secondary | ICD-10-CM

## 2017-03-14 DIAGNOSIS — I351 Nonrheumatic aortic (valve) insufficiency: Secondary | ICD-10-CM

## 2017-03-14 DIAGNOSIS — Z87891 Personal history of nicotine dependence: Secondary | ICD-10-CM

## 2017-03-14 DIAGNOSIS — D696 Thrombocytopenia, unspecified: Secondary | ICD-10-CM | POA: Diagnosis not present

## 2017-03-14 DIAGNOSIS — I352 Nonrheumatic aortic (valve) stenosis with insufficiency: Secondary | ICD-10-CM | POA: Diagnosis present

## 2017-03-14 DIAGNOSIS — I7101 Dissection of thoracic aorta: Secondary | ICD-10-CM

## 2017-03-14 DIAGNOSIS — Z09 Encounter for follow-up examination after completed treatment for conditions other than malignant neoplasm: Secondary | ICD-10-CM

## 2017-03-14 DIAGNOSIS — Z952 Presence of prosthetic heart valve: Secondary | ICD-10-CM

## 2017-03-14 HISTORY — PX: AORTIC VALVE REPLACEMENT: SHX41

## 2017-03-14 HISTORY — PX: TEE WITHOUT CARDIOVERSION: SHX5443

## 2017-03-14 HISTORY — PX: REPLACEMENT ASCENDING AORTA: SHX6068

## 2017-03-14 HISTORY — PX: CORONARY ARTERY BYPASS GRAFT: SHX141

## 2017-03-14 LAB — POCT I-STAT 3, ART BLOOD GAS (G3+)
Acid-Base Excess: 5 mmol/L — ABNORMAL HIGH (ref 0.0–2.0)
Acid-Base Excess: 1 mmol/L (ref 0.0–2.0)
Acid-base deficit: 1 mmol/L (ref 0.0–2.0)
Acid-base deficit: 2 mmol/L (ref 0.0–2.0)
Bicarbonate: 30.1 mmol/L — ABNORMAL HIGH (ref 20.0–28.0)
Bicarbonate: 25.9 mmol/L (ref 20.0–28.0)
Bicarbonate: 23.2 mmol/L (ref 20.0–28.0)
Bicarbonate: 22.9 mmol/L (ref 20.0–28.0)
O2 Saturation: 100 %
O2 Saturation: 97 %
O2 Saturation: 100 %
O2 Saturation: 97 %
Patient temperature: 36.2
Patient temperature: 36.6
Patient temperature: 36.4
TCO2: 32 mmol/L (ref 22–32)
TCO2: 27 mmol/L (ref 22–32)
TCO2: 24 mmol/L (ref 22–32)
TCO2: 24 mmol/L (ref 22–32)
pCO2 arterial: 49.4 mmHg — ABNORMAL HIGH (ref 32.0–48.0)
pCO2 arterial: 42.4 mmHg (ref 32.0–48.0)
pCO2 arterial: 34.9 mmHg (ref 32.0–48.0)
pCO2 arterial: 38.7 mmHg (ref 32.0–48.0)
pH, Arterial: 7.393 (ref 7.350–7.450)
pH, Arterial: 7.39 (ref 7.350–7.450)
pH, Arterial: 7.429 (ref 7.350–7.450)
pH, Arterial: 7.378 (ref 7.350–7.450)
pO2, Arterial: 347 mmHg — ABNORMAL HIGH (ref 83.0–108.0)
pO2, Arterial: 88 mmHg (ref 83.0–108.0)
pO2, Arterial: 244 mmHg — ABNORMAL HIGH (ref 83.0–108.0)
pO2, Arterial: 92 mmHg (ref 83.0–108.0)

## 2017-03-14 LAB — POCT I-STAT, CHEM 8
BUN: 19 mg/dL (ref 6–20)
BUN: 17 mg/dL (ref 6–20)
BUN: 19 mg/dL (ref 6–20)
BUN: 17 mg/dL (ref 6–20)
BUN: 18 mg/dL (ref 6–20)
BUN: 18 mg/dL (ref 6–20)
BUN: 20 mg/dL (ref 6–20)
Calcium, Ion: 1.28 mmol/L (ref 1.15–1.40)
Calcium, Ion: 1.07 mmol/L — ABNORMAL LOW (ref 1.15–1.40)
Calcium, Ion: 1.27 mmol/L (ref 1.15–1.40)
Calcium, Ion: 1.16 mmol/L (ref 1.15–1.40)
Calcium, Ion: 1.17 mmol/L (ref 1.15–1.40)
Calcium, Ion: 1.13 mmol/L — ABNORMAL LOW (ref 1.15–1.40)
Calcium, Ion: 1.19 mmol/L (ref 1.15–1.40)
Chloride: 103 mmol/L (ref 101–111)
Chloride: 103 mmol/L (ref 101–111)
Chloride: 97 mmol/L — ABNORMAL LOW (ref 101–111)
Chloride: 101 mmol/L (ref 101–111)
Chloride: 102 mmol/L (ref 101–111)
Chloride: 102 mmol/L (ref 101–111)
Chloride: 104 mmol/L (ref 101–111)
Creatinine, Ser: 0.8 mg/dL (ref 0.61–1.24)
Creatinine, Ser: 0.7 mg/dL (ref 0.61–1.24)
Creatinine, Ser: 0.8 mg/dL (ref 0.61–1.24)
Creatinine, Ser: 0.7 mg/dL (ref 0.61–1.24)
Creatinine, Ser: 0.7 mg/dL (ref 0.61–1.24)
Creatinine, Ser: 0.8 mg/dL (ref 0.61–1.24)
Creatinine, Ser: 0.8 mg/dL (ref 0.61–1.24)
Glucose, Bld: 100 mg/dL — ABNORMAL HIGH (ref 65–99)
Glucose, Bld: 107 mg/dL — ABNORMAL HIGH (ref 65–99)
Glucose, Bld: 92 mg/dL (ref 65–99)
Glucose, Bld: 174 mg/dL — ABNORMAL HIGH (ref 65–99)
Glucose, Bld: 179 mg/dL — ABNORMAL HIGH (ref 65–99)
Glucose, Bld: 153 mg/dL — ABNORMAL HIGH (ref 65–99)
Glucose, Bld: 135 mg/dL — ABNORMAL HIGH (ref 65–99)
HCT: 36 % — ABNORMAL LOW (ref 39.0–52.0)
HCT: 28 % — ABNORMAL LOW (ref 39.0–52.0)
HCT: 34 % — ABNORMAL LOW (ref 39.0–52.0)
HCT: 31 % — ABNORMAL LOW (ref 39.0–52.0)
HCT: 31 % — ABNORMAL LOW (ref 39.0–52.0)
HCT: 26 % — ABNORMAL LOW (ref 39.0–52.0)
HCT: 33 % — ABNORMAL LOW (ref 39.0–52.0)
Hemoglobin: 12.2 g/dL — ABNORMAL LOW (ref 13.0–17.0)
Hemoglobin: 11.6 g/dL — ABNORMAL LOW (ref 13.0–17.0)
Hemoglobin: 9.5 g/dL — ABNORMAL LOW (ref 13.0–17.0)
Hemoglobin: 10.5 g/dL — ABNORMAL LOW (ref 13.0–17.0)
Hemoglobin: 10.5 g/dL — ABNORMAL LOW (ref 13.0–17.0)
Hemoglobin: 8.8 g/dL — ABNORMAL LOW (ref 13.0–17.0)
Hemoglobin: 11.2 g/dL — ABNORMAL LOW (ref 13.0–17.0)
Potassium: 4 mmol/L (ref 3.5–5.1)
Potassium: 3.9 mmol/L (ref 3.5–5.1)
Potassium: 4.1 mmol/L (ref 3.5–5.1)
Potassium: 4.9 mmol/L (ref 3.5–5.1)
Potassium: 5.3 mmol/L — ABNORMAL HIGH (ref 3.5–5.1)
Potassium: 4.2 mmol/L (ref 3.5–5.1)
Potassium: 4.2 mmol/L (ref 3.5–5.1)
Sodium: 139 mmol/L (ref 135–145)
Sodium: 137 mmol/L (ref 135–145)
Sodium: 138 mmol/L (ref 135–145)
Sodium: 135 mmol/L (ref 135–145)
Sodium: 136 mmol/L (ref 135–145)
Sodium: 136 mmol/L (ref 135–145)
Sodium: 138 mmol/L (ref 135–145)
TCO2: 26 mmol/L (ref 22–32)
TCO2: 27 mmol/L (ref 22–32)
TCO2: 27 mmol/L (ref 22–32)
TCO2: 26 mmol/L (ref 22–32)
TCO2: 27 mmol/L (ref 22–32)
TCO2: 23 mmol/L (ref 22–32)
TCO2: 24 mmol/L (ref 22–32)

## 2017-03-14 LAB — GLUCOSE, CAPILLARY
Glucose-Capillary: 89 mg/dL (ref 65–99)
Glucose-Capillary: 124 mg/dL — ABNORMAL HIGH (ref 65–99)
Glucose-Capillary: 117 mg/dL — ABNORMAL HIGH (ref 65–99)
Glucose-Capillary: 110 mg/dL — ABNORMAL HIGH (ref 65–99)
Glucose-Capillary: 138 mg/dL — ABNORMAL HIGH (ref 65–99)
Glucose-Capillary: 130 mg/dL — ABNORMAL HIGH (ref 65–99)
Glucose-Capillary: 144 mg/dL — ABNORMAL HIGH (ref 65–99)
Glucose-Capillary: 137 mg/dL — ABNORMAL HIGH (ref 65–99)

## 2017-03-14 LAB — HEMOGLOBIN AND HEMATOCRIT, BLOOD
HCT: 31.8 % — ABNORMAL LOW (ref 39.0–52.0)
Hemoglobin: 11 g/dL — ABNORMAL LOW (ref 13.0–17.0)

## 2017-03-14 LAB — CBC
HCT: 36.4 % — ABNORMAL LOW (ref 39.0–52.0)
HCT: 33.7 % — ABNORMAL LOW (ref 39.0–52.0)
Hemoglobin: 13.2 g/dL (ref 13.0–17.0)
Hemoglobin: 12.3 g/dL — ABNORMAL LOW (ref 13.0–17.0)
MCH: 32.6 pg (ref 26.0–34.0)
MCH: 32.5 pg (ref 26.0–34.0)
MCHC: 36.3 g/dL — AB (ref 30.0–36.0)
MCHC: 36.5 g/dL — ABNORMAL HIGH (ref 30.0–36.0)
MCV: 89.9 fL (ref 78.0–100.0)
MCV: 89.2 fL (ref 78.0–100.0)
Platelets: 135 10*3/uL — ABNORMAL LOW (ref 150–400)
Platelets: 131 10*3/uL — ABNORMAL LOW (ref 150–400)
RBC: 4.05 MIL/uL — ABNORMAL LOW (ref 4.22–5.81)
RBC: 3.78 MIL/uL — ABNORMAL LOW (ref 4.22–5.81)
RDW: 12.1 % (ref 11.5–15.5)
RDW: 12 % (ref 11.5–15.5)
WBC: 15.2 10*3/uL — ABNORMAL HIGH (ref 4.0–10.5)
WBC: 12.5 10*3/uL — ABNORMAL HIGH (ref 4.0–10.5)

## 2017-03-14 LAB — CREATININE, SERUM
Creatinine, Ser: 0.88 mg/dL (ref 0.61–1.24)
GFR calc Af Amer: >60 mL/min (ref >60)
GFR calc non Af Amer: >60 mL/min (ref >60)

## 2017-03-14 LAB — PLATELET COUNT: Platelets: 131 10*3/uL — ABNORMAL LOW (ref 150–400)

## 2017-03-14 LAB — PROTIME-INR
INR: 1.38
PROTHROMBIN TIME: 16.9 s — AB (ref 11.4–15.2)

## 2017-03-14 LAB — MAGNESIUM: Magnesium: 2.6 mg/dL — ABNORMAL HIGH (ref 1.7–2.4)

## 2017-03-14 LAB — FIBRINOGEN: Fibrinogen: 238 mg/dL (ref 210–475)

## 2017-03-14 LAB — APTT: aPTT: 30 seconds (ref 24–36)

## 2017-03-14 SURGERY — REPLACEMENT, AORTA, ASCENDING
Anesthesia: General | Site: Chest

## 2017-03-14 MED ORDER — LACTATED RINGERS IV SOLN: INTRAVENOUS | Status: DC

## 2017-03-14 MED ORDER — ROCURONIUM BROMIDE 10 MG/ML (PF) SYRINGE
PREFILLED_SYRINGE | INTRAVENOUS | Status: AC
  Filled 2017-03-14: qty 15

## 2017-03-14 MED ORDER — FENTANYL CITRATE (PF) 250 MCG/5ML IJ SOLN
INTRAMUSCULAR | Status: DC | PRN
  Administered 2017-03-14 (×2): 250 ug via INTRAVENOUS
  Administered 2017-03-14: 150 ug via INTRAVENOUS
  Administered 2017-03-14: 100 ug via INTRAVENOUS
  Administered 2017-03-14 (×2): 250 ug via INTRAVENOUS
  Administered 2017-03-14: 100 ug via INTRAVENOUS

## 2017-03-14 MED ORDER — INSULIN REGULAR BOLUS VIA INFUSION
0.0000 [IU] | Freq: Three times a day (TID) | INTRAVENOUS | Status: DC
  Administered 2017-03-15 (×2): 2 [IU] via INTRAVENOUS
  Filled 2017-03-14: qty 10

## 2017-03-14 MED ORDER — HEMOSTATIC AGENTS (NO CHARGE) OPTIME
TOPICAL | Status: DC | PRN
  Administered 2017-03-14 (×3): 1 via TOPICAL

## 2017-03-14 MED ORDER — ALBUMIN HUMAN 5 % IV SOLN
250.0000 mL | INTRAVENOUS | Status: AC | PRN
  Administered 2017-03-14 (×3): 250 mL via INTRAVENOUS
  Filled 2017-03-14: qty 250

## 2017-03-14 MED ORDER — MAGNESIUM OXIDE 400 (241.3 MG) MG PO TABS
400.0000 mg | ORAL_TABLET | Freq: Two times a day (BID) | ORAL | Status: DC
  Administered 2017-03-15 – 2017-03-18 (×7): 400 mg via ORAL
  Filled 2017-03-14 (×7): qty 1

## 2017-03-14 MED ORDER — FAMOTIDINE IN NACL 20-0.9 MG/50ML-% IV SOLN
20.0000 mg | Freq: Two times a day (BID) | INTRAVENOUS | Status: AC
  Administered 2017-03-14: 20 mg via INTRAVENOUS

## 2017-03-14 MED ORDER — ACETAMINOPHEN 160 MG/5ML PO SOLN: 1000.0000 mg | Freq: Four times a day (QID) | ORAL | Status: DC

## 2017-03-14 MED ORDER — ORAL CARE MOUTH RINSE
15.0000 mL | Freq: Two times a day (BID) | OROMUCOSAL | Status: DC
Start: 2017-03-14 — End: 2017-03-15
  Administered 2017-03-15: 15 mL via OROMUCOSAL

## 2017-03-14 MED ORDER — AMIODARONE HCL IN DEXTROSE 360-4.14 MG/200ML-% IV SOLN
INTRAVENOUS | Status: DC | PRN
  Administered 2017-03-14: 60 mg/h via INTRAVENOUS

## 2017-03-14 MED ORDER — METOPROLOL TARTRATE 5 MG/5ML IV SOLN: 2.5000 mg | INTRAVENOUS | Status: DC | PRN

## 2017-03-14 MED ORDER — ORAL CARE MOUTH RINSE: 15.0000 mL | Freq: Four times a day (QID) | OROMUCOSAL | Status: DC

## 2017-03-14 MED ORDER — NITROGLYCERIN IN D5W 200-5 MCG/ML-% IV SOLN: 0.0000 ug/min | INTRAVENOUS | Status: DC

## 2017-03-14 MED ORDER — ONDANSETRON HCL 4 MG/2ML IJ SOLN: 4.0000 mg | Freq: Four times a day (QID) | INTRAMUSCULAR | Status: DC | PRN

## 2017-03-14 MED ORDER — PROPOFOL 10 MG/ML IV BOLUS
INTRAVENOUS | Status: DC | PRN
  Administered 2017-03-14: 50 mg via INTRAVENOUS
  Administered 2017-03-14: 70 mg via INTRAVENOUS

## 2017-03-14 MED ORDER — FLUTICASONE PROPIONATE 50 MCG/ACT NA SUSP
2.0000 | Freq: Every day | NASAL | Status: DC | PRN
  Filled 2017-03-14: qty 16

## 2017-03-14 MED ORDER — VANCOMYCIN HCL IN DEXTROSE 1-5 GM/200ML-% IV SOLN
1000.0000 mg | Freq: Once | INTRAVENOUS | Status: AC
  Administered 2017-03-14: 1000 mg via INTRAVENOUS
  Filled 2017-03-14: qty 200

## 2017-03-14 MED ORDER — MORPHINE SULFATE (PF) 4 MG/ML IV SOLN
2.0000 mg | INTRAVENOUS | Status: DC | PRN
  Administered 2017-03-14 – 2017-03-15 (×4): 2 mg via INTRAVENOUS
  Administered 2017-03-15: 3 mg via INTRAVENOUS
  Administered 2017-03-15: 2 mg via INTRAVENOUS
  Filled 2017-03-14 (×5): qty 1

## 2017-03-14 MED ORDER — DOPAMINE-DEXTROSE 3.2-5 MG/ML-% IV SOLN: 3.0000 ug/kg/min | INTRAVENOUS | Status: DC

## 2017-03-14 MED ORDER — RISAQUAD PO CAPS
1.0000 | ORAL_CAPSULE | Freq: Every day | ORAL | Status: DC
  Administered 2017-03-15 – 2017-03-18 (×4): 1 via ORAL
  Filled 2017-03-14 (×4): qty 1

## 2017-03-14 MED ORDER — CHLORHEXIDINE GLUCONATE 0.12 % MT SOLN
15.0000 mL | Freq: Once | OROMUCOSAL | Status: AC
  Administered 2017-03-14: 15 mL via OROMUCOSAL
  Filled 2017-03-14: qty 15

## 2017-03-14 MED ORDER — MILRINONE LACTATE IN DEXTROSE 20-5 MG/100ML-% IV SOLN
0.1500 ug/kg/min | INTRAVENOUS | Status: DC
  Administered 2017-03-14 – 2017-03-15 (×2): 0.3 ug/kg/min via INTRAVENOUS
  Administered 2017-03-16: 0.15 ug/kg/min via INTRAVENOUS
  Filled 2017-03-14 (×3): qty 100

## 2017-03-14 MED ORDER — CHLORHEXIDINE GLUCONATE 4 % EX LIQD: 30.0000 mL | CUTANEOUS | Status: DC

## 2017-03-14 MED ORDER — OXYCODONE HCL 5 MG PO TABS
5.0000 mg | ORAL_TABLET | ORAL | Status: DC | PRN
  Administered 2017-03-15 – 2017-03-16 (×4): 10 mg via ORAL
  Filled 2017-03-14 (×4): qty 2

## 2017-03-14 MED ORDER — PROPOFOL 10 MG/ML IV BOLUS
INTRAVENOUS | Status: AC
  Filled 2017-03-14: qty 20

## 2017-03-14 MED ORDER — MIDAZOLAM HCL 10 MG/2ML IJ SOLN
INTRAMUSCULAR | Status: AC
  Filled 2017-03-14: qty 2

## 2017-03-14 MED ORDER — ACETAMINOPHEN 650 MG RE SUPP
650.0000 mg | Freq: Once | RECTAL | Status: AC
  Administered 2017-03-14: 650 mg via RECTAL

## 2017-03-14 MED ORDER — MILRINONE LACTATE IN DEXTROSE 20-5 MG/100ML-% IV SOLN
0.1250 ug/kg/min | Freq: Once | INTRAVENOUS | Status: AC
Start: 2017-03-14 — End: 2017-03-14
  Administered 2017-03-14: 0.25 ug/kg/min via INTRAVENOUS
  Filled 2017-03-14: qty 100

## 2017-03-14 MED ORDER — PHENYLEPHRINE HCL 10 MG/ML IJ SOLN
INTRAMUSCULAR | Status: DC | PRN
  Administered 2017-03-14: 25 ug/min via INTRAVENOUS

## 2017-03-14 MED ORDER — SODIUM CHLORIDE 0.9 % IV SOLN: INTRAVENOUS | Status: DC

## 2017-03-14 MED ORDER — CALCIUM CARBONATE-VITAMIN D 500-200 MG-UNIT PO TABS
1.0000 | ORAL_TABLET | Freq: Every day | ORAL | Status: DC
  Administered 2017-03-15 – 2017-03-18 (×4): 1 via ORAL
  Filled 2017-03-14 (×4): qty 1

## 2017-03-14 MED ORDER — AMIODARONE IV BOLUS ONLY 150 MG/100ML
INTRAVENOUS | Status: DC | PRN
  Administered 2017-03-14: 150 mg via INTRAVENOUS

## 2017-03-14 MED ORDER — PROTAMINE SULFATE 10 MG/ML IV SOLN
INTRAVENOUS | Status: AC
  Filled 2017-03-14: qty 25

## 2017-03-14 MED ORDER — LACTATED RINGERS IV SOLN: 500.0000 mL | Freq: Once | INTRAVENOUS | Status: DC | PRN

## 2017-03-14 MED ORDER — MAGNESIUM SULFATE 4 GM/100ML IV SOLN
4.0000 g | Freq: Once | INTRAVENOUS | Status: AC
  Administered 2017-03-14: 4 g via INTRAVENOUS
  Filled 2017-03-14: qty 100

## 2017-03-14 MED ORDER — ROCURONIUM BROMIDE 100 MG/10ML IV SOLN
INTRAVENOUS | Status: DC | PRN
  Administered 2017-03-14: 50 mg via INTRAVENOUS
  Administered 2017-03-14: 100 mg via INTRAVENOUS
  Administered 2017-03-14 (×4): 50 mg via INTRAVENOUS

## 2017-03-14 MED ORDER — LACTATED RINGERS IV SOLN
INTRAVENOUS | Status: DC | PRN
  Administered 2017-03-14 (×5): via INTRAVENOUS

## 2017-03-14 MED ORDER — ACETAMINOPHEN 160 MG/5ML PO SOLN: 650.0000 mg | Freq: Once | ORAL | Status: AC

## 2017-03-14 MED ORDER — ACETAMINOPHEN 500 MG PO TABS
1000.0000 mg | ORAL_TABLET | Freq: Four times a day (QID) | ORAL | Status: DC
  Administered 2017-03-15 – 2017-03-16 (×4): 1000 mg via ORAL
  Filled 2017-03-14 (×5): qty 2

## 2017-03-14 MED ORDER — CALCIUM CHLORIDE 10 % IV SOLN
INTRAVENOUS | Status: DC | PRN
  Administered 2017-03-14: 100 mg via INTRAVENOUS

## 2017-03-14 MED ORDER — MORPHINE SULFATE (PF) 4 MG/ML IV SOLN: 1.0000 mg | INTRAVENOUS | Status: AC | PRN

## 2017-03-14 MED ORDER — ROSUVASTATIN CALCIUM 10 MG PO TABS
10.0000 mg | ORAL_TABLET | Freq: Every day | ORAL | Status: DC
  Administered 2017-03-15 – 2017-03-18 (×4): 10 mg via ORAL
  Filled 2017-03-14 (×4): qty 1

## 2017-03-14 MED ORDER — ALBUMIN HUMAN 5 % IV SOLN
INTRAVENOUS | Status: DC | PRN
  Administered 2017-03-14: 15:00:00 via INTRAVENOUS

## 2017-03-14 MED ORDER — SODIUM CHLORIDE 0.45 % IV SOLN: INTRAVENOUS | Status: DC | PRN

## 2017-03-14 MED ORDER — PANTOPRAZOLE SODIUM 40 MG PO TBEC: 40.0000 mg | DELAYED_RELEASE_TABLET | Freq: Every day | ORAL | Status: DC

## 2017-03-14 MED ORDER — LEVOFLOXACIN IN D5W 500 MG/100ML IV SOLN
500.0000 mg | INTRAVENOUS | Status: DC
  Filled 2017-03-14 (×2): qty 100

## 2017-03-14 MED ORDER — FENTANYL CITRATE (PF) 250 MCG/5ML IJ SOLN
INTRAMUSCULAR | Status: AC
Start: 2017-03-14 — End: ?
  Filled 2017-03-14: qty 20

## 2017-03-14 MED ORDER — SODIUM CHLORIDE 0.9 % IV SOLN
INTRAVENOUS | Status: DC
  Filled 2017-03-14: qty 1

## 2017-03-14 MED ORDER — MIDAZOLAM HCL 2 MG/2ML IJ SOLN
2.0000 mg | INTRAMUSCULAR | Status: DC | PRN
Start: 2017-03-14 — End: 2017-03-16

## 2017-03-14 MED ORDER — MAGNESIUM 200 MG PO TABS
400.0000 mg | ORAL_TABLET | Freq: Two times a day (BID) | ORAL | Status: DC
  Filled 2017-03-14: qty 2

## 2017-03-14 MED ORDER — FENTANYL CITRATE (PF) 100 MCG/2ML IJ SOLN
INTRAMUSCULAR | Status: AC
  Filled 2017-03-14: qty 2

## 2017-03-14 MED ORDER — GLYCOPYRROLATE 0.2 MG/ML IJ SOLN
INTRAMUSCULAR | Status: DC | PRN
  Administered 2017-03-14 (×2): 0.2 mg via INTRAVENOUS

## 2017-03-14 MED ORDER — SODIUM CHLORIDE 0.9% FLUSH
3.0000 mL | Freq: Two times a day (BID) | INTRAVENOUS | Status: DC
  Administered 2017-03-15: 3 mL via INTRAVENOUS

## 2017-03-14 MED ORDER — BISACODYL 10 MG RE SUPP: 10.0000 mg | Freq: Every day | RECTAL | Status: DC

## 2017-03-14 MED ORDER — SODIUM CHLORIDE 0.9 % IV SOLN
0.0000 ug/kg/h | INTRAVENOUS | Status: DC
  Filled 2017-03-14: qty 2

## 2017-03-14 MED ORDER — TRAMADOL HCL 50 MG PO TABS
50.0000 mg | ORAL_TABLET | ORAL | Status: DC | PRN
  Administered 2017-03-15: 50 mg via ORAL
  Administered 2017-03-16: 100 mg via ORAL
  Filled 2017-03-14: qty 1
  Filled 2017-03-14: qty 2

## 2017-03-14 MED ORDER — SODIUM CHLORIDE 0.9% FLUSH: 3.0000 mL | INTRAVENOUS | Status: DC | PRN

## 2017-03-14 MED ORDER — AMIODARONE HCL IN DEXTROSE 360-4.14 MG/200ML-% IV SOLN
INTRAVENOUS | Status: AC
  Filled 2017-03-14: qty 200

## 2017-03-14 MED ORDER — SODIUM CHLORIDE 0.9 % IJ SOLN
OROMUCOSAL | Status: DC | PRN
  Administered 2017-03-14 (×4): 4 mL via TOPICAL

## 2017-03-14 MED ORDER — MIDAZOLAM HCL 5 MG/ML IJ SOLN
INTRAMUSCULAR | Status: DC | PRN
  Administered 2017-03-14: 2 mg via INTRAVENOUS
  Administered 2017-03-14: 3 mg via INTRAVENOUS
  Administered 2017-03-14: 5 mg via INTRAVENOUS

## 2017-03-14 MED ORDER — BISACODYL 5 MG PO TBEC
10.0000 mg | DELAYED_RELEASE_TABLET | Freq: Every day | ORAL | Status: DC
  Administered 2017-03-15: 10 mg via ORAL
  Filled 2017-03-14: qty 2

## 2017-03-14 MED ORDER — ASPIRIN EC 325 MG PO TBEC
325.0000 mg | DELAYED_RELEASE_TABLET | Freq: Every day | ORAL | Status: DC
  Administered 2017-03-15: 325 mg via ORAL
  Filled 2017-03-14: qty 1

## 2017-03-14 MED ORDER — POTASSIUM CHLORIDE 10 MEQ/50ML IV SOLN: 10.0000 meq | INTRAVENOUS | Status: AC

## 2017-03-14 MED ORDER — DOCUSATE SODIUM 100 MG PO CAPS
200.0000 mg | ORAL_CAPSULE | Freq: Every day | ORAL | Status: DC
  Administered 2017-03-15: 200 mg via ORAL
  Filled 2017-03-14: qty 2

## 2017-03-14 MED ORDER — PNEUMOCOCCAL VAC POLYVALENT 25 MCG/0.5ML IJ INJ: 0.5000 mL | INJECTION | INTRAMUSCULAR | Status: DC | PRN

## 2017-03-14 MED ORDER — 0.9 % SODIUM CHLORIDE (POUR BTL) OPTIME
TOPICAL | Status: DC | PRN
  Administered 2017-03-14: 6000 mL

## 2017-03-14 MED ORDER — METOPROLOL TARTRATE 25 MG/10 ML ORAL SUSPENSION: 12.5000 mg | Freq: Two times a day (BID) | ORAL | Status: DC

## 2017-03-14 MED ORDER — MIDAZOLAM HCL 2 MG/2ML IJ SOLN
INTRAMUSCULAR | Status: AC
  Filled 2017-03-14: qty 2

## 2017-03-14 MED ORDER — ASPIRIN 81 MG PO CHEW: 324.0000 mg | CHEWABLE_TABLET | Freq: Every day | ORAL | Status: DC

## 2017-03-14 MED ORDER — ORAL CARE MOUTH RINSE: 15.0000 mL | Freq: Two times a day (BID) | OROMUCOSAL | Status: DC

## 2017-03-14 MED ORDER — MIDAZOLAM HCL 5 MG/5ML IJ SOLN
INTRAMUSCULAR | Status: DC | PRN
  Administered 2017-03-14: 2 mg via INTRAVENOUS

## 2017-03-14 MED ORDER — FENTANYL CITRATE (PF) 250 MCG/5ML IJ SOLN
INTRAMUSCULAR | Status: AC
  Filled 2017-03-14: qty 5

## 2017-03-14 MED ORDER — HEPARIN SODIUM (PORCINE) 1000 UNIT/ML IJ SOLN
INTRAMUSCULAR | Status: DC | PRN
  Administered 2017-03-14: 33000 [IU] via INTRAVENOUS

## 2017-03-14 MED ORDER — METOPROLOL TARTRATE 12.5 MG HALF TABLET
12.5000 mg | ORAL_TABLET | Freq: Two times a day (BID) | ORAL | Status: DC
  Administered 2017-03-15 (×2): 12.5 mg via ORAL
  Filled 2017-03-14 (×2): qty 1

## 2017-03-14 MED ORDER — HEPARIN SODIUM (PORCINE) 1000 UNIT/ML IJ SOLN
INTRAMUSCULAR | Status: AC
  Filled 2017-03-14: qty 1

## 2017-03-14 MED ORDER — SODIUM CHLORIDE 0.9 % IV SOLN: 250.0000 mL | INTRAVENOUS | Status: DC

## 2017-03-14 MED ORDER — PROTAMINE SULFATE 10 MG/ML IV SOLN
INTRAVENOUS | Status: DC | PRN
  Administered 2017-03-14: 50 mg via INTRAVENOUS
  Administered 2017-03-14: 40 mg via INTRAVENOUS
  Administered 2017-03-14 (×3): 50 mg via INTRAVENOUS
  Administered 2017-03-14: 10 mg via INTRAVENOUS
  Administered 2017-03-14: 50 mg via INTRAVENOUS

## 2017-03-14 MED ORDER — LEVOFLOXACIN IN D5W 750 MG/150ML IV SOLN
750.0000 mg | INTRAVENOUS | Status: AC
  Administered 2017-03-15: 750 mg via INTRAVENOUS
  Filled 2017-03-14: qty 150

## 2017-03-14 MED ORDER — PROTAMINE SULFATE 10 MG/ML IV SOLN
INTRAVENOUS | Status: AC
  Filled 2017-03-14: qty 5

## 2017-03-14 MED ORDER — ROCURONIUM BROMIDE 10 MG/ML (PF) SYRINGE
PREFILLED_SYRINGE | INTRAVENOUS | Status: AC
Start: 2017-03-14 — End: ?
  Filled 2017-03-14: qty 5

## 2017-03-14 MED ORDER — CHLORHEXIDINE GLUCONATE 0.12% ORAL RINSE (MEDLINE KIT)
15.0000 mL | Freq: Two times a day (BID) | OROMUCOSAL | Status: DC
  Administered 2017-03-14 – 2017-03-15 (×2): 15 mL via OROMUCOSAL

## 2017-03-14 MED ORDER — PHENYLEPHRINE HCL 10 MG/ML IJ SOLN
0.0000 ug/min | INTRAMUSCULAR | Status: DC
  Administered 2017-03-14: 30 ug/min via INTRAVENOUS
  Filled 2017-03-14: qty 2

## 2017-03-14 MED ORDER — CHLORHEXIDINE GLUCONATE 0.12 % MT SOLN
15.0000 mL | OROMUCOSAL | Status: AC
  Administered 2017-03-14: 15 mL via OROMUCOSAL

## 2017-03-14 MED ORDER — METOCLOPRAMIDE HCL 5 MG/ML IJ SOLN
10.0000 mg | Freq: Four times a day (QID) | INTRAMUSCULAR | Status: AC
  Administered 2017-03-14 – 2017-03-15 (×3): 10 mg via INTRAVENOUS
  Filled 2017-03-14 (×3): qty 2

## 2017-03-14 MED ORDER — DEXMEDETOMIDINE HCL IN NACL 400 MCG/100ML IV SOLN
0.1000 ug/kg/h | INTRAVENOUS | Status: DC
Start: 2017-03-14 — End: 2017-03-14
  Filled 2017-03-14: qty 100

## 2017-03-14 MED FILL — Magnesium Sulfate Inj 50%: INTRAMUSCULAR | Qty: 10 | Status: AC

## 2017-03-14 MED FILL — Heparin Sodium (Porcine) Inj 1000 Unit/ML: INTRAMUSCULAR | Qty: 30 | Status: AC

## 2017-03-14 MED FILL — Potassium Chloride Inj 2 mEq/ML: INTRAVENOUS | Qty: 40 | Status: AC

## 2017-03-14 SURGICAL SUPPLY — 121 items
ADAPTER CARDIO PERF ANTE/RETRO (ADAPTER) ×3 IMPLANT
APPLICATOR COTTON TIP 6IN STRL (MISCELLANEOUS) IMPLANT
APPLICATOR TIP COSEAL (VASCULAR PRODUCTS) IMPLANT
BAG DECANTER FOR FLEXI CONT (MISCELLANEOUS) ×3 IMPLANT
BANDAGE ACE 4X5 VEL STRL LF (GAUZE/BANDAGES/DRESSINGS) ×3 IMPLANT
BANDAGE ACE 6X5 VEL STRL LF (GAUZE/BANDAGES/DRESSINGS) ×3 IMPLANT
BLADE STERNUM SYSTEM 6 (BLADE) ×3 IMPLANT
BLADE SURG 11 STRL SS (BLADE) ×3 IMPLANT
BLADE SURG 15 STRL LF DISP TIS (BLADE) ×2 IMPLANT
BLADE SURG 15 STRL SS (BLADE) ×1
BNDG GAUZE ELAST 4 BULKY (GAUZE/BANDAGES/DRESSINGS) ×3 IMPLANT
BOOT SUTURE AID YELLOW STND (SUTURE) IMPLANT
CANISTER SUCT 3000ML PPV (MISCELLANEOUS) ×3 IMPLANT
CANNULA GUNDRY RCSP 15FR (MISCELLANEOUS) ×3 IMPLANT
CATH CPB KIT GERHARDT (MISCELLANEOUS) ×3 IMPLANT
CATH FOLEY 2WAY SLVR 18FR 30CC (CATHETERS) IMPLANT
CATH HEART VENT LEFT (CATHETERS) ×2 IMPLANT
CATH THORACIC 28FR (CATHETERS) ×3 IMPLANT
CATH/SQUID NICHOLS JEHLE COR (CATHETERS) ×3 IMPLANT
CAUTERY EYE LOW TEMP 1300F FIN (OPHTHALMIC RELATED) IMPLANT
CLIP FOGARTY SPRING 6M (CLIP) IMPLANT
CONT SPEC 4OZ CLIKSEAL STRL BL (MISCELLANEOUS) ×9 IMPLANT
COUNTER NEEDLE 20 DBL MAG RED (NEEDLE) ×3 IMPLANT
COVER SURGICAL LIGHT HANDLE (MISCELLANEOUS) ×3 IMPLANT
CRADLE DONUT ADULT HEAD (MISCELLANEOUS) IMPLANT
DERMABOND ADVANCED (GAUZE/BANDAGES/DRESSINGS) ×1
DERMABOND ADVANCED .7 DNX12 (GAUZE/BANDAGES/DRESSINGS) ×2 IMPLANT
DRAIN CHANNEL 28F RND 3/8 FF (WOUND CARE) ×3 IMPLANT
DRAPE CARDIOVASCULAR INCISE (DRAPES) ×1
DRAPE SLUSH/WARMER DISC (DRAPES) ×3 IMPLANT
DRAPE SRG 135X102X78XABS (DRAPES) ×2 IMPLANT
DRSG AQUACEL AG ADV 3.5X14 (GAUZE/BANDAGES/DRESSINGS) ×3 IMPLANT
ELECT BLADE 4.0 EZ CLEAN MEGAD (MISCELLANEOUS) ×6
ELECT CAUTERY BLADE 6.4 (BLADE) ×3 IMPLANT
ELECT REM PT RETURN 9FT ADLT (ELECTROSURGICAL) ×6
ELECTRODE BLDE 4.0 EZ CLN MEGD (MISCELLANEOUS) ×4 IMPLANT
ELECTRODE REM PT RTRN 9FT ADLT (ELECTROSURGICAL) ×4 IMPLANT
FELT TEFLON 1X6 (MISCELLANEOUS) ×6 IMPLANT
GAUZE SPONGE 4X4 12PLY STRL (GAUZE/BANDAGES/DRESSINGS) ×6 IMPLANT
GAUZE SPONGE 4X4 12PLY STRL LF (GAUZE/BANDAGES/DRESSINGS) ×9 IMPLANT
GLOVE BIO SURGEON STRL SZ 6.5 (GLOVE) ×18 IMPLANT
GLOVE BIO SURGEON STRL SZ7 (GLOVE) ×12 IMPLANT
GLOVE BIOGEL PI IND STRL 6.5 (GLOVE) ×8 IMPLANT
GLOVE BIOGEL PI IND STRL 7.0 (GLOVE) ×6 IMPLANT
GLOVE BIOGEL PI INDICATOR 6.5 (GLOVE) ×4
GLOVE BIOGEL PI INDICATOR 7.0 (GLOVE) ×3
GOWN STRL REUS W/ TWL LRG LVL3 (GOWN DISPOSABLE) ×16 IMPLANT
GOWN STRL REUS W/TWL LRG LVL3 (GOWN DISPOSABLE) ×8
GRAFT WOVEN D/V 32DX30L (Vascular Products) ×3 IMPLANT
HEMOSTAT POWDER SURGIFOAM 1G (HEMOSTASIS) ×12 IMPLANT
HEMOSTAT SURGICEL 2X14 (HEMOSTASIS) ×3 IMPLANT
INSERT FOGARTY XLG (MISCELLANEOUS) ×3 IMPLANT
IV CATH 18G X1.75 CATHLON (IV SOLUTION) ×3 IMPLANT
KIT BASIN OR (CUSTOM PROCEDURE TRAY) ×3 IMPLANT
KIT CATH SUCT 8FR (CATHETERS) ×3 IMPLANT
KIT ROOM TURNOVER OR (KITS) ×3 IMPLANT
KIT SUCTION CATH 14FR (SUCTIONS) ×12 IMPLANT
KIT VASOVIEW HEMOPRO VH 3000 (KITS) ×3 IMPLANT
LEAD PACING MYOCARDI (MISCELLANEOUS) ×3 IMPLANT
LINE VENT (MISCELLANEOUS) ×3 IMPLANT
MARKER GRAFT CORONARY BYPASS (MISCELLANEOUS) ×9 IMPLANT
NS IRRIG 1000ML POUR BTL (IV SOLUTION) ×18 IMPLANT
PACK OPEN HEART (CUSTOM PROCEDURE TRAY) ×3 IMPLANT
PAD ARMBOARD 7.5X6 YLW CONV (MISCELLANEOUS) ×12 IMPLANT
PAD ELECT DEFIB RADIOL ZOLL (MISCELLANEOUS) ×3 IMPLANT
PENCIL BUTTON HOLSTER BLD 10FT (ELECTRODE) ×6 IMPLANT
SEALANT SURG COSEAL 8ML (VASCULAR PRODUCTS) ×3 IMPLANT
SET CARDIOPLEGIA MPS 5001102 (MISCELLANEOUS) ×3 IMPLANT
SOLUTION ANTI FOG 6CC (MISCELLANEOUS) ×3 IMPLANT
SPONGE GAUZE 4X4 12PLY STER LF (GAUZE/BANDAGES/DRESSINGS) IMPLANT
SPONGE LAP 18X18 X RAY DECT (DISPOSABLE) ×6 IMPLANT
STOPCOCK 4 WAY LG BORE MALE ST (IV SETS) IMPLANT
SURGIFLO W/THROMBIN 8M KIT (HEMOSTASIS) ×3 IMPLANT
SUT BONE WAX W31G (SUTURE) ×3 IMPLANT
SUT ETHIBON 2 0 V 52N 30 (SUTURE) ×6 IMPLANT
SUT ETHIBOND 2 0 SH (SUTURE) ×5
SUT ETHIBOND 2 0 SH 36X2 (SUTURE) ×10 IMPLANT
SUT MNCRL AB 4-0 PS2 18 (SUTURE) ×3 IMPLANT
SUT PROLENE 3 0 RB 1 (SUTURE) ×3 IMPLANT
SUT PROLENE 3 0 SH 48 (SUTURE) IMPLANT
SUT PROLENE 3 0 SH1 36 (SUTURE) ×15 IMPLANT
SUT PROLENE 4 0 RB 1 (SUTURE) ×7
SUT PROLENE 4 0 TF (SUTURE) ×6 IMPLANT
SUT PROLENE 4-0 RB1 .5 CRCL 36 (SUTURE) ×14 IMPLANT
SUT PROLENE 5 0 C 1 36 (SUTURE) ×6 IMPLANT
SUT PROLENE 6 0 C 1 30 (SUTURE) ×6 IMPLANT
SUT PROLENE 6 0 CC (SUTURE) ×18 IMPLANT
SUT PROLENE 7 0 BV1 MDA (SUTURE) ×3 IMPLANT
SUT PROLENE 8 0 BV175 6 (SUTURE) ×9 IMPLANT
SUT SILK  1 MH (SUTURE) ×3
SUT SILK 1 MH (SUTURE) ×6 IMPLANT
SUT SILK 1 TIES 10X30 (SUTURE) ×3 IMPLANT
SUT SILK 2 0 SH CR/8 (SUTURE) ×6 IMPLANT
SUT SILK 2 0 TIES 10X30 (SUTURE) ×3 IMPLANT
SUT SILK 2 0 TIES 17X18 (SUTURE) ×1
SUT SILK 2-0 18XBRD TIE BLK (SUTURE) ×2 IMPLANT
SUT SILK 3 0 SH CR/8 (SUTURE) ×3 IMPLANT
SUT SILK 4 0 TIE 10X30 (SUTURE) ×6 IMPLANT
SUT STEEL 6MS V (SUTURE) ×3 IMPLANT
SUT STEEL SZ 6 DBL 3X14 BALL (SUTURE) ×3 IMPLANT
SUT TEM PAC WIRE 2 0 SH (SUTURE) ×12 IMPLANT
SUT VIC AB 1 CTX 18 (SUTURE) ×6 IMPLANT
SUT VIC AB 2-0 CT1 27 (SUTURE) ×1
SUT VIC AB 2-0 CT1 TAPERPNT 27 (SUTURE) ×2 IMPLANT
SUT VIC AB 2-0 CTX 27 (SUTURE) ×6 IMPLANT
SUT VIC AB 3-0 X1 27 (SUTURE) ×6 IMPLANT
SUTURE E-PAK OPEN HEART (SUTURE) IMPLANT
SYSTEM SAHARA CHEST DRAIN ATS (WOUND CARE) ×3 IMPLANT
TAPE CLOTH SURG 4X10 WHT LF (GAUZE/BANDAGES/DRESSINGS) ×3 IMPLANT
TOWEL GREEN STERILE (TOWEL DISPOSABLE) ×6 IMPLANT
TOWEL GREEN STERILE FF (TOWEL DISPOSABLE) ×3 IMPLANT
TOWEL OR 17X24 6PK STRL BLUE (TOWEL DISPOSABLE) IMPLANT
TOWEL OR 17X26 10 PK STRL BLUE (TOWEL DISPOSABLE) IMPLANT
TRAY FOLEY SILVER 16FR TEMP (SET/KITS/TRAYS/PACK) ×3 IMPLANT
TUBE CONNECTING 12X1/4 (SUCTIONS) ×3 IMPLANT
TUBING INSUFFLATION (TUBING) ×3 IMPLANT
UNDERPAD 30X30 (UNDERPADS AND DIAPERS) ×3 IMPLANT
VALVE MAGNA EASE AORTIC 25MM (Prosthesis & Implant Heart) ×3 IMPLANT
VENT LEFT HEART 12002 (CATHETERS) ×3
WATER STERILE IRR 1000ML POUR (IV SOLUTION) ×6 IMPLANT
YANKAUER SUCT BULB TIP NO VENT (SUCTIONS) ×3 IMPLANT

## 2017-03-14 NOTE — Brief Op Note (Addendum)
03/14/2017  3:06 PM  PATIENT:  William Contreras  61 y.o. male  PRE-OPERATIVE DIAGNOSIS:  TAA AS AI  POST-OPERATIVE DIAGNOSIS:  TAA AS AI  PROCEDURE:  Procedure(s):  CORONARY BYPASS GRAFTING x 2 -LIMA to LAD -SVG to RAMUS INTERMEDIATE  WHEAT PROCEDURE -Aortic Valve Replacement with a 25 mm Edwards Magna Ease Pericardial Tissue Valve -Replacement of ascending aorta with a 32 mm Hemashield Graft  ENDOSCOPIC HARVEST GREATER SAPHENOUS VEIN -Left Thigh -Right Thigh opened, saphenous vein too large for conduit  TRANSESOPHAGEAL ECHOCARDIOGRAM (TEE) (N/A)  SURGEON:  Surgeon(s) and Role:    * Grace Isaac, MD - Primary  PHYSICIAN ASSISTANT: Erin Barrett PA-C  ANESTHESIA:   general  EBL:  Total I/O In: 3080 [I.V.:2000; Blood:1080] Out: 3505 [TTCNG:3943; Blood:1530]  BLOOD ADMINISTERED: CELLSAVER  DRAINS: Right and Left Pleural Chest Tube, Mediastinal Chest Drain   LOCAL MEDICATIONS USED:  NONE  SPECIMEN:  Source of Specimen:  Ascending Aorta, Aortic Valve Leaflets  DISPOSITION OF SPECIMEN:  PATHOLOGY  COUNTS:  YES   DICTATION: .Dragon Dictation  PLAN OF CARE: Admit to inpatient   PATIENT DISPOSITION:  ICU - intubated and hemodynamically stable.   Delay start of Pharmacological VTE agent (>24hrs) due to surgical blood loss or risk of bleeding: yes

## 2017-03-14 NOTE — Anesthesia Procedure Notes (Signed)
Arterial Line Insertion Start/End9/10/2016 6:56 AM, 03/14/2017 7:04 AM Performed by: CRNA  Preanesthetic checklist: patient identified, IV checked, site marked, risks and benefits discussed, surgical consent, monitors and equipment checked, pre-op evaluation, timeout performed and anesthesia consent Lidocaine 1% used for infiltration Left, radial was placed Catheter size: 20 G Hand hygiene performed , maximum sterile barriers used  and Seldinger technique used Allen's test indicative of satisfactory collateral circulation Attempts: 1 Procedure performed without using ultrasound guided technique. Following insertion, dressing applied and Biopatch. Post procedure assessment: normal  Patient tolerated the procedure well with no immediate complications.

## 2017-03-14 NOTE — OR Nursing (Signed)
Forty-five minute call to SICU charge nurse at [phone]. Spoke to [place].

## 2017-03-14 NOTE — Progress Notes (Signed)
  Echocardiogram Echocardiogram Transesophageal has been performed.  Darlina Sicilian M 03/14/2017, 8:26 AM

## 2017-03-14 NOTE — Anesthesia Procedure Notes (Signed)
Central Venous Catheter Insertion Performed by: Audry Pili, anesthesiologist Start/End9/10/2016 6:36 AM, 03/14/2017 6:53 AM Patient location: Pre-op. Preanesthetic checklist: patient identified, IV checked, risks and benefits discussed, surgical consent, monitors and equipment checked, pre-op evaluation, timeout performed and anesthesia consent Position: Trendelenburg Lidocaine 1% used for infiltration and patient sedated Hand hygiene performed , maximum sterile barriers used  and Seldinger technique used Catheter size: 8.5 Fr Total catheter length 10. Central line was placed.Sheath introducer Swan type:thermodilution PA Cath depth:50 Procedure performed using ultrasound guided technique. Ultrasound Notes:anatomy identified, needle tip was noted to be adjacent to the nerve/plexus identified, no ultrasound evidence of intravascular and/or intraneural injection and image(s) printed for medical record Attempts: 1 Following insertion, line sutured and dressing applied. Post procedure assessment: blood return through all ports, free fluid flow and no air  Patient tolerated the procedure well with no immediate complications.

## 2017-03-14 NOTE — Anesthesia Procedure Notes (Signed)
Procedure Name: Intubation Date/Time: 03/14/2017 7:48 AM Performed by: Mariea Clonts Pre-anesthesia Checklist: Patient identified, Emergency Drugs available, Suction available and Patient being monitored Patient Re-evaluated:Patient Re-evaluated prior to induction Oxygen Delivery Method: Circle System Utilized Preoxygenation: Pre-oxygenation with 100% oxygen Induction Type: IV induction Ventilation: Mask ventilation without difficulty Laryngoscope Size: Miller and 2 Grade View: Grade II Tube type: Oral Tube size: 8.0 mm Number of attempts: 1 Airway Equipment and Method: Stylet and Oral airway Placement Confirmation: ETT inserted through vocal cords under direct vision,  positive ETCO2 and breath sounds checked- equal and bilateral Tube secured with: Tape Dental Injury: Teeth and Oropharynx as per pre-operative assessment

## 2017-03-14 NOTE — Progress Notes (Signed)
Initiated Open Heart Rapid Wean per Protocol. RN at bedside. RT will continue to monitor.

## 2017-03-14 NOTE — Progress Notes (Signed)
Settings modified per rapid wean protocol.   

## 2017-03-14 NOTE — Progress Notes (Signed)
[name]       [place] [address]             [phone]                    William Contreras  Medical Record #[id number] Date of Birth: [birth year]  Referring:Dr Aundra Dubin Primary Care: Angelina Sheriff, MD  Chief Complaint:    Aortic Stenosis, cad and dilated acending aorta    History of Present Illness:    William Contreras 61 y.o. male followed in the  office  today after recent cardiac cath  by Dr Aundra Dubin Cardiac Cath .   I first saw him in January 2018 after a MRI of the chest. He was seen one month ago after repeat cta of chest. Since January he notes increasing symptoms of shortness of breath with exertion and chest discomfort. He has had no syncope or presyncope.   He presented to cardiology in 02/2015 with acute systolic CHF .  Troponin was mildly elevated to 0.04.  Echo showed EF 30-35%. Cardiac Cath showed diffuse severe disease but the LAD disease was relatively mild .  He had PCI with DES to mLCx and proximal to mid RCA.  Cardiac MRI/MRA chest showed EF 39%, bicuspid aortic valve with probable moderate aortic stenosis, 4.9 cm ascending aorta.  Most recent echo in 4/17 with EF 40-45%, moderate AS/moderate AI, 4.5 cm aortic root.     He has no family history of bicuspid valve, dilated aorta , aortic dissection or sudden unexplaind death of 1st degree relatives. His father died in his 52's in MVA, mother is alive at 55. Has brother and sister without known cardiac disease.     Current Activity/ Functional Status:  Patient is independent with mobility/ambulation, transfers, ADL's, IADL's.   Zubrod Score: At the time of surgery this patient's most appropriate activity status/level should be described as: [x]     0    Normal activity, no symptoms []     1    Restricted in physical strenuous activity but ambulatory, able to do out light work []     2    Ambulatory and capable of self care, unable to do work activities, up and about                >50 % of waking hours                              []     3    Only limited self care, in bed greater than 50% of waking hours []     4    Completely disabled, no self care, confined to bed or chair []     5    Moribund   Past Medical History:  Diagnosis Date  . CHF (congestive heart failure) ([facility])   . Coronary artery disease   . Essential hypertension   . GERD (gastroesophageal reflux disease)   . Heart murmur   . Morbid obesity ([place])   . Neuromuscular disorder ([place])    burning in both feet - pt. thinks its related to statin   . Sleep apnea    told that he was borderline, but he has lost 50 lbs & is feeling much better     Past Surgical History:  Procedure Laterality Date  . CARDIAC CATHETERIZATION N/A 03/10/2015   Procedure: Right/Left Heart Cath and Coronary Angiography;  Surgeon:  Larey Dresser, MD;  Location: [facility];  Service: Cardiovascular;  Laterality: N/A;  . CARDIAC CATHETERIZATION N/A 03/10/2015   Procedure: Coronary Stent Intervention;  Surgeon: Leonie Man, MD;  Location: [facility];  Service: Cardiovascular;  Laterality: N/A;  . TONSILLECTOMY      Family History  Problem Relation Age of Onset  . Other Father        died young in [place].  Marland Kitchen Heart attack Mother        alive @ 90.  . Other Brother        A & W  . Other Sister        A & W    Social History   Social History  . Marital status: Married    Spouse name: N/A  . Number of children: N/A  . Years of education: N/A   Occupational History  . Not on file.   Social History Main Topics  . Smoking status: Former Smoker    Packs/day: 1.00    Years: 15.00    Types: Cigarettes  . Smokeless tobacco: Never Used     Comment: quit smoking ~ 20 yrs ago.  . Alcohol use No     Comment: prev drank - none in ~ 25 yrs.  . Drug use: No     Comment: prev smoked MJ, none in 25 yrs.  . Sexual activity: Yes   Other Topics Concern  . Not on file   Social History Narrative   Pt lives  in [place] with his wife and son.  He has 2 step children and another child - all grown and out of the house.    History  Smoking Status  . Former Smoker  . Packs/day: 1.00  . Years: 15.00  . Types: Cigarettes  Smokeless Tobacco  . Never Used    Comment: quit smoking ~ 20 yrs ago.    History  Alcohol Use No    Comment: prev drank - none in ~ 25 yrs.     Allergies  Allergen Reactions  . Lipitor [Atorvastatin] Other (See Comments)    Joint and head pains  . Penicillins Other (See Comments)    Lightheadness Has patient had a PCN reaction causing immediate rash, facial/tongue/throat swelling, SOB or lightheadedness with hypotension:unknown Has patient had a PCN reaction causing severe rash involving mucus membranes or skin necrosis: No Has patient had a PCN reaction that required hospitalization: No Has patient had a PCN reaction occurring within the last 10 years: No If all of the above answers are "NO", then may proceed with Cephalosporin use. Childhood allergy rec'd Amoxicillin with     Current Facility-Administered Medications  Medication Dose Route Frequency Provider Last Rate Last Dose  . cefUROXime (ZINACEF) 1.5 g in dextrose 5 % 50 mL IVPB  1.5 g Intravenous To OR Grace Isaac, MD      . cefUROXime (ZINACEF) 750 mg in dextrose 5 % 50 mL IVPB  750 mg Intravenous To OR Grace Isaac, MD      . chlorhexidine (HIBICLENS) 4 % liquid 2 application  30 mL Topical UD Grace Isaac, MD      . dexmedetomidine (PRECEDEX) 400 MCG/100ML (4 mcg/mL) infusion  0.1-0.7 mcg/kg/hr Intravenous To OR Grace Isaac, MD      . DOPamine (INTROPIN) 800 mg in dextrose 5 % 250 mL (3.2 mg/mL) infusion  0-10 mcg/kg/min Intravenous To OR Grace Isaac, MD      .  EPINEPHrine (ADRENALIN) 4 mg in dextrose 5 % 250 mL (0.016 mg/mL) infusion  0-10 mcg/min Intravenous To OR Grace Isaac, MD      . heparin 2,500 Units, papaverine 30 mg in electrolyte-148 (PLASMALYTE-148)  500 mL irrigation   Irrigation To OR Grace Isaac, MD      . heparin 30,000 units/NS 1000 mL solution for CELLSAVER   Other To OR Grace Isaac, MD      . insulin regular (NOVOLIN R,HUMULIN R) 100 Units in sodium chloride 0.9 % 100 mL (1 Units/mL) infusion   Intravenous To OR Grace Isaac, MD      . magnesium sulfate (IV Push/IM) injection 40 mEq  40 mEq Other To OR Grace Isaac, MD      . metoprolol tartrate (LOPRESSOR) tablet 12.5 mg  12.5 mg Oral Once Grace Isaac, MD      . nitroGLYCERIN 50 mg in dextrose 5 % 250 mL (0.2 mg/mL) infusion  2-200 mcg/min Intravenous To OR Grace Isaac, MD      . phenylephrine (NEO-SYNEPHRINE) 20 mg in sodium chloride 0.9 % 250 mL (0.08 mg/mL) infusion  30-200 mcg/min Intravenous To OR Grace Isaac, MD      . potassium chloride injection 80 mEq  80 mEq Other To OR Grace Isaac, MD      . tranexamic acid (CYKLOKAPRON) 2,500 mg in sodium chloride 0.9 % 250 mL (10 mg/mL) infusion  1.5 mg/kg/hr Intravenous To OR Grace Isaac, MD      . tranexamic acid (CYKLOKAPRON) bolus via infusion - over 30 minutes 1,479 mg  15 mg/kg Intravenous To OR Grace Isaac, MD      . tranexamic acid (CYKLOKAPRON) pump prime solution 197 mg  2 mg/kg Intracatheter To OR Grace Isaac, MD      . vancomycin (VANCOCIN) 1,500 mg in sodium chloride 0.9 % 250 mL IVPB  1,500 mg Intravenous To OR Grace Isaac, MD       Facility-Administered Medications Ordered in Other Encounters  Medication Dose Route Frequency Provider Last Rate Last Dose  . fentaNYL (SUBLIMAZE) injection    Anesthesia Intra-op Clovis Cao, CRNA   100 mcg at 03/14/17 [phone]  . midazolam (VERSED) 5 MG/5ML injection    Anesthesia Intra-op Clovis Cao, CRNA   2 mg at 03/14/17 [id number]      Review of Systems:     Cardiac Review of Systems: Y or N  Chest Pain [  n  ]  Resting SOB [ n  ] Exertional SOB n [  ]  Orthopnea [ n ]   Pedal Edema [ n  ]      Palpitations [ n ] Syncope  [n  ]   Presyncope [  n ]  General Review of Systems: [Y] = yes [  ]=no Constitional: recent weight change [ n ];  Wt loss over the last 3 months [   ] anorexia [  ]; fatigue [  ]; nausea [  ]; night sweats [  ]; fever [  ]; or chills [  ];          Dental: poor dentition[  n]; Last Dentist visit: every 6 months sees dentist   Eye : blurred vision [  ]; diplopia [   ]; vision changes [  ];  Amaurosis fugax[  ]; Resp: cough [ n ];  wheezing[ n ];  hemoptysis[ n ]; shortness of breath[y  ];  paroxysmal nocturnal dyspnea[  ]; dyspnea on exertion[  ]; or orthopnea[  ];  GI:  gallstones[n  ], vomiting[  ];  dysphagia[  ]; melena[  ];  hematochezia [  ]; heartburn[  ];   Hx of  Colonoscopy[  ]; GU: kidney stones [  ]; hematuria[  ];   dysuria [  ];  nocturia[  ];  history of     obstruction [  ]; urinary frequency [  ]             Skin: rash, swelling[  ];, hair loss[  ];  peripheral edema[n  ];  or itching[  ]; Musculosketetal: myalgias[  ];  joint swelling[  ];  joint erythema[  ];  joint pain[  ];  back pain[  ];  Heme/Lymph: bruising[  ];  bleeding[  ];  anemia[  ];  Neuro: TIA[ n ];  headaches[  ];  stroke[  ];  vertigo[  ];  seizures[ n ];   paresthesias[n  ];  difficulty walking[n  ];  Psych:depression[  ]; anxiety[  ];  Endocrine: diabetes[n  ];  thyroid dysfunction[n  ];  Immunizations: Flu up to date William Contreras  ]; Pneumococcal up to date [ n ];  Other:  Physical Exam: BP (!) 136/93   Pulse (!) 52   Temp 97.8 F (36.6 C) (Oral)   Resp 18   SpO2 100%   PHYSICAL EXAMINATION: Physical Exam  Nursing note and vitals reviewed. Constitutional: He is oriented to person, place, and time. He appears well-nourished. No distress.  HENT:  Head: Normocephalic and atraumatic.  Mouth/Throat: No oropharyngeal exudate.  Eyes: Right eye exhibits no discharge. Left eye exhibits no discharge. No scleral icterus.  Neck: Neck supple. No JVD present. No tracheal deviation present. No  thyromegaly present.  Cardiovascular: Normal rate and regular rhythm.  Exam reveals no gallop and no friction rub.   Murmur heard. Respiratory: No stridor. No respiratory distress. He has no wheezes. He has no rales. He exhibits no tenderness.  GI: Soft. Bowel sounds are normal. He exhibits no distension and no mass. There is no tenderness. There is no rebound and no guarding.  Musculoskeletal: Normal range of motion. He exhibits no edema, tenderness or deformity.  Lymphadenopathy:    He has no cervical adenopathy.  Neurological: He is alert and oriented to person, place, and time. He has normal reflexes. No cranial nerve deficit. Coordination normal.  Skin: No rash noted. He is not diaphoretic. No erythema.  Psychiatric: He has a normal mood and affect. His behavior is normal. Thought content normal.  Severe bilateral lower extremity varicosities below the knees   Diagnostic Studies & Laboratory data:     Recent Radiology Findings:   Ct Angio Chest Aorta W &/or Wo Contrast  Result Date: 01/26/2017 CLINICAL DATA:  Pre-op for aneurysm repair and aortic valve replacement. Slight chest pain, SOB on exertion. Fatigue. Nonsmoker over 20 yrs. Heart stents x 2. No hx ca. EXAM: CT ANGIOGRAPHY CHEST WITH CONTRAST TECHNIQUE: Multidetector CT imaging of the chest was performed using the standard protocol during bolus administration of intravenous contrast. Multiplanar CT image reconstructions and MIPs were obtained to evaluate the vascular anatomy. CONTRAST:  75 cc Isovue 370 COMPARISON:  MRA chest dated 06/24/2016. FINDINGS: Cardiovascular: Ascending thoracic aorta measures 4.9 cm diameter. Atherosclerosis at the aortic valve. Aortic arch and descending thoracic aorta are normal in caliber. Heart size is normal. No pericardial effusion. Coronary artery calcifications. Mediastinum/Nodes: No mass or enlarged  lymph nodes within the mediastinum or perihilar regions. Esophagus appears normal. Trachea and  central bronchi are unremarkable. Lungs/Pleura: Lungs are clear.  No pleural effusion or pneumothorax. Upper Abdomen: Limited images of the upper abdomen are unremarkable. Musculoskeletal: Degenerative changes throughout the slightly kyphotic thoracic spine, mild to moderate in degree, with evidence of DISH. No acute or suspicious osseous finding. Review of the MIP images confirms the above findings. IMPRESSION: 1. No acute findings. No aortic dissection. No pneumonia or pulmonary edema. No pericardial effusion. 2. 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 3. Aortic atherosclerosis. 4. Coronary artery calcifications, particularly dense within the left main and left circumflex coronary arteries. Recommend correlation with any possible associated cardiac symptoms. Aortic aneurysm NOS (ICD10-I71.9). Aortic Atherosclerosis (ICD10-I70.0). Electronically Signed   By: Franki Cabot M.D.   On: 01/26/2017 15:40   I have independently reviewed the above radiology studies  and reviewed the findings with the patient.    Mr Angiogram Chest W Wo Contrast  Result Date: 06/24/2016 CLINICAL DATA:  Evaluate thoracic aortic aneurysm. EXAM: MRA CHEST WITH CONTRAST TECHNIQUE: Angiographic images of the chest were obtained using MRA technique with intravenous contrast. CONTRAST:  73mL MULTIHANCE GADOBENATE DIMEGLUMINE 529 MG/ML IV SOLN COMPARISON:  Chest MRI - 07/09/2015 FINDINGS: Vascular Findings: Grossly unchanged fusiform aneurysmal dilatation of the ascending thoracic aorta was measurements as follows. The thoracic aorta tapers to a normal caliber at the level of the aortic arch. Bovine configuration of the aortic arch. The branch vessels of the aortic arch remain widely patent throughout their imaged course. Normal heart size.  No pericardial effusion. Although this examination was not  tailored for the evaluation the pulmonary arteries, there are no discrete filling defects within the central pulmonary arterial tree to suggest central pulmonary embolism. Normal caliber of the main pulmonary artery. ------------------------------------------------------------- Thoracic aortic measurements: Sinotubular junction 43 mm as measured in greatest oblique sagittal dimension (image 42, series 1101). Proximal ascending aorta 51 mm as measured in greatest oblique sagittal dimension at the level of the main pulmonary artery (image 35, series 1101) and approximately 50 mm in greatest oblique axial diameter (image 47, series 12), unchanged since the 06/2015 examination. Aortic arch aorta 30 mm as measured in greatest oblique sagittal dimension (image 39, series 1101). Proximal descending thoracic aorta 29 mm as measured in greatest oblique axial dimension at the level of the main pulmonary artery (image 47, series 12). Distal descending thoracic aorta 27 mm as measured in greatest oblique axial dimension at the level of the diaphragmatic hiatus (sagittal image 27, series 1101). Review of the MIP images confirms the above findings. ------------------------------------------------------------- Non-Vascular Findings: Mediastinum/Lymph Nodes: No bulky mediastinal, hilar axillary lymphadenopathy. Lungs/Pleura: No focal airspace opacities.  No pleural effusion. Upper abdomen: Limited visualization of the upper abdomen is unremarkable. Musculoskeletal: Regional osseous and soft tissue structures are unremarkable. Susceptibility artifact involves the medial aspect of the left chest, potentially at a location of a loop recorder device. IMPRESSION: Stable uncomplicated fusiform aneurysmal dilatation of the ascending thoracic aorta measuring 51 mm in greatest diameter, unchanged since the 06/2015 examination. Electronically Signed   By: Sandi Mariscal M.D.   On: 06/24/2016 16:34    CLINICAL DATA:  ?Bicuspid aortic valve,  dilated ascending aorta, ischemic cardiomyopathy  EXAM: CARDIAC MRI  TECHNIQUE: The patient was scanned on a 1.5 Tesla GE magnet. A dedicated cardiac coil was used. Functional imaging was  done using Fiesta sequences. 2,3, and 4 chamber views were done to assess for RWMA's. Modified Simpson's rule using a short axis stack was used to calculate an ejection fraction on a dedicated work Conservation officer, nature. The patient received 35 cc of Multihance. MR angiography was done. After 10 minutes inversion recovery sequences were used to assess for infiltration and scar tissue.  CONTRAST:  35 cc Multihance  FINDINGS: Limited images of the lung fields showed no significant abnormalities.  Mildly dilated left ventricle with moderate LV hypertrophy. The inferior, inferolateral, and anterolateral walls are hypokinetic. The calculated LV ejection fraction is 39%. Normal right ventricular size and systolic function. The right and left atrial sizes are normal. Probably mild mitral regurgitation. The aortic valve is bicuspid. There is mild aortic insufficiency. Visually, there appears to be at least moderate aortic stenosis.  On delayed enhancement imaging, there was 26-50% wall thickness subendocardial late gadolinium enhancement (LGE) in a small area of the mid inferior wall. There is < 25% wall thickness subendocardial LGE in a small area of the mid inferolateral wall.  On MR angiography, there was dilation of the aortic root (4.1 cm) and ascending aorta (4.9 cm). There was a bovine arch. Pulmonary veins drained normally to the left atrium.  MEASUREMENTS: MEASUREMENTS LV EDV 274 mL  LV SV 108 mL  LV EF 39%  Aortic root 4.1 cm  Ascending aorta 4.9 cm in greatest dimension  Aortic arch 2.8 cm  Descending thoracic aorta 2.7 cm  IMPRESSION: 1. Mildly dillated LV with moderate LV hypertrophy. EF 39% with wall motion abnormalities as above.  2. Bicuspid  aortic valve with associated moderate ascending aorta dilatation to maximal 4.9 cm. The aortic valve is at least moderately stenotic visually, suggest correlation by echo.  3. Small areas of LGE in a coronary disease pattern in the inferior and inferolateral walls as described above.  Dalton Mclean   Electronically Signed   By: Loralie Champagne M.D.   On: 07/10/2015 17:48  I have independently reviewed the above radiology studies  and reviewed the findings with the patient. Cardiac Cath: Procedures   RIGHT HEART CATH AND CORONARY ANGIOGRAPHY  Conclusion   1. Normal filling pressures and preserved cardiac output.  2. Nonobstructive disease in the RCA.  3. Severe diffuse disease in the LCx system involving a moderate ramus and 3 relatively small obtuse marginal branches.  4. Occluded 1st diagonal, likely a moderate vessel.  5. Moderate (50-60%) mid LAD stenosis.  6. 40-50% distal left main stenosis.   Extensive CAD, will need to discuss CABG options with Dr. Servando Snare. Unfortunately, the vessels with the worst disease are relatively small.   Procedural Details/Technique   Technical Details Procedure: Right Heart Cath, Selective Coronary Angiography  Indication: Pre-AVR   Procedural Details: The right brachial and radial areas were prepped, draped, and anesthetized with 1% lidocaine. There was a pre-existing peripheral IV in the right brachial area. This was replaced with a 23F venous sheath. A Swan-Ganz catheter was used for the right heart catheterization. Standard protocol was followed for recording of right heart pressures and sampling of oxygen saturations. Fick cardiac output was calculated. The right radial artery was entered using modified Seldinger technique and a 11F sheath was placed. The patient received 3 mg IA verapamil and weight-based IV heparin. Standard Judkins catheters were used for selective coronary angiography. There were no immediate procedural complications.  The patient was transferred to the post catheterization recovery area for further monitoring.   Estimated  blood loss <50 mL.  During this procedure the patient was administered the following to achieve and maintain moderate conscious sedation: Versed 2 mg, Fentanyl 50 mcg, while the patient's heart rate, blood pressure, and oxygen saturation were continuously monitored. The period of conscious sedation was 43 minutes, of which I was present face-to-face 100% of this time.    Coronary Findings   Dominance: Right  Left Main  40-50% distal left main stenosis.  Left Anterior Descending  50-60% mid LAD stenosis. Moderate D1 appears totally occluded with some late collateral filling.  Ramus Intermedius  70% proximal ramus stenosis (moderate vessel).  Left Circumflex  30% proximal LCx stenosis. Stent in proximal LCx is patent. OM1 is a small-moderate vessel bifurcating early. Suspect OM1 has 80% ostial stenosis with subtotal occlusion of the superior branch of OM1. Small OM2 is subtotally occluded at the ostium. Small to moderate PLOM (OM3) is subtotally occluded at the ostium.  Right Coronary Artery  40% proximal RCA stenosis. Following this, there was a long stented segment in the proximal to mid RCA without significant stenosis. 40% mid RCA stenosis distal to stent. 40% distal RCA stenosis just prior to bifurcation with 50% ostial PLV stenosis and 40% ostial PDA stenosis.  Right Heart   Right Heart Pressures RHC Procedural Findings: Hemodynamics (mmHg) RA mean 5 RV 21/8 PA 18/7, mean 11 PCWP mean 7 AO 97/67  Oxygen saturations: PA 69% AO 100%  Cardiac Output (Fick) 4.75  Cardiac Index (Fick) 2.22         cardiac cath with stents : 03/10/2015 Procedures   Coronary Stent Intervention  Conclusion   1. Prox RCA to Mid RCA lesion, diffuse 50% & focal 80% stenosed. A drug-eluting stent was placed. There is a 0% residual stenosis post intervention. 2. Dist RCA lesion, 50%  stenosed. 3. Prox Cx lesion, 80% stenosed. A drug-eluting stent was placed. There is a 0% residual stenosis post intervention. 4. Ost 2nd Mrg to 2nd Mrg lesion & Ost 3rd Mrg to 3rd Mrg lesion, 100% stenosed. CTO with collateral filling 5. Ramus lesion, 60% stenosed. Mid LAD lesion, 50% stenosed.    Successful 2 vessel PCI involving a very complex/difficult intervention on extensive mid RCA lesion and proximal circumflex with a 90 takeoff of the circumflex.   Plan:  Standard post radial PCI care with TR band removal.  Brachial sheath can be removed 2 hours following discontinuation of Angiomax  Continue dual antiplatelet therapy for minimum one year  Continue aggressive cardiac risk factor modification and CHF treatment.  The patient does have moderate existing ramus intermedius disease which could potentially be a culprit for angina as well as the occluded OM 2 and OM 3 vessels.   ? The ramus is a potential PCI target, however the OM branches are likely not PCI target.  Patient disposition will be based on recommendation of the primary team/heart failure team. He likely requires additional CHF management prior to discharge.   Leonie Man, M.D., M.S. Interventional Cardiologist    ECHO:  Result status: Final result                              *[facility]*  [address]                        [place], [place] [address]                            [phone]  ------------------------------------------------------------------- Echocardiography  Patient:    William Contreras, William Contreras MR #:       [id number] Study Date: 10/30/2015 Gender:     M Age:        14 Height:     180.3 cm Weight:     97.7 kg BSA:        2.24 m^2 Pt. Status: Room:   ATTENDING    Loralie Champagne, M.D.  ORDERING     Loralie Champagne, M.D.  REFERRING    Loralie Champagne, M.D.  SONOGRAPHER  Tresa Res, RDCS  PERFORMING   [facility],  Outpatient  cc:  ------------------------------------------------------------------- LV EF: 40% -   45%  ------------------------------------------------------------------- Indications:      CHF - 428.0.  ------------------------------------------------------------------- History:   PMH:   Aortic valve disease.  Risk factors:  Former tobacco use.  ------------------------------------------------------------------- Study Conclusions  - Left ventricle: The cavity size was mildly dilated. There was   mild focal basal hypertrophy of the septum. Systolic function was   mildly to moderately reduced. The estimated ejection fraction was   in the range of 40% to 45%. There is hypokinesis of the   inferolateral myocardium. Doppler parameters are consistent with   abnormal left ventricular relaxation (grade 1 diastolic   dysfunction). - Aortic valve: Possibly bicuspid; severely thickened, severely   calcified leaflets. Valve mobility was restricted. There was   moderate stenosis. There was moderate regurgitation directed   centrally in the LVOT, eccentrically in the LVOT, and towards the   mitral anterior leaflet. Peak velocity (S): 329 cm/s. Mean   gradient (S): 33 mm Hg. Valve area (VTI): 1.42 cm^2. Valve area   (Vmax): 1.22 cm^2. Valve area (Vmean): 1.13 cm^2. - Aorta: Aortic root dimension: 45 mm (ED). - Ascending aorta: The ascending aorta was mildly dilated. - Mitral valve: Calcified annulus. Mildly thickened, mildly   calcified leaflets . There was mild regurgitation. - Left atrium: The atrium was moderately dilated. - Pulmonary arteries: Systolic pressure was mildly increased.  Impressions:  - EF is mildly improved when compared to prior (35%) Prior ECHO   measured aortic root-29mm. Current 39mm. Consider CT or MRI to   obtain more precise measurement.  Echocardiography.  M-mode, complete 2D, spectral Doppler, and color Doppler.  Birthdate:  Patient birthdate:  1956/07/01.  Age:  Patient is 61 yr old.  Sex:  Gender: male.    BMI: 30.1 kg/m^2.  Blood pressure:     128/68  Patient status:  Outpatient.  Study date: Study date: 10/30/2015. Study time: 09:51 AM.  Location:  Echo laboratory.  -------------------------------------------------------------------  ------------------------------------------------------------------- Left ventricle:  The cavity size was mildly dilated. There was mild focal basal hypertrophy of the septum. Systolic function was mildly to moderately reduced. The estimated ejection fraction was in the range of 40% to 45%.  Regional wall motion abnormalities:   There is hypokinesis of the inferolateral myocardium. Doppler parameters are consistent with abnormal left ventricular relaxation (grade 1 diastolic dysfunction).  ------------------------------------------------------------------- Aortic valve:   Possibly bicuspid; severely thickened, severely calcified leaflets. Valve mobility was restricted.  Doppler: There was moderate stenosis.   There was moderate regurgitation directed centrally in the LVOT,  eccentrically in the LVOT, and towards the mitral anterior leaflet.    VTI ratio of LVOT to aortic valve: 0.29. Valve area (VTI): 1.42 cm^2. Indexed valve area (VTI): 0.64 cm^2/m^2. Peak velocity ratio of LVOT to aortic valve: 0.25. Valve area (Vmax): 1.22 cm^2. Indexed valve area (Vmax): 0.55 cm^2/m^2. Mean velocity ratio of LVOT to aortic valve: 0.23. Valve area (Vmean): 1.13 cm^2. Indexed valve area (Vmean): 0.51 cm^2/m^2.    Mean gradient (S): 33 mm Hg. Peak gradient (S): 43 mm Hg.  ------------------------------------------------------------------- Aorta:  Ascending aorta: The ascending aorta was mildly dilated.  ------------------------------------------------------------------- Mitral valve:   Calcified annulus. Mildly thickened, mildly calcified leaflets . Mobility was not restricted.   Doppler: Transvalvular velocity was within the normal range. There was no evidence for stenosis. There was mild regurgitation.    Peak gradient (D): 2 mm Hg.  ------------------------------------------------------------------- Left atrium:  The atrium was moderately dilated.  ------------------------------------------------------------------- Right ventricle:  The cavity size was normal. Wall thickness was normal. Systolic function was normal.  ------------------------------------------------------------------- Pulmonic valve:   Poorly visualized.  Structurally normal valve. Cusp separation was normal.  Doppler:  Transvalvular velocity was within the normal range. There was no evidence for stenosis. There was no regurgitation.  ------------------------------------------------------------------- Tricuspid valve:   Structurally normal valve.    Doppler: Transvalvular velocity was within the normal range. There was trivial regurgitation.  ------------------------------------------------------------------- Pulmonary artery:   The main pulmonary artery was normal-sized. Systolic pressure was mildly increased.  ------------------------------------------------------------------- Right atrium:  The atrium was normal in size.  ------------------------------------------------------------------- Pericardium:  A prominent pericardial fat pad was present. There was no pericardial effusion.  ------------------------------------------------------------------- Systemic veins: Inferior vena cava: The vessel was normal in size.  ------------------------------------------------------------------- Measurements   Left ventricle                            Value          Reference  LV ID, ED, PLAX chordal            (H)    63.9  mm       43 - 52  LV ID, ES, PLAX chordal            (H)    51.3  mm       23 - 38  LV fx shortening, PLAX chordal     (L)    20    %        >=29  LV PW  thickness, ED                       11.3  mm       ---------  IVS/LV PW ratio, ED                       1.09           <=1.3  Stroke volume, 2D                         109   ml       ---------  Stroke volume/bsa, 2D                     49    ml/m^2   ---------  LV ejection fraction, 1-p A4C             56    %        ---------  LV end-diastolic volume, 2-p              190   ml       ---------  LV end-systolic volume, 2-p               105   ml       ---------  LV ejection fraction, 2-p                 45    %        ---------  Stroke volume, 2-p                        85    ml       ---------  LV end-diastolic volume/bsa, 2-p          85    ml/m^2   ---------  LV end-systolic volume/bsa, 2-p           47    ml/m^2   ---------  Stroke volume/bsa, 2-p                    38    ml/m^2   ---------  LV e&', lateral                            4.9   cm/s     ---------  LV E/e&', lateral                          15.45          ---------  LV e&', medial                             5.98  cm/s     ---------  LV E/e&', medial                           12.66          ---------  LV e&', average                            5.44  cm/s     ---------  LV E/e&', average                          13.92          ---------  Longitudinal strain, TDI                  14    %        ---------    Ventricular septum                        Value          Reference  IVS thickness, ED                         12.3  mm       ---------    LVOT                                      Value  Reference  LVOT ID, S                                25    mm       ---------  LVOT area                                 4.91  cm^2     ---------  LVOT peak velocity, S                     82    cm/s     ---------  LVOT mean velocity, S                     56    cm/s     ---------  LVOT VTI, S                               22.2  cm       ---------    Aortic valve                              Value          Reference  Aortic valve  peak velocity, S             329   cm/s     ---------  Aortic valve mean velocity, S             243   cm/s     ---------  Aortic valve VTI, S                       76.8  cm       ---------  Aortic mean gradient, S                   33    mm Hg    ---------  Aortic peak gradient, S                   43    mm Hg    ---------  VTI ratio, LVOT/AV                        0.29           ---------  Aortic valve area, VTI                    1.42  cm^2     ---------  Aortic valve area/bsa, VTI                0.64  cm^2/m^2 ---------  Velocity ratio, peak, LVOT/AV             0.25           ---------  Aortic valve area, peak velocity          1.22  cm^2     ---------  Aortic valve area/bsa, peak               0.55  cm^2/m^2 ---------  velocity  Velocity ratio, mean, LVOT/AV             0.23           ---------  Aortic valve area, mean velocity          1.13  cm^2     ---------  Aortic valve area/bsa, mean               0.51  cm^2/m^2 ---------  velocity    Aorta                                     Value          Reference  Aortic root ID, ED                        45    mm       ---------    Left atrium                               Value          Reference  LA ID, A-P, ES                            49    mm       ---------  LA ID/bsa, A-P                            2.19  cm/m^2   <=2.2  LA volume, ES, 1-p A4C                    47.7  ml       ---------  LA volume/bsa, ES, 1-p A4C                21.3  ml/m^2   ---------  LA volume, ES, 1-p A2C                    100   ml       ---------  LA volume/bsa, ES, 1-p A2C                44.7  ml/m^2   ---------    Mitral valve                              Value          Reference  Mitral E-wave peak velocity               75.7  cm/s     ---------  Mitral A-wave peak velocity               77.1  cm/s     ---------  Mitral deceleration time           (H)    408   ms       150 - 230  Mitral peak gradient, D                   2     mm Hg    ---------   Mitral E/A ratio, peak                    1              ---------    Pulmonary arteries  Value          Reference  PA pressure, S, DP                        30    mm Hg    <=30    Tricuspid valve                           Value          Reference  Tricuspid regurg peak velocity            259   cm/s     ---------  Tricuspid peak RV-RA gradient             27    mm Hg    ---------    Systemic veins                            Value          Reference  Estimated CVP                             3     mm Hg    ---------    Right ventricle                           Value          Reference  RV pressure, S, DP                        30    mm Hg    <=30  RV s&', lateral, S                         18.1  cm/s     ---------  Legend: (L)  and  (H)  mark values outside specified reference range.  ------------------------------------------------------------------- Prepared and Electronically Authenticated by  Candee Furbish, M.D. 2017-04-21T10:59:44     Recent Lab Findings: Lab Results  Component Value Date   WBC 6.4 03/09/2017   HGB 14.8 03/09/2017   HCT 42.6 03/09/2017   PLT 215 03/09/2017   GLUCOSE 108 (H) 03/09/2017   CHOL 234 (H) 10/21/2016   TRIG 323 (H) 10/21/2016   HDL 43 10/21/2016   LDLCALC 126 (H) 10/21/2016   ALT 20 03/09/2017   AST 21 03/09/2017   NA 137 03/09/2017   K 4.2 03/09/2017   CL 103 03/09/2017   CREATININE 0.99 03/09/2017   BUN 16 03/09/2017   CO2 26 03/09/2017   TSH 0.941 03/09/2015   INR 1.00 03/09/2017   HGBA1C 5.1 03/09/2017   Aortic Size Index=    5.0     /There is no height or weight on file to calculate BSA. = 2.27  < 2.75 cm/m2      4% risk per year 2.75 to 4.25          8% risk per year > 4.25 cm/m2    20% risk per year  The [employer] [facility]) and the [place] ([place]) have issued a statement to clarify 2 previous guidelines from the [facility] [place], and collaborating societies addressing the  risk of aortic dissection in patients with bicuspid aortic valves (BAV) and severe aortic enlargement.  The 2 guidelines differ with regard to the recommended threshold of aortic root or ascending aortic dilatation that would justify surgical intervention in patients with bicuspid aortic valves. This new statement of clarification uses the ACC/AHA revised structure for delineating the Class of Recommendation and Level of Evidence to provide recommendation that replace those contained in Section 9.2.2.1 of the thoracic aortic disease guidelines and Section 5.1.3 of the valvular heart disease guideline. New recommendations in intervention in patients with BAV and dilatation of the aortic root (sinuses) or ascending aorta include:  . Operative intervention to repair or replace the aortic root (sinuses) or replace the ascending aorta is indicated in asymptomatic patients with BAV if the diameter of the aortic root or ascending aorta is 5.5 cm or greater. (Class of recommendation 1, Level of evidence B-NR).  Marland Kitchen Operative intervention to repair or replace the aortic root (sinuses) or replace the ascending aorta is reasonable in asymptomatic patients with BAV if the diameter of the aortic root or ascending aorta is 5.0 cm or greater and an additional risk factor for dissection is present or if the patient is at low surgical risk and the surgery is performed by an experienced aortic surgical team in a center with established expertise in these procedures. (Class of recommendation IIa; Level of Evidence B-NR).  .   Citation: Donnamarie Poag, Randolm Idol, et al. Surgery for aortic dilatation in patients with bicuspid aortic valves. A statement of clarification from the [employer]/American Heart Association Task Force on Clinical Practice Guidelines. [Published online ahead of print June 13, 2014]. Circulation. doi: 10.1161/CIR.0000000000000331.   Assessment / Plan:    1Possibly  bicuspid Aortic ; severely thickened, severely calcified leaflets. There is at least  moderate aortic stenosis.   There was aortic moderate   regurgitation now with increasing symptoms. 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  Now with increasing symptoms of AS I have recommended proceeding  AVR and replacement of the ascending aorta and coronary artery bypass.  We also discussed surgical replacement of AVR and ascending aorta, including alternatives of mechanical vs tissue valves. Patient prefers tissue valve. Tentative plan September 4.   The goals risks and alternatives of the planned surgical procedure   have been discussed with the patient in detail. The risks of the procedure including death, infection, stroke,heart block,  myocardial infarction, bleeding, blood transfusion, heart block  and need for pacemakerblock have all been discussed specifically.  I have quoted William Contreras a 3 % of perioperative mortality and a complication rate as high as 40%. The patient's questions have been answered.William Contreras is willing  to proceed with the planned procedure.  Grace Isaac MD      [address] [place] [address] Office [phone]   Beeper [phone]  03/14/2017 7:01 AM

## 2017-03-14 NOTE — Progress Notes (Signed)
Pt extubated at 40:81 w/o complications. 4L [place] placed. ABG and mechanics all within parameters per protocol.  Will continue to monitor.

## 2017-03-14 NOTE — Transfer of Care (Signed)
Immediate Anesthesia Transfer of Care Note  Patient: William Contreras  Procedure(s) Performed: Procedure(s) with comments: REPLACEMENT ASCENDING AORTA WITH WHEAT PROCEDURE (N/A) - Using 51mm Hemashield Platinum Woven Double Velour Vascular Graft AORTIC VALVE REPLACEMENT (AVR) (N/A) - Using 70mm Edwards Magna Ease Bioprosthetic Aortic Valve TRANSESOPHAGEAL ECHOCARDIOGRAM (TEE) (N/A) CORONARY ARTERY BYPASS GRAFTING (CABG), ON PUMP, TIMES TWO, USING LEFT INTERNAL MAMMARY ARTERY AND ENDOSCOPICALLY HARVESTED LEFT GREATER SAPHENOUS VEIN (N/A) - LIMA to LAD SVG to RAMUS INTERMEDIATE  Patient Location: SICU  Anesthesia Type:General  Level of Consciousness: sedated and unresponsive  Airway & Oxygen Therapy: Patient remains intubated per anesthesia plan and Patient placed on Ventilator (see vital sign flow sheet for setting)  Post-op Assessment: Report given to RN and Post -op Vital signs reviewed and stable  Post vital signs: Reviewed and stable  Last Vitals:  Vitals:   03/14/17 0558 03/14/17 0601  BP: (!) 136/93   Pulse: (!) 50 (!) 52  Resp: 18   Temp: 36.6 C   SpO2: 100%     Last Pain:  Vitals:   03/14/17 0558  TempSrc: Oral         Complications: No apparent anesthesia complications

## 2017-03-14 NOTE — Progress Notes (Signed)
Patient ID: William Contreras, male   DOB: 1956-04-07, 61 y.o.   MRN: [id number] EVENING ROUNDS NOTE :     [address]       [place] [address]             [phone]                 Day of Surgery Procedure(s) (LRB): REPLACEMENT ASCENDING AORTA WITH WHEAT PROCEDURE (N/A) AORTIC VALVE REPLACEMENT (AVR) (N/A) TRANSESOPHAGEAL ECHOCARDIOGRAM (TEE) (N/A) CORONARY ARTERY BYPASS GRAFTING (CABG), ON PUMP, TIMES TWO, USING LEFT INTERNAL MAMMARY ARTERY AND ENDOSCOPICALLY HARVESTED LEFT GREATER SAPHENOUS VEIN (N/A)  Total Length of Stay:  LOS: 0 days  BP (!) 136/93   Pulse 80   Temp (!) 97.3 F (36.3 C)   Resp 15   SpO2 99%   .Intake/Output      09/03 0701 - 09/04 0700 09/04 0701 - 09/05 0700   I.V.  3500   Blood  1080   IV Piggyback  500   Total Intake   5080   Urine  1975   Blood  1530   Total Output   3505   Net   +1575          . sodium chloride    . [START ON 03/15/2017] sodium chloride    . sodium chloride    . albumin human    . dexmedetomidine (PRECEDEX) IV infusion    . DOPamine    . famotidine (PEPCID) IV    . insulin (NOVOLIN-R) infusion    . lactated ringers    . lactated ringers    . lactated ringers    . [START ON 03/15/2017] levofloxacin (LEVAQUIN) IV    . magnesium sulfate 4 g (03/14/17 1545)  . milrinone    . nitroGLYCERIN    . phenylephrine (NEO-SYNEPHRINE) Adult infusion    . potassium chloride    . vancomycin       Lab Results  Component Value Date   WBC 15.2 (H) 03/14/2017   HGB 13.2 03/14/2017   HCT 36.4 (L) 03/14/2017   PLT 135 (L) 03/14/2017   GLUCOSE 153 (H) 03/14/2017   CHOL 234 (H) 10/21/2016   TRIG 323 (H) 10/21/2016   HDL 43 10/21/2016   LDLCALC 126 (H) 10/21/2016   ALT 20 03/09/2017   AST 21 03/09/2017   NA 136 03/14/2017   K 4.2 03/14/2017   CL 102 03/14/2017   CREATININE 0.80 03/14/2017   BUN 18 03/14/2017   CO2 26 03/09/2017   TSH 0.941 03/09/2015   INR 1.38 03/14/2017   HGBA1C 5.1 03/09/2017   Early  post op still sedated Not bleeding  A paced   Grace Isaac MD  Beeper [phone] Office [phone] 03/14/2017 4:50 PM

## 2017-03-14 NOTE — H&P (Signed)
[address]       [place] [address]             [phone]                    [facility] Medical Record #[id number] Date of Birth: 01/23/1956  Referring:Dr Aundra Dubin Primary Care: Angelina Sheriff, MD  Chief Complaint:    Aortic Stenosis, cad and dilated acending aorta    History of Present Illness:    William Contreras 62 y.o. male followed in the  office  today after recent cardiac cath  by Dr Aundra Dubin Cardiac Cath .   I first saw him in January 2018 after a MRI of the chest. He was seen one month ago after repeat cta of chest. Since January he notes increasing symptoms of shortness of breath with exertion and chest discomfort. He has had no syncope or presyncope.   He presented to cardiology in 02/2015 with acute systolic CHF .  Troponin was mildly elevated to 0.04.  Echo showed EF 30-35%. Cardiac Cath showed diffuse severe disease but the LAD disease was relatively mild .  He had PCI with DES to mLCx and proximal to mid RCA.  Cardiac MRI/MRA chest showed EF 39%, bicuspid aortic valve with probable moderate aortic stenosis, 4.9 cm ascending aorta.  Most recent echo in 4/17 with EF 40-45%, moderate AS/moderate AI, 4.5 cm aortic root.     He has no family history of bicuspid valve, dilated aorta , aortic dissection or sudden unexplaind death of 1st degree relatives. His father died in his 88's in MVA, mother is alive at 40. Has brother and sister without known cardiac disease.     Current Activity/ Functional Status:  Patient is independent with mobility/ambulation, transfers, ADL's, IADL's.   Zubrod Score: At the time of surgery this patient's most appropriate activity status/level should be described as: [x]     0    Normal activity, no symptoms []     1    Restricted in physical strenuous activity but ambulatory, able to do out light work []     2    Ambulatory and capable of self care, unable to do work activities, up and about                >50 % of waking hours                              []     3    Only limited self care, in bed greater than 50% of waking hours []     4    Completely disabled, no self care, confined to bed or chair []     5    Moribund   Past Medical History:  Diagnosis Date  . CHF (congestive heart failure) ([place])   . Coronary artery disease   . Essential hypertension   . GERD (gastroesophageal reflux disease)   . Heart murmur   . Morbid obesity ([place])   . Neuromuscular disorder ([place])    burning in both feet - pt. thinks its related to statin   . Sleep apnea    told that he was borderline, but he has lost 50 lbs & is feeling much better     Past Surgical History:  Procedure Laterality Date  . CARDIAC CATHETERIZATION N/A 03/10/2015   Procedure: Right/Left Heart Cath and Coronary Angiography;  Surgeon:  Larey Dresser, MD;  Location: [facility];  Service: Cardiovascular;  Laterality: N/A;  . CARDIAC CATHETERIZATION N/A 03/10/2015   Procedure: Coronary Stent Intervention;  Surgeon: Leonie Man, MD;  Location: [facility];  Service: Cardiovascular;  Laterality: N/A;  . TONSILLECTOMY      Family History  Problem Relation Age of Onset  . Other Father        died young in [place].  Marland Kitchen Heart attack Mother        alive @ 53.  . Other Brother        A & Contreras  . Other Sister        A & Contreras    Social History   Social History  . Marital status: Married    Spouse name: N/A  . Number of children: N/A  . Years of education: N/A   Occupational History  . Not on file.   Social History Main Topics  . Smoking status: Former Smoker    Packs/day: 1.00    Years: 15.00    Types: Cigarettes  . Smokeless tobacco: Never Used     Comment: quit smoking ~ 20 yrs ago.  . Alcohol use No     Comment: prev drank - none in ~ 25 yrs.  . Drug use: No     Comment: prev smoked MJ, none in 25 yrs.  . Sexual activity: Yes   Other Topics Concern  . Not on file   Social History Narrative   Pt lives  in [place] with his wife and son.  He has 2 step children and another child - all grown and out of the house.    History  Smoking Status  . Former Smoker  . Packs/day: 1.00  . Years: 15.00  . Types: Cigarettes  Smokeless Tobacco  . Never Used    Comment: quit smoking ~ 20 yrs ago.    History  Alcohol Use No    Comment: prev drank - none in ~ 25 yrs.     Allergies  Allergen Reactions  . Lipitor [Atorvastatin] Other (See Comments)    Joint and head pains  . Penicillins Other (See Comments)    Lightheadness Has patient had a PCN reaction causing immediate rash, facial/tongue/throat swelling, SOB or lightheadedness with hypotension:unknown Has patient had a PCN reaction causing severe rash involving mucus membranes or skin necrosis: No Has patient had a PCN reaction that required hospitalization: No Has patient had a PCN reaction occurring within the last 10 years: No If all of the above answers are "NO", then may proceed with Cephalosporin use. Childhood allergy rec'd Amoxicillin with     Current Facility-Administered Medications  Medication Dose Route Frequency Provider Last Rate Last Dose  . cefUROXime (ZINACEF) 1.5 g in dextrose 5 % 50 mL IVPB  1.5 g Intravenous To OR Grace Isaac, MD      . cefUROXime (ZINACEF) 750 mg in dextrose 5 % 50 mL IVPB  750 mg Intravenous To OR Grace Isaac, MD      . chlorhexidine (HIBICLENS) 4 % liquid 2 application  30 mL Topical UD Grace Isaac, MD      . dexmedetomidine (PRECEDEX) 400 MCG/100ML (4 mcg/mL) infusion  0.1-0.7 mcg/kg/hr Intravenous To OR Grace Isaac, MD      . DOPamine (INTROPIN) 800 mg in dextrose 5 % 250 mL (3.2 mg/mL) infusion  0-10 mcg/kg/min Intravenous To OR Grace Isaac, MD      .  EPINEPHrine (ADRENALIN) 4 mg in dextrose 5 % 250 mL (0.016 mg/mL) infusion  0-10 mcg/min Intravenous To OR Grace Isaac, MD      . heparin 2,500 Units, papaverine 30 mg in electrolyte-148 (PLASMALYTE-148)  500 mL irrigation   Irrigation To OR Grace Isaac, MD      . heparin 30,000 units/NS 1000 mL solution for CELLSAVER   Other To OR Grace Isaac, MD      . insulin regular (NOVOLIN R,HUMULIN R) 100 Units in sodium chloride 0.9 % 100 mL (1 Units/mL) infusion   Intravenous To OR Grace Isaac, MD      . magnesium sulfate (IV Push/IM) injection 40 mEq  40 mEq Other To OR Grace Isaac, MD      . metoprolol tartrate (LOPRESSOR) tablet 12.5 mg  12.5 mg Oral Once Grace Isaac, MD      . nitroGLYCERIN 50 mg in dextrose 5 % 250 mL (0.2 mg/mL) infusion  2-200 mcg/min Intravenous To OR Grace Isaac, MD      . phenylephrine (NEO-SYNEPHRINE) 20 mg in sodium chloride 0.9 % 250 mL (0.08 mg/mL) infusion  30-200 mcg/min Intravenous To OR Grace Isaac, MD      . potassium chloride injection 80 mEq  80 mEq Other To OR Grace Isaac, MD      . tranexamic acid (CYKLOKAPRON) 2,500 mg in sodium chloride 0.9 % 250 mL (10 mg/mL) infusion  1.5 mg/kg/hr Intravenous To OR Grace Isaac, MD      . tranexamic acid (CYKLOKAPRON) bolus via infusion - over 30 minutes 1,479 mg  15 mg/kg Intravenous To OR Grace Isaac, MD      . tranexamic acid (CYKLOKAPRON) pump prime solution 197 mg  2 mg/kg Intracatheter To OR Grace Isaac, MD      . vancomycin (VANCOCIN) 1,500 mg in sodium chloride 0.9 % 250 mL IVPB  1,500 mg Intravenous To OR Grace Isaac, MD       Facility-Administered Medications Ordered in Other Encounters  Medication Dose Route Frequency Provider Last Rate Last Dose  . fentaNYL (SUBLIMAZE) injection    Anesthesia Intra-op Clovis Cao, CRNA   100 mcg at 03/14/17 [phone]  . midazolam (VERSED) 5 MG/5ML injection    Anesthesia Intra-op Clovis Cao, CRNA   2 mg at 03/14/17 [id number]      Review of Systems:     Cardiac Review of Systems: Y or N  Chest Pain [  n  ]  Resting SOB [ n  ] Exertional SOB n [  ]  Orthopnea [ n ]   Pedal Edema [ n  ]      Palpitations [ n ] Syncope  [n  ]   Presyncope [  n ]  General Review of Systems: [Y] = yes [  ]=no Constitional: recent weight change [ n ];  Wt loss over the last 3 months [   ] anorexia [  ]; fatigue [  ]; nausea [  ]; night sweats [  ]; fever [  ]; or chills [  ];          Dental: poor dentition[  n]; Last Dentist visit: every 6 months sees dentist   Eye : blurred vision [  ]; diplopia [   ]; vision changes [  ];  Amaurosis fugax[  ]; Resp: cough [ n ];  wheezing[ n ];  hemoptysis[ n ]; shortness of breath[y  ];  paroxysmal nocturnal dyspnea[  ]; dyspnea on exertion[  ]; or orthopnea[  ];  GI:  gallstones[n  ], vomiting[  ];  dysphagia[  ]; melena[  ];  hematochezia [  ]; heartburn[  ];   Hx of  Colonoscopy[  ]; GU: kidney stones [  ]; hematuria[  ];   dysuria [  ];  nocturia[  ];  history of     obstruction [  ]; urinary frequency [  ]             Skin: rash, swelling[  ];, hair loss[  ];  peripheral edema[n  ];  or itching[  ]; Musculosketetal: myalgias[  ];  joint swelling[  ];  joint erythema[  ];  joint pain[  ];  back pain[  ];  Heme/Lymph: bruising[  ];  bleeding[  ];  anemia[  ];  Neuro: TIA[ n ];  headaches[  ];  stroke[  ];  vertigo[  ];  seizures[ n ];   paresthesias[n  ];  difficulty walking[n  ];  Psych:depression[  ]; anxiety[  ];  Endocrine: diabetes[n  ];  thyroid dysfunction[n  ];  Immunizations: Flu up to date William Contreras  ]; Pneumococcal up to date [ n ];  Other:  Physical Exam: BP (!) 136/93   Pulse (!) 52   Temp 97.8 F (36.6 C) (Oral)   Resp 18   SpO2 100%   PHYSICAL EXAMINATION: Physical Exam  Nursing note and vitals reviewed. Constitutional: He is oriented to person, place, and time. He appears well-nourished. No distress.  HENT:  Head: Normocephalic and atraumatic.  Mouth/Throat: No oropharyngeal exudate.  Eyes: Right eye exhibits no discharge. Left eye exhibits no discharge. No scleral icterus.  Neck: Neck supple. No JVD present. No tracheal deviation present. No  thyromegaly present.  Cardiovascular: Normal rate and regular rhythm.  Exam reveals no gallop and no friction rub.   Murmur heard. Respiratory: No stridor. No respiratory distress. He has no wheezes. He has no rales. He exhibits no tenderness.  GI: Soft. Bowel sounds are normal. He exhibits no distension and no mass. There is no tenderness. There is no rebound and no guarding.  Musculoskeletal: Normal range of motion. He exhibits no edema, tenderness or deformity.  Lymphadenopathy:    He has no cervical adenopathy.  Neurological: He is alert and oriented to person, place, and time. He has normal reflexes. No cranial nerve deficit. Coordination normal.  Skin: No rash noted. He is not diaphoretic. No erythema.  Psychiatric: He has a normal mood and affect. His behavior is normal. Thought content normal.  Severe bilateral lower extremity varicosities below the knees   Diagnostic Studies & Laboratory data:     Recent Radiology Findings:   Ct Angio Chest Aorta Contreras &/or Wo Contrast  Result Date: 01/26/2017 CLINICAL DATA:  Pre-op for aneurysm repair and aortic valve replacement. Slight chest pain, SOB on exertion. Fatigue. Nonsmoker over 20 yrs. Heart stents x 2. No hx ca. EXAM: CT ANGIOGRAPHY CHEST WITH CONTRAST TECHNIQUE: Multidetector CT imaging of the chest was performed using the standard protocol during bolus administration of intravenous contrast. Multiplanar CT image reconstructions and MIPs were obtained to evaluate the vascular anatomy. CONTRAST:  75 cc Isovue 370 COMPARISON:  MRA chest dated 06/24/2016. FINDINGS: Cardiovascular: Ascending thoracic aorta measures 4.9 cm diameter. Atherosclerosis at the aortic valve. Aortic arch and descending thoracic aorta are normal in caliber. Heart size is normal. No pericardial effusion. Coronary artery calcifications. Mediastinum/Nodes: No mass or enlarged  lymph nodes within the mediastinum or perihilar regions. Esophagus appears normal. Trachea and  central bronchi are unremarkable. Lungs/Pleura: Lungs are clear.  No pleural effusion or pneumothorax. Upper Abdomen: Limited images of the upper abdomen are unremarkable. Musculoskeletal: Degenerative changes throughout the slightly kyphotic thoracic spine, mild to moderate in degree, with evidence of DISH. No acute or suspicious osseous finding. Review of the MIP images confirms the above findings. IMPRESSION: 1. No acute findings. No aortic dissection. No pneumonia or pulmonary edema. No pericardial effusion. 2. 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 3. Aortic atherosclerosis. 4. Coronary artery calcifications, particularly dense within the left main and left circumflex coronary arteries. Recommend correlation with any possible associated cardiac symptoms. Aortic aneurysm NOS (ICD10-I71.9). Aortic Atherosclerosis (ICD10-I70.0). Electronically Signed   By: Franki Cabot M.D.   On: 01/26/2017 15:40   I have independently reviewed the above radiology studies  and reviewed the findings with the patient.    William Contreras Wo Contrast  Result Date: 06/24/2016 CLINICAL DATA:  Evaluate thoracic aortic aneurysm. EXAM: MRA CHEST WITH CONTRAST TECHNIQUE: Angiographic images of the chest were obtained using MRA technique with intravenous contrast. CONTRAST:  12mL MULTIHANCE GADOBENATE DIMEGLUMINE 529 MG/ML IV SOLN COMPARISON:  Chest MRI - 07/09/2015 FINDINGS: Vascular Findings: Grossly unchanged fusiform aneurysmal dilatation of the ascending thoracic aorta was measurements as follows. The thoracic aorta tapers to a normal caliber at the level of the aortic arch. Bovine configuration of the aortic arch. The branch vessels of the aortic arch remain widely patent throughout their imaged course. Normal heart size.  No pericardial effusion. Although this examination was not  tailored for the evaluation the pulmonary arteries, there are no discrete filling defects within the central pulmonary arterial tree to suggest central pulmonary embolism. Normal caliber of the main pulmonary artery. ------------------------------------------------------------- Thoracic aortic measurements: Sinotubular junction 43 mm as measured in greatest oblique sagittal dimension (image 42, series 1101). Proximal ascending aorta 51 mm as measured in greatest oblique sagittal dimension at the level of the main pulmonary artery (image 35, series 1101) and approximately 50 mm in greatest oblique axial diameter (image 47, series 12), unchanged since the 06/2015 examination. Aortic arch aorta 30 mm as measured in greatest oblique sagittal dimension (image 39, series 1101). Proximal descending thoracic aorta 29 mm as measured in greatest oblique axial dimension at the level of the main pulmonary artery (image 47, series 12). Distal descending thoracic aorta 27 mm as measured in greatest oblique axial dimension at the level of the diaphragmatic hiatus (sagittal image 27, series 1101). Review of the MIP images confirms the above findings. ------------------------------------------------------------- Non-Vascular Findings: Mediastinum/Lymph Nodes: No bulky mediastinal, hilar axillary lymphadenopathy. Lungs/Pleura: No focal airspace opacities.  No pleural effusion. Upper abdomen: Limited visualization of the upper abdomen is unremarkable. Musculoskeletal: Regional osseous and soft tissue structures are unremarkable. Susceptibility artifact involves the medial aspect of the left chest, potentially at a location of a loop recorder device. IMPRESSION: Stable uncomplicated fusiform aneurysmal dilatation of the ascending thoracic aorta measuring 51 mm in greatest diameter, unchanged since the 06/2015 examination. Electronically Signed   By: Sandi Mariscal M.D.   On: 06/24/2016 16:34    CLINICAL DATA:  ?Bicuspid aortic valve,  dilated ascending aorta, ischemic cardiomyopathy  EXAM: CARDIAC MRI  TECHNIQUE: The patient was scanned on a 1.5 Tesla GE magnet. A dedicated cardiac coil was used. Functional imaging was  done using Fiesta sequences. 2,3, and 4 chamber views were done to assess for RWMA's. Modified Simpson's rule using a short axis stack was used to calculate an ejection fraction on a dedicated work Conservation officer, nature. The patient received 35 cc of Multihance. William angiography was done. After 10 minutes inversion recovery sequences were used to assess for infiltration and scar tissue.  CONTRAST:  35 cc Multihance  FINDINGS: Limited images of the lung fields showed no significant abnormalities.  Mildly dilated left ventricle with moderate LV hypertrophy. The inferior, inferolateral, and anterolateral walls are hypokinetic. The calculated LV ejection fraction is 39%. Normal right ventricular size and systolic function. The right and left atrial sizes are normal. Probably mild mitral regurgitation. The aortic valve is bicuspid. There is mild aortic insufficiency. Visually, there appears to be at least moderate aortic stenosis.  On delayed enhancement imaging, there was 26-50% wall thickness subendocardial late gadolinium enhancement (LGE) in a small area of the mid inferior wall. There is < 25% wall thickness subendocardial LGE in a small area of the mid inferolateral wall.  On William angiography, there was dilation of the aortic root (4.1 cm) and ascending aorta (4.9 cm). There was a bovine arch. Pulmonary veins drained normally to the left atrium.  MEASUREMENTS: MEASUREMENTS LV EDV 274 mL  LV SV 108 mL  LV EF 39%  Aortic root 4.1 cm  Ascending aorta 4.9 cm in greatest dimension  Aortic arch 2.8 cm  Descending thoracic aorta 2.7 cm  IMPRESSION: 1. Mildly dillated LV with moderate LV hypertrophy. EF 39% with wall motion abnormalities as above.  2. Bicuspid  aortic valve with associated moderate ascending aorta dilatation to maximal 4.9 cm. The aortic valve is at least moderately stenotic visually, suggest correlation by echo.  3. Small areas of LGE in a coronary disease pattern in the inferior and inferolateral walls as described above.  Dalton Mclean   Electronically Signed   By: Loralie Champagne M.D.   On: 07/10/2015 17:48  I have independently reviewed the above radiology studies  and reviewed the findings with the patient. Cardiac Cath: Procedures   RIGHT HEART CATH AND CORONARY ANGIOGRAPHY  Conclusion   1. Normal filling pressures and preserved cardiac output.  2. Nonobstructive disease in the RCA.  3. Severe diffuse disease in the LCx system involving a moderate ramus and 3 relatively small obtuse marginal branches.  4. Occluded 1st diagonal, likely a moderate vessel.  5. Moderate (50-60%) mid LAD stenosis.  6. 40-50% distal left main stenosis.   Extensive CAD, will need to discuss CABG options with Dr. Servando Snare. Unfortunately, the vessels with the worst disease are relatively small.   Procedural Details/Technique   Technical Details Procedure: Right Heart Cath, Selective Coronary Angiography  Indication: Pre-AVR   Procedural Details: The right brachial and radial areas were prepped, draped, and anesthetized with 1% lidocaine. There was a pre-existing peripheral IV in the right brachial area. This was replaced with a 42F venous sheath. A Swan-Ganz catheter was used for the right heart catheterization. Standard protocol was followed for recording of right heart pressures and sampling of oxygen saturations. Fick cardiac output was calculated. The right radial artery was entered using modified Seldinger technique and a 13F sheath was placed. The patient received 3 mg IA verapamil and weight-based IV heparin. Standard Judkins catheters were used for selective coronary angiography. There were no immediate procedural complications.  The patient was transferred to the post catheterization recovery area for further monitoring.   Estimated  blood loss <50 mL.  During this procedure the patient was administered the following to achieve and maintain moderate conscious sedation: Versed 2 mg, Fentanyl 50 mcg, while the patient's heart rate, blood pressure, and oxygen saturation were continuously monitored. The period of conscious sedation was 43 minutes, of which I was present face-to-face 100% of this time.    Coronary Findings   Dominance: Right  Left Main  40-50% distal left main stenosis.  Left Anterior Descending  50-60% mid LAD stenosis. Moderate D1 appears totally occluded with some late collateral filling.  Ramus Intermedius  70% proximal ramus stenosis (moderate vessel).  Left Circumflex  30% proximal LCx stenosis. Stent in proximal LCx is patent. OM1 is a small-moderate vessel bifurcating early. Suspect OM1 has 80% ostial stenosis with subtotal occlusion of the superior branch of OM1. Small OM2 is subtotally occluded at the ostium. Small to moderate PLOM (OM3) is subtotally occluded at the ostium.  Right Coronary Artery  40% proximal RCA stenosis. Following this, there was a long stented segment in the proximal to mid RCA without significant stenosis. 40% mid RCA stenosis distal to stent. 40% distal RCA stenosis just prior to bifurcation with 50% ostial PLV stenosis and 40% ostial PDA stenosis.  Right Heart   Right Heart Pressures RHC Procedural Findings: Hemodynamics (mmHg) RA mean 5 RV 21/8 PA 18/7, mean 11 PCWP mean 7 AO 97/67  Oxygen saturations: PA 69% AO 100%  Cardiac Output (Fick) 4.75  Cardiac Index (Fick) 2.22         cardiac cath with stents : 03/10/2015 Procedures   Coronary Stent Intervention  Conclusion   1. Prox RCA to Mid RCA lesion, diffuse 50% & focal 80% stenosed. A drug-eluting stent was placed. There is a 0% residual stenosis post intervention. 2. Dist RCA lesion, 50%  stenosed. 3. Prox Cx lesion, 80% stenosed. A drug-eluting stent was placed. There is a 0% residual stenosis post intervention. 4. Ost 2nd Mrg to 2nd Mrg lesion & Ost 3rd Mrg to 3rd Mrg lesion, 100% stenosed. CTO with collateral filling 5. Ramus lesion, 60% stenosed. Mid LAD lesion, 50% stenosed.    Successful 2 vessel PCI involving a very complex/difficult intervention on extensive mid RCA lesion and proximal circumflex with a 90 takeoff of the circumflex.   Plan:  Standard post radial PCI care with TR band removal.  Brachial sheath can be removed 2 hours following discontinuation of Angiomax  Continue dual antiplatelet therapy for minimum one year  Continue aggressive cardiac risk factor modification and CHF treatment.  The patient does have moderate existing ramus intermedius disease which could potentially be a culprit for angina as well as the occluded OM 2 and OM 3 vessels.   ? The ramus is a potential PCI target, however the OM branches are likely not PCI target.  Patient disposition will be based on recommendation of the primary team/heart failure team. He likely requires additional CHF management prior to discharge.   Leonie Man, M.D., M.S. Interventional Cardiologist    ECHO:  Result status: Final result                              *[facility]*  [address] [employer]                        [place], [place] [address]                            [phone]  ------------------------------------------------------------------- Echocardiography  Patient:    Kyrel, Leighton William #:       [id number] Study Date: 10/30/2015 Gender:     M Age:        16 Height:     180.3 cm Weight:     97.7 kg BSA:        2.24 m^2 Pt. Status: Room:   ATTENDING    Loralie Champagne, M.D.  ORDERING     Loralie Champagne, M.D.  REFERRING    Loralie Champagne, M.D.  SONOGRAPHER  Tresa Res, RDCS  PERFORMING   [facility],  Outpatient  cc:  ------------------------------------------------------------------- LV EF: 40% -   45%  ------------------------------------------------------------------- Indications:      CHF - 428.0.  ------------------------------------------------------------------- History:   PMH:   Aortic valve disease.  Risk factors:  Former tobacco use.  ------------------------------------------------------------------- Study Conclusions  - Left ventricle: The cavity size was mildly dilated. There was   mild focal basal hypertrophy of the septum. Systolic function was   mildly to moderately reduced. The estimated ejection fraction was   in the range of 40% to 45%. There is hypokinesis of the   inferolateral myocardium. Doppler parameters are consistent with   abnormal left ventricular relaxation (grade 1 diastolic   dysfunction). - Aortic valve: Possibly bicuspid; severely thickened, severely   calcified leaflets. Valve mobility was restricted. There was   moderate stenosis. There was moderate regurgitation directed   centrally in the LVOT, eccentrically in the LVOT, and towards the   mitral anterior leaflet. Peak velocity (S): 329 cm/s. Mean   gradient (S): 33 mm Hg. Valve area (VTI): 1.42 cm^2. Valve area   (Vmax): 1.22 cm^2. Valve area (Vmean): 1.13 cm^2. - Aorta: Aortic root dimension: 45 mm (ED). - Ascending aorta: The ascending aorta was mildly dilated. - Mitral valve: Calcified annulus. Mildly thickened, mildly   calcified leaflets . There was mild regurgitation. - Left atrium: The atrium was moderately dilated. - Pulmonary arteries: Systolic pressure was mildly increased.  Impressions:  - EF is mildly improved when compared to prior (35%) Prior ECHO   measured aortic root-1mm. Current 83mm. Consider CT or MRI to   obtain more precise measurement.  Echocardiography.  M-mode, complete 2D, spectral Doppler, and color Doppler.  Birthdate:  Patient birthdate:  Mar 08, 1956.  Age:  Patient is 61 yr old.  Sex:  Gender: male.    BMI: 30.1 kg/m^2.  Blood pressure:     128/68  Patient status:  Outpatient.  Study date: Study date: 10/30/2015. Study time: 09:51 AM.  Location:  Echo laboratory.  -------------------------------------------------------------------  ------------------------------------------------------------------- Left ventricle:  The cavity size was mildly dilated. There was mild focal basal hypertrophy of the septum. Systolic function was mildly to moderately reduced. The estimated ejection fraction was in the range of 40% to 45%.  Regional wall motion abnormalities:   There is hypokinesis of the inferolateral myocardium. Doppler parameters are consistent with abnormal left ventricular relaxation (grade 1 diastolic dysfunction).  ------------------------------------------------------------------- Aortic valve:   Possibly bicuspid; severely thickened, severely calcified leaflets. Valve mobility was restricted.  Doppler: There was moderate stenosis.   There was moderate regurgitation directed centrally in the LVOT,  eccentrically in the LVOT, and towards the mitral anterior leaflet.    VTI ratio of LVOT to aortic valve: 0.29. Valve area (VTI): 1.42 cm^2. Indexed valve area (VTI): 0.64 cm^2/m^2. Peak velocity ratio of LVOT to aortic valve: 0.25. Valve area (Vmax): 1.22 cm^2. Indexed valve area (Vmax): 0.55 cm^2/m^2. Mean velocity ratio of LVOT to aortic valve: 0.23. Valve area (Vmean): 1.13 cm^2. Indexed valve area (Vmean): 0.51 cm^2/m^2.    Mean gradient (S): 33 mm Hg. Peak gradient (S): 43 mm Hg.  ------------------------------------------------------------------- Aorta:  Ascending aorta: The ascending aorta was mildly dilated.  ------------------------------------------------------------------- Mitral valve:   Calcified annulus. Mildly thickened, mildly calcified leaflets . Mobility was not restricted.   Doppler: Transvalvular velocity was within the normal range. There was no evidence for stenosis. There was mild regurgitation.    Peak gradient (D): 2 mm Hg.  ------------------------------------------------------------------- Left atrium:  The atrium was moderately dilated.  ------------------------------------------------------------------- Right ventricle:  The cavity size was normal. Wall thickness was normal. Systolic function was normal.  ------------------------------------------------------------------- Pulmonic valve:   Poorly visualized.  Structurally normal valve. Cusp separation was normal.  Doppler:  Transvalvular velocity was within the normal range. There was no evidence for stenosis. There was no regurgitation.  ------------------------------------------------------------------- Tricuspid valve:   Structurally normal valve.    Doppler: Transvalvular velocity was within the normal range. There was trivial regurgitation.  ------------------------------------------------------------------- Pulmonary artery:   The main pulmonary artery was normal-sized. Systolic pressure was mildly increased.  ------------------------------------------------------------------- Right atrium:  The atrium was normal in size.  ------------------------------------------------------------------- Pericardium:  A prominent pericardial fat pad was present. There was no pericardial effusion.  ------------------------------------------------------------------- Systemic veins: Inferior vena cava: The vessel was normal in size.  ------------------------------------------------------------------- Measurements   Left ventricle                            Value          Reference  LV ID, ED, PLAX chordal            (H)    63.9  mm       43 - 52  LV ID, ES, PLAX chordal            (H)    51.3  mm       23 - 38  LV fx shortening, PLAX chordal     (L)    20    %        >=29  LV PW  thickness, ED                       11.3  mm       ---------  IVS/LV PW ratio, ED                       1.09           <=1.3  Stroke volume, 2D                         109   ml       ---------  Stroke volume/bsa, 2D                     49    ml/m^2   ---------  LV ejection fraction, 1-p A4C             56    %        ---------  LV end-diastolic volume, 2-p              190   ml       ---------  LV end-systolic volume, 2-p               105   ml       ---------  LV ejection fraction, 2-p                 45    %        ---------  Stroke volume, 2-p                        85    ml       ---------  LV end-diastolic volume/bsa, 2-p          85    ml/m^2   ---------  LV end-systolic volume/bsa, 2-p           47    ml/m^2   ---------  Stroke volume/bsa, 2-p                    38    ml/m^2   ---------  LV e&', lateral                            4.9   cm/s     ---------  LV E/e&', lateral                          15.45          ---------  LV e&', medial                             5.98  cm/s     ---------  LV E/e&', medial                           12.66          ---------  LV e&', average                            5.44  cm/s     ---------  LV E/e&', average                          13.92          ---------  Longitudinal strain, TDI                  14    %        ---------    Ventricular septum                        Value          Reference  IVS thickness, ED                         12.3  mm       ---------    LVOT                                      Value  Reference  LVOT ID, S                                25    mm       ---------  LVOT area                                 4.91  cm^2     ---------  LVOT peak velocity, S                     82    cm/s     ---------  LVOT mean velocity, S                     56    cm/s     ---------  LVOT VTI, S                               22.2  cm       ---------    Aortic valve                              Value          Reference  Aortic valve  peak velocity, S             329   cm/s     ---------  Aortic valve mean velocity, S             243   cm/s     ---------  Aortic valve VTI, S                       76.8  cm       ---------  Aortic mean gradient, S                   33    mm Hg    ---------  Aortic peak gradient, S                   43    mm Hg    ---------  VTI ratio, LVOT/AV                        0.29           ---------  Aortic valve area, VTI                    1.42  cm^2     ---------  Aortic valve area/bsa, VTI                0.64  cm^2/m^2 ---------  Velocity ratio, peak, LVOT/AV             0.25           ---------  Aortic valve area, peak velocity          1.22  cm^2     ---------  Aortic valve area/bsa, peak               0.55  cm^2/m^2 ---------  velocity  Velocity ratio, mean, LVOT/AV             0.23           ---------  Aortic valve area, mean velocity          1.13  cm^2     ---------  Aortic valve area/bsa, mean               0.51  cm^2/m^2 ---------  velocity    Aorta                                     Value          Reference  Aortic root ID, ED                        45    mm       ---------    Left atrium                               Value          Reference  LA ID, A-P, ES                            49    mm       ---------  LA ID/bsa, A-P                            2.19  cm/m^2   <=2.2  LA volume, ES, 1-p A4C                    47.7  ml       ---------  LA volume/bsa, ES, 1-p A4C                21.3  ml/m^2   ---------  LA volume, ES, 1-p A2C                    100   ml       ---------  LA volume/bsa, ES, 1-p A2C                44.7  ml/m^2   ---------    Mitral valve                              Value          Reference  Mitral E-wave peak velocity               75.7  cm/s     ---------  Mitral A-wave peak velocity               77.1  cm/s     ---------  Mitral deceleration time           (H)    408   ms       150 - 230  Mitral peak gradient, D                   2     mm Hg    ---------   Mitral E/A ratio, peak                    1              ---------    Pulmonary arteries  Value          Reference  PA pressure, S, DP                        30    mm Hg    <=30    Tricuspid valve                           Value          Reference  Tricuspid regurg peak velocity            259   cm/s     ---------  Tricuspid peak RV-RA gradient             27    mm Hg    ---------    Systemic veins                            Value          Reference  Estimated CVP                             3     mm Hg    ---------    Right ventricle                           Value          Reference  RV pressure, S, DP                        30    mm Hg    <=30  RV s&', lateral, S                         18.1  cm/s     ---------  Legend: (L)  and  (H)  mark values outside specified reference range.  ------------------------------------------------------------------- Prepared and Electronically Authenticated by  Candee Furbish, M.D. 2017-04-21T10:59:44     Recent Lab Findings: Lab Results  Component Value Date   WBC 6.4 03/09/2017   HGB 14.8 03/09/2017   HCT 42.6 03/09/2017   PLT 215 03/09/2017   GLUCOSE 108 (H) 03/09/2017   CHOL 234 (H) 10/21/2016   TRIG 323 (H) 10/21/2016   HDL 43 10/21/2016   LDLCALC 126 (H) 10/21/2016   ALT 20 03/09/2017   AST 21 03/09/2017   NA 137 03/09/2017   K 4.2 03/09/2017   CL 103 03/09/2017   CREATININE 0.99 03/09/2017   BUN 16 03/09/2017   CO2 26 03/09/2017   TSH 0.941 03/09/2015   INR 1.00 03/09/2017   HGBA1C 5.1 03/09/2017   Aortic Size Index=    5.0     /There is no height or weight on file to calculate BSA. = 2.27  < 2.75 cm/m2      4% risk per year 2.75 to 4.25          8% risk per year > 4.25 cm/m2    20% risk per year  The [employer] [facility]) and the [place]) have issued a statement to clarify 2 previous guidelines from the [facility], [employer], and collaborating societies addressing the  risk of aortic dissection in patients with bicuspid aortic valves (BAV) and severe aortic enlargement.  The 2 guidelines differ with regard to the recommended threshold of aortic root or ascending aortic dilatation that would justify surgical intervention in patients with bicuspid aortic valves. This new statement of clarification uses the ACC/AHA revised structure for delineating the Class of Recommendation and Level of Evidence to provide recommendation that replace those contained in Section 9.2.2.1 of the thoracic aortic disease guidelines and Section 5.1.3 of the valvular heart disease guideline. New recommendations in intervention in patients with BAV and dilatation of the aortic root (sinuses) or ascending aorta include:  . Operative intervention to repair or replace the aortic root (sinuses) or replace the ascending aorta is indicated in asymptomatic patients with BAV if the diameter of the aortic root or ascending aorta is 5.5 cm or greater. (Class of recommendation 1, Level of evidence B-NR).  Marland Kitchen Operative intervention to repair or replace the aortic root (sinuses) or replace the ascending aorta is reasonable in asymptomatic patients with BAV if the diameter of the aortic root or ascending aorta is 5.0 cm or greater and an additional risk factor for dissection is present or if the patient is at low surgical risk and the surgery is performed by an experienced aortic surgical team in a center with established expertise in these procedures. (Class of recommendation IIa; Level of Evidence B-NR).  .   Citation: Donnamarie Poag, Randolm Idol, et al. Surgery for aortic dilatation in patients with bicuspid aortic valves. A statement of clarification from the [employer]/American Heart Association Task Force on Clinical Practice Guidelines. [Published online ahead of print June 13, 2014]. Circulation. doi: 10.1161/CIR.0000000000000331.   Assessment / Plan:    1Possibly  bicuspid Aortic ; severely thickened, severely calcified leaflets. There is at least  moderate aortic stenosis.   There was aortic moderate   regurgitation now with increasing symptoms. 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  Now with increasing symptoms of AS I have recommended proceeding  AVR and replacement of the ascending aorta and coronary artery bypass.  We also discussed surgical replacement of AVR and ascending aorta, including alternatives of mechanical vs tissue valves. Patient prefers tissue valve. Tentative plan September 4.   The goals risks and alternatives of the planned surgical procedure   have been discussed with the patient in detail. The risks of the procedure including death, infection, stroke,heart block,  myocardial infarction, bleeding, blood transfusion, heart block  and need for pacemakerblock have all been discussed specifically.  I have quoted William Contreras a 3 % of perioperative mortality and a complication rate as high as 40%. The patient's questions have been answered.William Contreras is willing  to proceed with the planned procedure.  Grace Isaac MD      [address] [place] [address] Office [phone]   Beeper [phone]  03/14/2017 7:01 AM

## 2017-03-14 NOTE — Procedures (Signed)
Extubation Procedure Note  Patient Details:   Name: William Contreras DOB: [birth year] MRN: [id number]   Airway Documentation:     Evaluation  O2 sats: stable throughout Complications: No apparent complications Patient did tolerate procedure well. Bilateral Breath Sounds: Clear, Diminished   Yes, pt able to hoarsely vocalize name and cough to clear secretions. Pt had NIF of -32 and VC of 8L. Pt positive for cuff leak before extubation and was placed on 4L humidified nasal cannula.    Virgilio Frees 03/14/2017, 7:53 PM

## 2017-03-15 ENCOUNTER — Encounter (HOSPITAL_COMMUNITY): Payer: Self-pay | Admitting: Cardiothoracic Surgery

## 2017-03-15 ENCOUNTER — Inpatient Hospital Stay (HOSPITAL_COMMUNITY): Payer: BLUE CROSS/BLUE SHIELD

## 2017-03-15 LAB — CREATININE, SERUM: Creatinine, Ser: 0.81 mg/dL (ref 0.61–1.24)

## 2017-03-15 LAB — CBC
HCT: 31.2 % — ABNORMAL LOW (ref 39.0–52.0)
HEMATOCRIT: 33.7 % — AB (ref 39.0–52.0)
HEMOGLOBIN: 10.7 g/dL — AB (ref 13.0–17.0)
HEMOGLOBIN: 11.5 g/dL — AB (ref 13.0–17.0)
MCH: 30.8 pg (ref 26.0–34.0)
MCH: 31.1 pg (ref 26.0–34.0)
MCHC: 34.3 g/dL (ref 30.0–36.0)
MCHC: 34.1 g/dL (ref 30.0–36.0)
MCV: 89.9 fL (ref 78.0–100.0)
MCV: 91.1 fL (ref 78.0–100.0)
Platelets: 126 10*3/uL — ABNORMAL LOW (ref 150–400)
Platelets: 131 10*3/uL — ABNORMAL LOW (ref 150–400)
RBC: 3.47 MIL/uL — ABNORMAL LOW (ref 4.22–5.81)
RBC: 3.7 MIL/uL — ABNORMAL LOW (ref 4.22–5.81)
RDW: 12 % (ref 11.5–15.5)
RDW: 12.2 % (ref 11.5–15.5)
WBC: 10.2 10*3/uL (ref 4.0–10.5)
WBC: 13.2 10*3/uL — ABNORMAL HIGH (ref 4.0–10.5)

## 2017-03-15 LAB — GLUCOSE, CAPILLARY
Glucose-Capillary: 101 mg/dL — ABNORMAL HIGH (ref 65–99)
Glucose-Capillary: 107 mg/dL — ABNORMAL HIGH (ref 65–99)
Glucose-Capillary: 111 mg/dL — ABNORMAL HIGH (ref 65–99)
Glucose-Capillary: 114 mg/dL — ABNORMAL HIGH (ref 65–99)
Glucose-Capillary: 117 mg/dL — ABNORMAL HIGH (ref 65–99)
Glucose-Capillary: 119 mg/dL — ABNORMAL HIGH (ref 65–99)
Glucose-Capillary: 122 mg/dL — ABNORMAL HIGH (ref 65–99)
Glucose-Capillary: 124 mg/dL — ABNORMAL HIGH (ref 65–99)
Glucose-Capillary: 125 mg/dL — ABNORMAL HIGH (ref 65–99)
Glucose-Capillary: 125 mg/dL — ABNORMAL HIGH (ref 65–99)
Glucose-Capillary: 127 mg/dL — ABNORMAL HIGH (ref 65–99)
Glucose-Capillary: 128 mg/dL — ABNORMAL HIGH (ref 65–99)
Glucose-Capillary: 131 mg/dL — ABNORMAL HIGH (ref 65–99)
Glucose-Capillary: 132 mg/dL — ABNORMAL HIGH (ref 65–99)
Glucose-Capillary: 134 mg/dL — ABNORMAL HIGH (ref 65–99)
Glucose-Capillary: 140 mg/dL — ABNORMAL HIGH (ref 65–99)
Glucose-Capillary: 156 mg/dL — ABNORMAL HIGH (ref 65–99)

## 2017-03-15 LAB — POCT I-STAT, CHEM 8
BUN: 21 mg/dL — ABNORMAL HIGH (ref 6–20)
Calcium, Ion: 1.2 mmol/L (ref 1.15–1.40)
Chloride: 100 mmol/L — ABNORMAL LOW (ref 101–111)
Creatinine, Ser: 0.7 mg/dL (ref 0.61–1.24)
Glucose, Bld: 151 mg/dL — ABNORMAL HIGH (ref 65–99)
HCT: 33 % — ABNORMAL LOW (ref 39.0–52.0)
Hemoglobin: 11.2 g/dL — ABNORMAL LOW (ref 13.0–17.0)
Potassium: 4.1 mmol/L (ref 3.5–5.1)
Sodium: 134 mmol/L — ABNORMAL LOW (ref 135–145)
TCO2: 23 mmol/L (ref 22–32)

## 2017-03-15 LAB — BASIC METABOLIC PANEL
Anion gap: 6 (ref 5–15)
BUN: 19 mg/dL (ref 6–20)
CHLORIDE: 107 mmol/L (ref 101–111)
CO2: 22 mmol/L (ref 22–32)
CREATININE: 0.77 mg/dL (ref 0.61–1.24)
Calcium: 7.7 mg/dL — ABNORMAL LOW (ref 8.9–10.3)
GFR calc Af Amer: >60 mL/min (ref >60)
GFR calc non Af Amer: >60 mL/min (ref >60)
GLUCOSE: 121 mg/dL — AB (ref 65–99)
Potassium: 3.7 mmol/L (ref 3.5–5.1)
SODIUM: 135 mmol/L (ref 135–145)

## 2017-03-15 LAB — POCT I-STAT 4, (NA,K, GLUC, HGB,HCT)
Glucose, Bld: 108 mg/dL — ABNORMAL HIGH (ref 65–99)
HCT: 35 % — ABNORMAL LOW (ref 39.0–52.0)
Hemoglobin: 11.9 g/dL — ABNORMAL LOW (ref 13.0–17.0)
Potassium: 4.1 mmol/L (ref 3.5–5.1)
Sodium: 138 mmol/L (ref 135–145)

## 2017-03-15 LAB — COOXEMETRY PANEL
CARBOXYHEMOGLOBIN: 2 % — AB (ref 0.5–1.5)
METHEMOGLOBIN: 1 % (ref 0.0–1.5)
O2 Saturation: 77 %
Total hemoglobin: 10.7 g/dL — ABNORMAL LOW (ref 12.0–16.0)

## 2017-03-15 LAB — TSH: TSH: 0.402 u[IU]/mL (ref 0.350–4.500)

## 2017-03-15 LAB — MAGNESIUM
MAGNESIUM: 2 mg/dL (ref 1.7–2.4)
Magnesium: 2 mg/dL (ref 1.7–2.4)

## 2017-03-15 MED ORDER — INSULIN DETEMIR 100 UNIT/ML ~~LOC~~ SOLN
10.0000 [IU] | Freq: Every day | SUBCUTANEOUS | Status: DC
  Administered 2017-03-16: 10 [IU] via SUBCUTANEOUS
  Filled 2017-03-15 (×2): qty 0.1

## 2017-03-15 MED ORDER — AMIODARONE HCL 200 MG PO TABS
400.0000 mg | ORAL_TABLET | Freq: Two times a day (BID) | ORAL | Status: DC
  Administered 2017-03-15 (×2): 400 mg via ORAL
  Filled 2017-03-15 (×2): qty 2

## 2017-03-15 MED ORDER — POTASSIUM CHLORIDE 10 MEQ/50ML IV SOLN
10.0000 meq | INTRAVENOUS | Status: AC
  Administered 2017-03-15 (×3): 10 meq via INTRAVENOUS
  Filled 2017-03-15 (×3): qty 50

## 2017-03-15 MED ORDER — INSULIN ASPART 100 UNIT/ML ~~LOC~~ SOLN
0.0000 [IU] | SUBCUTANEOUS | Status: DC
  Administered 2017-03-15 – 2017-03-16 (×4): 2 [IU] via SUBCUTANEOUS

## 2017-03-15 MED ORDER — AMIODARONE HCL 200 MG PO TABS: 400.0000 mg | ORAL_TABLET | Freq: Every day | ORAL | Status: DC

## 2017-03-15 MED ORDER — INSULIN DETEMIR 100 UNIT/ML ~~LOC~~ SOLN
10.0000 [IU] | Freq: Once | SUBCUTANEOUS | Status: AC
  Administered 2017-03-15: 10 [IU] via SUBCUTANEOUS
  Filled 2017-03-15: qty 0.1

## 2017-03-15 MED ORDER — AMIODARONE HCL 200 MG PO TABS: 400.0000 mg | ORAL_TABLET | Freq: Two times a day (BID) | ORAL | Status: DC

## 2017-03-15 MED ORDER — AMIODARONE HCL IN DEXTROSE 360-4.14 MG/200ML-% IV SOLN
INTRAVENOUS | Status: AC
  Administered 2017-03-15: 05:00:00
  Filled 2017-03-15: qty 200

## 2017-03-15 MED FILL — Lidocaine HCl IV Inj 20 MG/ML: INTRAVENOUS | Qty: 5 | Status: AC

## 2017-03-15 MED FILL — Electrolyte-R (PH 7.4) Solution: INTRAVENOUS | Qty: 4000 | Status: AC

## 2017-03-15 MED FILL — Sodium Bicarbonate IV Soln 8.4%: INTRAVENOUS | Qty: 50 | Status: AC

## 2017-03-15 MED FILL — Mannitol IV Soln 20%: INTRAVENOUS | Qty: 500 | Status: AC

## 2017-03-15 MED FILL — Sodium Chloride IV Soln 0.9%: INTRAVENOUS | Qty: 3000 | Status: AC

## 2017-03-15 MED FILL — Heparin Sodium (Porcine) Inj 1000 Unit/ML: INTRAMUSCULAR | Qty: 10 | Status: AC

## 2017-03-15 NOTE — Op Note (Signed)
NAMELYLE, NIBLETT NO.:  [id number]  MEDICAL RECORD NO.:  [id number]  LOCATION:  [facility]                         FACILITY:  [facility]  PHYSICIAN:  Lanelle Bal, MD    DATE OF BIRTH:  Nov 23, 1955  DATE OF PROCEDURE:  03/14/2017 DATE OF DISCHARGE:                              OPERATIVE REPORT   PREOPERATIVE DIAGNOSIS:  Aortic stenosis with probable bicuspid aortic valve, dilated ascending aorta, and coronary artery disease.  POSTOPERATIVE DIAGNOSIS:  Aortic stenosis with probable bicuspid aortic valve, dilated ascending aorta, and coronary artery disease.  SURGICAL PROCEDURE:  Aortic valve replacement with pericardial tissue valve, [employer], 25 mm, model 3300TFX, serial [id number]; replacement of ascending aorta with a 32-mm Hemashield woven Dacron graft; coronary artery bypass grafting x2 with the left internal mammary to the left anterior descending and reverse saphenous vein graft to the intermediate coronary artery with left thigh endovascular vein harvesting of the left greater saphenous vein.  FIRST ASSISTANT:  Ellwood Handler, PA.  BRIEF HISTORY:  The patient is a 61 year old male with known coronary occlusive disease, having presented with acute myocardial infarction more than a year ago.  Stents were placed in the right coronary artery and circumflex at that time.  Patient was also noted to have probably a bicuspid aortic valve with at least moderate aortic stenosis and moderate aortic insufficiency.  Initial ejection fraction was depressed. The patient was being followed by Dr. Loralie Champagne.  Followup MRI of the ascending aorta revealed a dilation of the ascending aorta at 5.1 cm.  When the patient was referred for the size of his ascending aorta in January of this year, he was relatively asymptomatic over the past several months, had increasing episodes of shortness of breath and chest discomfort with exertion.  Followup CT scan showed  ascending aorta unchanged from previously.  Echocardiogram confirmed at least moderate- to-severe aortic stenosis with bicuspid calcified aortic valve.  Repeat cardiac catheterization was performed.  The patient's right coronary artery with stents was without significant narrowing.  He had a very small circumflex system with small branches as they arose from the AV groove.  There was a moderately large intermediate vessel with 70% stenosis.  The LAD had some diffuse disease with mid 70% stenosis. Ejection fraction was approximately 40%.  Because of the patient's dilated ascending aorta, bicuspid aortic valve with at least moderate to severe aortic stenosis, symptomatic, surgical replacement of the ascending aorta, coronary artery bypass grafting, and valve replacement were recommended to the patient, who agreed and signed informed consent. After discussion with the patient, he preferred a tissue valve, wanting to avoid a mechanical valve and long-term anticoagulation.  The patient agreed and signed informed consent.  DESCRIPTION OF PROCEDURE:  With Swan-Ganz and arterial line monitors in place, the patient underwent general endotracheal anesthesia without incident.  The skin of the chest and legs was prepped with Betadine and draped in usual sterile manner.  Dr. Orene Desanctis placed TEE probe that confirmed the preoperative evaluation with approximately 40% ejection fraction, a functional bicuspid valve with the opening oriented anteroposteriorly.  There was trace mitral insufficiency.  Appropriate time-out was performed and we first proceeded with a small incision in the right  knee.  The patient had known bilateral lower leg varicosities. With incision at the right knee, the vein appeared very large there.  A similar incision was made on the left.  The vein on the left side appeared slightly smaller and the left greater saphenous vein was harvested endoscopically.  A median sternotomy was  performed.  Left internal mammary artery was dissected down as a pedicle graft.  The distal artery was divided and had good free flow.  The vessel was hydrostatically dilated with heparinized saline.  Pericardium was opened and findings were consistent with preop findings.  The mid ascending aorta was dilated to approximately 5 cm.  This tapered to about 3.8 cm at the takeoff of the innominate.  The patient was systemically heparinized.  The ascending aorta was cannulated just distal to the takeoff of the innominate, giving sufficient room to place a cross- clamp.  Retrograde cardioplegia catheter was placed.  Dual stage venous cannula was placed.  The patient was placed on a cardiopulmonary bypass, 2.4 L/min/m2.  Sites of anastomosis were inspected.  The distal LAD was relatively poor quality vessel.  The small distal circumflex branches as they arose from the AV groove were too small to bypass.  The intermediate vessel was of adequate size and was partially intramyocardial.  A right superior pulmonary vein vent was placed.  An aortic root vent cardioplegia needle was placed.  Aortic cross-clamp was applied.  500 mL of cold blood potassium cardioplegia was administered antegrade.  Additional retrograde cardioplegia was administered, retrograde with diastolic arrest of the heart.  Myocardial septal temperature was monitored throughout the cross-clamp.  We turned our attention first to the coronary grafts.  Heart was elevated.  There was evidence of severe left ventricular hypertrophy.  The intermediate vessel was opened, admitted a 1.5 mm probe distally.  Using a running 7- 0 Prolene, distal anastomosis was performed with a segment of reverse saphenous vein graft.  This vein was somewhat on the large size, but there was excellent flow through the vessel.  Additional cold blood cardioplegia was administered.  We then turned our attention to the left anterior descending coronary artery,  which was opened in the mid to distal 3rd.  The 1.5 mm probe passed proximally and distally.  Using a running 8-0 Prolene, the left internal mammary artery was anastomosed to left anterior descending coronary artery.  The fascia of the pedicle was tacked to the epicardium.  We then turned our attention to the aorta. Approximately, a centimeter above the takeoff of the right coronary artery, aorta was divided.  This gave good exposure of a bileaflet aortic valve, bicuspid, oriented to anteroposterior with the left side cusp being much larger and severely calcified.  The right and left coronary ostia were identified.  At this point, we decided to proceed with aortic valve replacement and supracoronary graft placement.  Aorta was trimmed to approximately the sinotubular ridge.  The valve was excised.  The annulus was decalcified.  Care was taken to remove all loose calcific debris.  The annulus was sized for a 25 pericardial tissue valve.  2-0 Tycron pledgeted sutures were placed circumferentially and used to secure the valve in place.  The valve was seated well.  We then selected a 32-mm Hemashield straight graft and with the felt strips, proximal anastomosis was performed just supracoronary with the running 3-0 Prolene.  The remainder of the aorta up to the cross-clamp was then excised.  The graft was trimmed to the appropriate length and  tapered and the distal anastomosis was performed with felt strips and a running 3-0 Prolene.  With the proximal distal anastomoses completed, we began rewarming the patient.  Using an eye cautery, a single opening was made in the graft for the proximal anastomosis of the graft.  The vein graft was trimmed to appropriate length and anastomosed to the ascending aorta graft.  Intermittently, every 20 minutes, retrograde cardioplegia was administered.  The last dose was warmed cardioplegia.  The bulldog was removed from the mammary artery with rise in  myocardial septal temperature.  The heart was allowed to passively fill and de-air and the proximal vein graft anastomosis was completed.  Aortic cross-clamp was removed with total cross-clamp time of 151 minutes.  The patient spontaneously converted to a sinus rhythm and transiently had episodes of atrial fibrillation.  He was started on amiodarone drip.  Sites anastomosed were inspected.  The vein graft was required a repair stitch because of bleeding.  This was controlled with the body temperature rewarmed to 37 degrees and the patient atrially pacing.  The retrograde cardioplegia catheter was removed.  The vent was removed.  The echo showed good functioning of the valve without perivalvular leak with the patient on low-dose milrinone. He was then ventilated and weaned from cardiopulmonary bypass.  He remained hemodynamically stable and was decannulated in usual fashion. Protamine sulfate was administered with operative field hemostatic.  A left pleural tube and Blake mediastinal drain were left in place. Atrial and ventricular pacing wires had been applied.  A single graft marker was applied.  Pericardium was loosely reapproximated.  Sternum was then closed with #6 stainless steel wire.  Fascia was closed with interrupted 0 Vicryl, running 3-0 Vicryl, and subcutaneous tissue with 4- 0 subcuticular stitch in skin edges.  Dry dressings were applied.  Sponge and needle count was reported as correct at the completion of the procedure.  The patient tolerated the procedure without obvious complication.  No blood products from the blood bank were administered.  Total pump time was 193 minutes.     Lanelle Bal, MD     EG/MEDQ  D:  03/15/2017  T:  03/15/2017  Job:  [id number]  cc:   Loralie Champagne, MD

## 2017-03-15 NOTE — Progress Notes (Signed)
Patient ID: William Contreras, male   DOB: April 11, 1956, 61 y.o.   MRN: [id number] SICU Evening Rounds:  Hemodynamically stable on milrinone 0.15  Urine output ok  CT output low.  CBC    Component Value Date/Time   WBC 13.2 (H) 03/15/2017 1624   RBC 3.70 (L) 03/15/2017 1624   HGB 11.2 (L) 03/15/2017 1637   HCT 33.0 (L) 03/15/2017 1637   PLT 131 (L) 03/15/2017 1624   MCV 91.1 03/15/2017 1624   MCH 31.1 03/15/2017 1624   MCHC 34.1 03/15/2017 1624   RDW 12.2 03/15/2017 1624   BMET    Component Value Date/Time   NA 134 (L) 03/15/2017 1637   K 4.1 03/15/2017 1637   CL 100 (L) 03/15/2017 1637   CO2 22 03/15/2017 0407   GLUCOSE 151 (H) 03/15/2017 1637   BUN 21 (H) 03/15/2017 1637   CREATININE 0.70 03/15/2017 1637   CALCIUM 7.7 (L) 03/15/2017 0407   GFRNONAA >60 03/15/2017 1624   GFRAA >60 03/15/2017 1624

## 2017-03-15 NOTE — Progress Notes (Addendum)
TCTS DAILY ICU PROGRESS NOTE                   [address]            [place] [address]          [phone]   1 Day Post-Op Procedure(s) (LRB): REPLACEMENT ASCENDING AORTA WITH WHEAT PROCEDURE (N/A) AORTIC VALVE REPLACEMENT (AVR) (N/A) TRANSESOPHAGEAL ECHOCARDIOGRAM (TEE) (N/A) CORONARY ARTERY BYPASS GRAFTING (CABG), ON PUMP, TIMES TWO, USING LEFT INTERNAL MAMMARY ARTERY AND ENDOSCOPICALLY HARVESTED LEFT GREATER SAPHENOUS VEIN (N/A)  Total Length of Stay:  LOS: 1 day   Subjective: Feels well, some chest soreness, afib has converted to sinus rhythm in 60's-70's On neo and milrinone  Objective: Vital signs in last 24 hours: Temp:  [97.3 F (36.3 C)-98.6 F (37 C)] 98.4 F (36.9 C) (09/05 0700) Pulse Rate:  [79-81] 79 (09/05 0700) Cardiac Rhythm: Atrial paced (09/05 0000) Resp:  [10-24] 20 (09/05 0700) BP: (86-124)/(58-84) 110/71 (09/05 0700) SpO2:  [91 %-100 %] 97 % (09/05 0700) Arterial Line BP: (80-137)/(50-82) 132/61 (09/05 0700) FiO2 (%):  [40 %-50 %] 40 % (09/04 1912) Weight:  [230 lb 3.2 oz (104.4 kg)-232 lb 2.3 oz (105.3 kg)] 230 lb 3.2 oz (104.4 kg) (09/05 0600)  Filed Weights   03/14/17 2000 03/15/17 0600  Weight: 232 lb 2.3 oz (105.3 kg) 230 lb 3.2 oz (104.4 kg)    Weight change:    Hemodynamic parameters for last 24 hours: PAP: (17-31)/(10-22) 28/16 CO:  [3.6 L/min-6.9 L/min] 6.9 L/min CI:  [1.7 L/min/m2-3.2 L/min/m2] 3.2 L/min/m2  Intake/Output from previous day: 09/04 0701 - 09/05 0700 In: 7384.2 [I.V.:4854.2; Blood:1080; NG/GT:30; IV Piggyback:1350] Out: 1601 [Urine:2760; Blood:1530; Chest Tube:378]  Intake/Output this shift: No intake/output data recorded.  Current Meds: Scheduled Meds: . acetaminophen  1,000 mg Oral Q6H   Or  . acetaminophen (TYLENOL) oral liquid 160 mg/5 mL  1,000 mg Per Tube Q6H  . acidophilus  1 capsule Oral Daily  . aspirin EC  325 mg Oral Daily   Or  . aspirin  324 mg Per Tube Daily  . bisacodyl   10 mg Oral Daily   Or  . bisacodyl  10 mg Rectal Daily  . calcium-vitamin D  1 tablet Oral Daily  . chlorhexidine gluconate (MEDLINE KIT)  15 mL Mouth Rinse BID  . docusate sodium  200 mg Oral Daily  . insulin regular  0-10 Units Intravenous TID WC  . magnesium oxide  400 mg Oral BID  . mouth rinse  15 mL Mouth Rinse BID  . metoCLOPramide (REGLAN) injection  10 mg Intravenous Q6H  . metoprolol tartrate  12.5 mg Oral BID   Or  . metoprolol tartrate  12.5 mg Per Tube BID  . [START ON 03/16/2017] pantoprazole  40 mg Oral Daily  . rosuvastatin  10 mg Oral Daily  . sodium chloride flush  3 mL Intravenous Q12H   Continuous Infusions: . sodium chloride 10 mL/hr at 03/15/17 0300  . sodium chloride    . sodium chloride 10 mL/hr at 03/15/17 0300  . albumin human Stopped (03/15/17 0009)  . dexmedetomidine (PRECEDEX) IV infusion 0.2 mcg/kg/hr (03/15/17 0300)  . DOPamine Stopped (03/14/17 1515)  . famotidine (PEPCID) IV Stopped (03/14/17 1615)  . insulin (NOVOLIN-R) infusion 1.9 Units/hr (03/15/17 0300)  . lactated ringers    . lactated ringers 10 mL/hr at 03/15/17 0300  . lactated ringers 10 mL/hr at 03/15/17 0300  . levofloxacin (LEVAQUIN) IV    .  milrinone 0.3 mcg/kg/min (03/15/17 0540)  . nitroGLYCERIN Stopped (03/14/17 1515)  . phenylephrine (NEO-SYNEPHRINE) Adult infusion 20 mcg/min (03/15/17 0302)   PRN Meds:.sodium chloride, albumin human, fluticasone, lactated ringers, metoprolol tartrate, midazolam, morphine injection, ondansetron (ZOFRAN) IV, oxyCODONE, [START ON 03/18/2017] pneumococcal 23 valent vaccine, sodium chloride flush, traMADol  General appearance: alert, cooperative and no distress Neurologic: intact Heart: regular rate and rhythm and no rub Lungs: dim mildly in bases Abdomen: soft, nontender, + BS Extremities: + edema Wound: dressings CDI  Lab Results: CBC: Recent Labs  03/14/17 2111 03/14/17 2117 03/15/17 0407  WBC 12.5*  --  10.2  HGB 12.3* 11.2* 10.7*    HCT 33.7* 33.0* 31.2*  PLT 131*  --  126*   BMET:  Recent Labs  03/14/17 2117 03/15/17 0407  NA 138 135  K 4.2 3.7  CL 104 107  CO2  --  22  GLUCOSE 135* 121*  BUN 20 19  CREATININE 0.80 0.77  CALCIUM  --  7.7*    CMET: Lab Results  Component Value Date   WBC 10.2 03/15/2017   HGB 10.7 (L) 03/15/2017   HCT 31.2 (L) 03/15/2017   PLT 126 (L) 03/15/2017   GLUCOSE 121 (H) 03/15/2017   CHOL 234 (H) 10/21/2016   TRIG 323 (H) 10/21/2016   HDL 43 10/21/2016   LDLCALC 126 (H) 10/21/2016   ALT 20 03/09/2017   AST 21 03/09/2017   NA 135 03/15/2017   K 3.7 03/15/2017   CL 107 03/15/2017   CREATININE 0.77 03/15/2017   BUN 19 03/15/2017   CO2 22 03/15/2017   TSH 0.941 03/09/2015   INR 1.38 03/14/2017   HGBA1C 5.1 03/09/2017      PT/INR:  Recent Labs  03/14/17 1509  LABPROT 16.9*  INR 1.38   Radiology: Dg Chest Port 1 View  Result Date: 03/14/2017 CLINICAL DATA:  Status post AVR EXAM: PORTABLE CHEST 1 VIEW COMPARISON:  03/09/2017, CT 01/26/2017 FINDINGS: Endotracheal tube tip, slightly deviates the trachea to the right, the tip is visualized about 4.2 cm superior to the carina. Esophageal tube tip in the region of GE junction with side port overlying the distal esophagus. Post sternotomy changes and valve prosthesis. Right IJ Swan-Ganz catheter with tip directed to the right, over the pulmonary confluence. Left-sided chest tube. Low lung volumes. Bandlike atelectasis at the right base. Cardiomegaly with consolidation at the left lung base. No pneumothorax. IMPRESSION: 1. Support lines and tubes as above. Endotracheal tube tip directed to the patient's right and slightly deviates the trachea to the right, the tip is about 4.2 cm superior to the carina 2. Esophageal tube tip overlies the GE junction and the side port projects over distal esophagus, suggest further advancement for more optimal positioning 3. Interval postsurgical changes. Platelike atelectasis at the right base  with consolidation at the left lung base. 4. Cardiomegaly. Electronically Signed   By: Donavan Foil M.D.   On: 03/14/2017 15:29     Assessment/Plan: S/P Procedure(s) (LRB): REPLACEMENT ASCENDING AORTA WITH WHEAT PROCEDURE (N/A) AORTIC VALVE REPLACEMENT (AVR) (N/A) TRANSESOPHAGEAL ECHOCARDIOGRAM (TEE) (N/A) CORONARY ARTERY BYPASS GRAFTING (CABG), ON PUMP, TIMES TWO, USING LEFT INTERNAL MAMMARY ARTERY AND ENDOSCOPICALLY HARVESTED LEFT GREATER SAPHENOUS VEIN (N/A)  1 neurologically  stable overall 2 hemodyn stable, PA pressures excellent, MAP a little low 50's with neo off so turned back on. Most recent CI/CO 3.5/7.5 .  Co-OX 77, Wean milrinone off. HR good, set pacer to AAIat 80. Now on amio for poss afib 3  pulm status improving- O2 sats good on 6 l [place], wean . Push IS routine pulm toilet 4 mod ABL anemia, H/H pretty stable, mild thrombocytopenia, CT drainage is about 30 ml/hr- d/c soon 5 renal fxn is normal- good UOP, will need some diuretic when off mil/neo 6 routine magnesium supplement as needed 7 CBG's good, wean insulin dose off  Contreras,William E 03/15/2017 7:41 AM   I have seen and examined William Contreras and agree with the above assessment  and plan.  Grace Isaac MD Beeper [phone] Office [phone] 03/15/2017 8:16 AM

## 2017-03-15 NOTE — Progress Notes (Signed)
Amiodarone Drug - Drug Interaction Consult Note  Recommendations: -Watch QTc closely while on concurrent levofloxacin -Monitor HR for bradycardia while on metoprolol  Amiodarone is metabolized by the cytochrome P450 system and therefore has the potential to cause many drug interactions. Amiodarone has an average plasma half-life of 50 days (range 20 to 100 days).   There is potential for drug interactions to occur several weeks or months after stopping treatment and the onset of drug interactions may be slow after initiating amiodarone.   []  Statins: Increased risk of myopathy. Simvastatin- restrict dose to 20mg  daily. Other statins: counsel patients to report any muscle pain or weakness immediately.  []  Anticoagulants: Amiodarone can increase anticoagulant effect. Consider warfarin dose reduction. Patients should be monitored closely and the dose of anticoagulant altered accordingly, remembering that amiodarone levels take several weeks to stabilize.  []  Antiepileptics: Amiodarone can increase plasma concentration of phenytoin, the dose should be reduced. Note that small changes in phenytoin dose can result in large changes in levels. Monitor patient and counsel on signs of toxicity.  [x]  Beta blockers: increased risk of bradycardia, AV block and myocardial depression. Sotalol - avoid concomitant use.  []   Calcium channel blockers (diltiazem and verapamil): increased risk of bradycardia, AV block and myocardial depression.  []   Cyclosporine: Amiodarone increases levels of cyclosporine. Reduced dose of cyclosporine is recommended.  []  Digoxin dose should be halved when amiodarone is started.  []  Diuretics: increased risk of cardiotoxicity if hypokalemia occurs.  []  Oral hypoglycemic agents (glyburide, glipizide, glimepiride): increased risk of hypoglycemia. Patient's glucose levels should be monitored closely when initiating amiodarone therapy.   [x]  Drugs that prolong the QT interval:   Torsades de pointes risk may be increased with concurrent use - avoid if possible.  Monitor QTc, also keep magnesium/potassium WNL if concurrent therapy can't be avoided. Marland Kitchen Antibiotics: e.g. fluoroquinolones, erythromycin. . Antiarrhythmics: e.g. quinidine, procainamide, disopyramide, sotalol. . Antipsychotics: e.g. phenothiazines, haloperidol.  . Lithium, tricyclic antidepressants, and methadone.  Thank You,  Arrie Senate, PharmD PGY-2 Cardiology Pharmacy Resident Pager: [phone] 03/15/2017

## 2017-03-16 ENCOUNTER — Inpatient Hospital Stay (HOSPITAL_COMMUNITY): Payer: BLUE CROSS/BLUE SHIELD

## 2017-03-16 LAB — CBC
HCT: 30.8 % — ABNORMAL LOW (ref 39.0–52.0)
Hemoglobin: 10.7 g/dL — ABNORMAL LOW (ref 13.0–17.0)
MCH: 32.1 pg (ref 26.0–34.0)
MCHC: 34.7 g/dL (ref 30.0–36.0)
MCV: 92.5 fL (ref 78.0–100.0)
Platelets: 119 10*3/uL — ABNORMAL LOW (ref 150–400)
RBC: 3.33 MIL/uL — ABNORMAL LOW (ref 4.22–5.81)
RDW: 12.5 % (ref 11.5–15.5)
WBC: 13.5 10*3/uL — ABNORMAL HIGH (ref 4.0–10.5)

## 2017-03-16 LAB — BASIC METABOLIC PANEL
Anion gap: 4 — ABNORMAL LOW (ref 5–15)
BUN: 21 mg/dL — ABNORMAL HIGH (ref 6–20)
CO2: 26 mmol/L (ref 22–32)
Calcium: 8.5 mg/dL — ABNORMAL LOW (ref 8.9–10.3)
Chloride: 102 mmol/L (ref 101–111)
Creatinine, Ser: 0.79 mg/dL (ref 0.61–1.24)
GFR calc Af Amer: >60 mL/min (ref >60)
GFR calc non Af Amer: >60 mL/min (ref >60)
Glucose, Bld: 132 mg/dL — ABNORMAL HIGH (ref 65–99)
Potassium: 4.2 mmol/L (ref 3.5–5.1)
Sodium: 132 mmol/L — ABNORMAL LOW (ref 135–145)

## 2017-03-16 LAB — GLUCOSE, CAPILLARY
Glucose-Capillary: 109 mg/dL — ABNORMAL HIGH (ref 65–99)
Glucose-Capillary: 130 mg/dL — ABNORMAL HIGH (ref 65–99)
Glucose-Capillary: 131 mg/dL — ABNORMAL HIGH (ref 65–99)
Glucose-Capillary: 146 mg/dL — ABNORMAL HIGH (ref 65–99)
Glucose-Capillary: 99 mg/dL (ref 65–99)

## 2017-03-16 LAB — COOXEMETRY PANEL
Carboxyhemoglobin: 1.6 % — ABNORMAL HIGH (ref 0.5–1.5)
Methemoglobin: 1 % (ref 0.0–1.5)
O2 Saturation: 71.5 %
Total hemoglobin: 14.5 g/dL (ref 12.0–16.0)

## 2017-03-16 MED ORDER — BISACODYL 10 MG RE SUPP: 10.0000 mg | Freq: Every day | RECTAL | Status: DC | PRN

## 2017-03-16 MED ORDER — ONDANSETRON HCL 4 MG PO TABS: 4.0000 mg | ORAL_TABLET | Freq: Four times a day (QID) | ORAL | Status: DC | PRN

## 2017-03-16 MED ORDER — ONDANSETRON HCL 4 MG/2ML IJ SOLN: 4.0000 mg | Freq: Four times a day (QID) | INTRAMUSCULAR | Status: DC | PRN

## 2017-03-16 MED ORDER — SODIUM CHLORIDE 0.9 % IV SOLN: 250.0000 mL | INTRAVENOUS | Status: DC | PRN

## 2017-03-16 MED ORDER — INSULIN ASPART 100 UNIT/ML ~~LOC~~ SOLN
0.0000 [IU] | Freq: Three times a day (TID) | SUBCUTANEOUS | Status: DC
  Administered 2017-03-16 (×2): 2 [IU] via SUBCUTANEOUS

## 2017-03-16 MED ORDER — ASPIRIN EC 325 MG PO TBEC
325.0000 mg | DELAYED_RELEASE_TABLET | Freq: Every day | ORAL | Status: DC
  Administered 2017-03-16 – 2017-03-18 (×3): 325 mg via ORAL
  Filled 2017-03-16 (×3): qty 1

## 2017-03-16 MED ORDER — SODIUM CHLORIDE 0.9% FLUSH: 3.0000 mL | INTRAVENOUS | Status: DC | PRN

## 2017-03-16 MED ORDER — OXYCODONE HCL 5 MG PO TABS
5.0000 mg | ORAL_TABLET | ORAL | Status: DC | PRN
  Administered 2017-03-16 – 2017-03-18 (×8): 10 mg via ORAL
  Filled 2017-03-16 (×8): qty 2

## 2017-03-16 MED ORDER — CARVEDILOL 6.25 MG PO TABS
6.2500 mg | ORAL_TABLET | Freq: Two times a day (BID) | ORAL | Status: DC
  Administered 2017-03-16 – 2017-03-18 (×5): 6.25 mg via ORAL
  Filled 2017-03-16 (×5): qty 1

## 2017-03-16 MED ORDER — SODIUM CHLORIDE 0.9% FLUSH
3.0000 mL | Freq: Two times a day (BID) | INTRAVENOUS | Status: DC
  Administered 2017-03-16 – 2017-03-17 (×4): 3 mL via INTRAVENOUS

## 2017-03-16 MED ORDER — INSULIN ASPART 100 UNIT/ML ~~LOC~~ SOLN: 0.0000 [IU] | Freq: Three times a day (TID) | SUBCUTANEOUS | Status: DC

## 2017-03-16 MED ORDER — BISACODYL 5 MG PO TBEC
10.0000 mg | DELAYED_RELEASE_TABLET | Freq: Every day | ORAL | Status: DC | PRN
  Administered 2017-03-16: 10 mg via ORAL
  Filled 2017-03-16: qty 2

## 2017-03-16 MED ORDER — ACETAMINOPHEN 325 MG PO TABS
650.0000 mg | ORAL_TABLET | Freq: Four times a day (QID) | ORAL | Status: DC | PRN
  Administered 2017-03-17: 650 mg via ORAL
  Filled 2017-03-16: qty 2

## 2017-03-16 MED ORDER — MOVING RIGHT ALONG BOOK
Freq: Once | Status: AC
  Administered 2017-03-16: 09:00:00
  Filled 2017-03-16: qty 1

## 2017-03-16 MED ORDER — TRAMADOL HCL 50 MG PO TABS
50.0000 mg | ORAL_TABLET | ORAL | Status: DC | PRN
  Administered 2017-03-18: 50 mg via ORAL
  Filled 2017-03-16: qty 1

## 2017-03-16 MED ORDER — PANTOPRAZOLE SODIUM 40 MG PO TBEC
40.0000 mg | DELAYED_RELEASE_TABLET | Freq: Every day | ORAL | Status: DC
  Administered 2017-03-16 – 2017-03-18 (×3): 40 mg via ORAL
  Filled 2017-03-16 (×3): qty 1

## 2017-03-16 NOTE — Care Management Note (Signed)
Case Management Note  Patient Details  Name: William Contreras MRN: [id number] Date of Birth: 06-Feb-1956  Subjective/Objective:  From home with spouse, pta indep, wife will be at home with him at discharge, POD 2 AVR, CABG, conts on milrinone, co -ox of 71, chest tube removed this am.   He has medication coverage and a PCP.                Action/Plan: NCM will follow for dc needs.   Expected Discharge Date:                  Expected Discharge Plan:     In-House Referral:     Discharge planning Services  CM Consult  Post Acute Care Choice:    Choice offered to:     DME Arranged:    DME Agency:     HH Arranged:    HH Agency:     Status of Service:  In process, will continue to follow  If discussed at Long Length of Stay Meetings, dates discussed:    Additional Comments:  Zenon Mayo, RN 03/16/2017, 11:06 AM

## 2017-03-16 NOTE — Progress Notes (Signed)
Foley removed at 0630. DTV by 12:30.

## 2017-03-16 NOTE — Progress Notes (Addendum)
TCTS DAILY ICU PROGRESS NOTE                   [place]            [place] [address]          [phone]   2 Days Post-Op Procedure(s) (LRB): REPLACEMENT ASCENDING AORTA WITH WHEAT PROCEDURE (N/A) AORTIC VALVE REPLACEMENT (AVR) (N/A) TRANSESOPHAGEAL ECHOCARDIOGRAM (TEE) (N/A) CORONARY ARTERY BYPASS GRAFTING (CABG), ON PUMP, TIMES TWO, USING LEFT INTERNAL MAMMARY ARTERY AND ENDOSCOPICALLY HARVESTED LEFT GREATER SAPHENOUS VEIN (N/A)  Total Length of Stay:  LOS: 2 days   Subjective: Feels well  Objective: Vital signs in last 24 hours: Temp:  [97.6 F (36.4 C)-99.3 F (37.4 C)] 98.6 F (37 C) (09/05 2359) Pulse Rate:  [71-85] 80 (09/06 0700) Cardiac Rhythm: Atrial paced (09/05 2000) Resp:  [12-25] 17 (09/06 0700) BP: (91-132)/(60-80) 123/79 (09/06 0700) SpO2:  [94 %-99 %] 94 % (09/06 0700) Arterial Line BP: (79-137)/(49-73) 121/54 (09/05 1800) Weight:  [231 lb 11.3 oz (105.1 kg)] 231 lb 11.3 oz (105.1 kg) (09/06 0500)  Filed Weights   03/14/17 2000 03/15/17 0600 03/16/17 0500  Weight: 232 lb 2.3 oz (105.3 kg) 230 lb 3.2 oz (104.4 kg) 231 lb 11.3 oz (105.1 kg)    Weight change: -7.1 oz (-0.2 kg)   Hemodynamic parameters for last 24 hours: PAP: (21-41)/(10-23) 32/21  Intake/Output from previous day: 09/05 0701 - 09/06 0700 In: 843.4 [P.O.:180; I.V.:563.4; IV Piggyback:100] Out: 1345 [Urine:935; Chest Tube:410]  Intake/Output this shift: No intake/output data recorded.  Current Meds: Scheduled Meds: . acetaminophen  1,000 mg Oral Q6H   Or  . acetaminophen (TYLENOL) oral liquid 160 mg/5 mL  1,000 mg Per Tube Q6H  . acidophilus  1 capsule Oral Daily  . amiodarone  400 mg Oral Q12H   Followed by  . [START ON 03/22/2017] amiodarone  400 mg Oral Daily  . aspirin EC  325 mg Oral Daily   Or  . aspirin  324 mg Per Tube Daily  . bisacodyl  10 mg Oral Daily   Or  . bisacodyl  10 mg Rectal Daily  . calcium-vitamin D  1 tablet Oral Daily  .  docusate sodium  200 mg Oral Daily  . insulin aspart  0-24 Units Subcutaneous Q4H  . insulin detemir  10 Units Subcutaneous Daily  . magnesium oxide  400 mg Oral BID  . metoprolol tartrate  12.5 mg Oral BID   Or  . metoprolol tartrate  12.5 mg Per Tube BID  . pantoprazole  40 mg Oral Daily  . rosuvastatin  10 mg Oral Daily  . sodium chloride flush  3 mL Intravenous Q12H   Continuous Infusions: . sodium chloride    . dexmedetomidine (PRECEDEX) IV infusion Stopped (03/15/17 0500)  . DOPamine Stopped (03/14/17 1515)  . lactated ringers    . lactated ringers Stopped (03/15/17 0900)  . lactated ringers 10 mL/hr at 03/16/17 0700  . milrinone 0.15 mcg/kg/min (03/16/17 0700)  . nitroGLYCERIN Stopped (03/14/17 1515)  . phenylephrine (NEO-SYNEPHRINE) Adult infusion Stopped (03/15/17 1700)   PRN Meds:.fluticasone, lactated ringers, metoprolol tartrate, midazolam, morphine injection, ondansetron (ZOFRAN) IV, oxyCODONE, [START ON 03/18/2017] pneumococcal 23 valent vaccine, sodium chloride flush, traMADol  General appearance: alert, cooperative and no distress Neurologic: intact Heart: RRR, soft systolic murmur Lungs: dim in bases Abdomen: benign Extremities: + edema- mproved Wound: dressings CDI  Lab Results: CBC: Recent Labs  03/15/17 1624 03/15/17 1637 03/16/17 3419  WBC 13.2*  --  13.5*  HGB 11.5* 11.2* 10.7*  HCT 33.7* 33.0* 30.8*  PLT 131*  --  PENDING   BMET:  Recent Labs  03/15/17 0407  03/15/17 1637 03/16/17 0512  NA 135  --  134* 132*  K 3.7  --  4.1 4.2  CL 107  --  100* 102  CO2 22  --   --  26  GLUCOSE 121*  --  151* 132*  BUN 19  --  21* 21*  CREATININE 0.77  < > 0.70 0.79  CALCIUM 7.7*  --   --  8.5*  < > = values in this interval not displayed.  CMET: Lab Results  Component Value Date   WBC 13.5 (H) 03/16/2017   HGB 10.7 (L) 03/16/2017   HCT 30.8 (L) 03/16/2017   PLT PENDING 03/16/2017   GLUCOSE 132 (H) 03/16/2017   CHOL 234 (H) 10/21/2016   TRIG  323 (H) 10/21/2016   HDL 43 10/21/2016   LDLCALC 126 (H) 10/21/2016   ALT 20 03/09/2017   AST 21 03/09/2017   NA 132 (L) 03/16/2017   K 4.2 03/16/2017   CL 102 03/16/2017   CREATININE 0.79 03/16/2017   BUN 21 (H) 03/16/2017   CO2 26 03/16/2017   TSH 0.402 03/15/2017   INR 1.38 03/14/2017   HGBA1C 5.1 03/09/2017      PT/INR:  Recent Labs  03/14/17 1509  LABPROT 16.9*  INR 1.38   Radiology: No results found.   Assessment/Plan: S/P Procedure(s) (LRB): REPLACEMENT ASCENDING AORTA WITH WHEAT PROCEDURE (N/A) AORTIC VALVE REPLACEMENT (AVR) (N/A) TRANSESOPHAGEAL ECHOCARDIOGRAM (TEE) (N/A) CORONARY ARTERY BYPASS GRAFTING (CABG), ON PUMP, TIMES TWO, USING LEFT INTERNAL MAMMARY ARTERY AND ENDOSCOPICALLY HARVESTED LEFT GREATER SAPHENOUS VEIN (N/A)   1 doing well overall 2 pain is well controlled, some soreness 3 hemodyn stable, junct in 60's under pacer, S Ganz is out, milrinone is only current inotrope/vasodilator. Co-Ox is 71 so can stop 4 490 cc drainage from CT yesterday - poss d/c soon 5 volume overload- should tolerate some diuretic soon, renal fxn normal 6 H/H stable, platelets pending, mild leukocytosis 7 sugars pretty well controlled 130-150's range 8 routine pulm toilet/cardiac rehab-  tx to 4E   Contreras,William E 03/16/2017 7:51 AM   I have seen and examined William Contreras and agree with the above assessment  and plan.  Grace Isaac MD Beeper [phone] Office [phone] 03/16/2017 8:11 AM

## 2017-03-16 NOTE — Progress Notes (Signed)
CARDIAC REHAB PHASE I   Pt just ambulated over from ICU, states he has walked twice today. Encouraged additional ambulation x1 today, will follow up tomorrow.   Lenna Sciara, RN, BSN 03/16/2017 2:04 PM

## 2017-03-17 ENCOUNTER — Encounter (HOSPITAL_COMMUNITY): Payer: BLUE CROSS/BLUE SHIELD | Admitting: Cardiology

## 2017-03-17 LAB — BASIC METABOLIC PANEL
Anion gap: 7 (ref 5–15)
BUN: 25 mg/dL — ABNORMAL HIGH (ref 6–20)
CO2: 27 mmol/L (ref 22–32)
Calcium: 9 mg/dL (ref 8.9–10.3)
Chloride: 96 mmol/L — ABNORMAL LOW (ref 101–111)
Creatinine, Ser: 0.84 mg/dL (ref 0.61–1.24)
GFR calc Af Amer: >60 mL/min (ref >60)
GFR calc non Af Amer: >60 mL/min (ref >60)
Glucose, Bld: 133 mg/dL — ABNORMAL HIGH (ref 65–99)
Potassium: 4.5 mmol/L (ref 3.5–5.1)
Sodium: 130 mmol/L — ABNORMAL LOW (ref 135–145)

## 2017-03-17 LAB — CBC
HCT: 29.7 % — ABNORMAL LOW (ref 39.0–52.0)
Hemoglobin: 10 g/dL — ABNORMAL LOW (ref 13.0–17.0)
MCH: 31.3 pg (ref 26.0–34.0)
MCHC: 33.7 g/dL (ref 30.0–36.0)
MCV: 93.1 fL (ref 78.0–100.0)
Platelets: 123 10*3/uL — ABNORMAL LOW (ref 150–400)
RBC: 3.19 MIL/uL — ABNORMAL LOW (ref 4.22–5.81)
RDW: 12.2 % (ref 11.5–15.5)
WBC: 13 10*3/uL — ABNORMAL HIGH (ref 4.0–10.5)

## 2017-03-17 LAB — GLUCOSE, CAPILLARY
Glucose-Capillary: 116 mg/dL — ABNORMAL HIGH (ref 65–99)
Glucose-Capillary: 110 mg/dL — ABNORMAL HIGH (ref 65–99)
Glucose-Capillary: 112 mg/dL — ABNORMAL HIGH (ref 65–99)

## 2017-03-17 MED ORDER — FUROSEMIDE 20 MG PO TABS
20.0000 mg | ORAL_TABLET | Freq: Every day | ORAL | Status: DC
  Administered 2017-03-17: 20 mg via ORAL
  Filled 2017-03-17 (×2): qty 1

## 2017-03-17 MED ORDER — POTASSIUM CHLORIDE CRYS ER 10 MEQ PO TBCR
10.0000 meq | EXTENDED_RELEASE_TABLET | Freq: Every day | ORAL | Status: DC
  Administered 2017-03-17: 10 meq via ORAL
  Filled 2017-03-17 (×2): qty 1

## 2017-03-17 NOTE — Discharge Instructions (Signed)
Aortic Valve Replacement, Care After °Refer to this sheet in the next few weeks. These instructions provide you with information about caring for yourself after your procedure. Your health care provider may also give you more specific instructions. Your treatment has been planned according to current medical practices, but problems sometimes occur. Call your health care provider if you have any problems or questions after your procedure. °What can I expect after the procedure? °After the procedure, it is common to have: °· Pain around your incision area. °· A small amount of blood or clear fluid coming from your incision. ° °Follow these instructions at home: °Eating and drinking ° °· Follow instructions from your health care provider about eating or drinking restrictions. °? Limit alcohol intake to no more than 1 drink per day for nonpregnant women and 2 drinks per day for men. One drink equals 12 oz of beer, 5 oz of wine, or 1½ oz of hard liquor. °? Limit how much caffeine you drink. Caffeine can affect your heart's rate and rhythm. °· Drink enough fluid to keep your urine clear or pale yellow. °· Eat a heart-healthy diet. This should include plenty of fresh fruits and vegetables. If you eat meat, it should be lean cuts. Avoid foods that are: °? High in salt, saturated fat, or sugar. °? Canned or highly processed. °? Fried. °Activity °· Return to your normal activities as told by your health care provider. Ask your health care provider what activities are safe for you. °· Exercise regularly once you have recovered, as told by your health care provider. °· Avoid sitting for more than 2 hours at a time without moving. Get up and move around at least once every 1-2 hours. This helps to prevent blood clots in the legs. °· Do not lift anything that is heavier than 10 lb (4.5 kg) until your health care provider approves. °· Avoid pushing or pulling things with your arms until your health care provider approves. This  includes pulling on handrails to help you climb stairs. °Incision care ° °· Follow instructions from your health care provider about how to take care of your incision. Make sure you: °? Wash your hands with soap and water before you change your bandage (dressing). If soap and water are not available, use hand sanitizer. °? Change your dressing as told by your health care provider. °? Leave stitches (sutures), skin glue, or adhesive strips in place. These skin closures may need to stay in place for 2 weeks or longer. If adhesive strip edges start to loosen and curl up, you may trim the loose edges. Do not remove adhesive strips completely unless your health care provider tells you to do that. °· Check your incision area every day for signs of infection. Check for: °? More redness, swelling, or pain. °? More fluid or blood. °? Warmth. °? Pus or a bad smell. °Medicines °· Take over-the-counter and prescription medicines only as told by your health care provider. °· If you were prescribed an antibiotic medicine, take it as told by your health care provider. Do not stop taking the antibiotic even if you start to feel better. °Travel °· Avoid airplane travel for as long as told by your health care provider. °· When you travel, bring a list of your medicines and a record of your medical history with you. Carry your medicines with you. °Driving °· Ask your health care provider when it is safe for you to drive. Do not drive until your health   care provider approves.  Do not drive or operate heavy machinery while taking prescription pain medicine. Lifestyle   Do not use any tobacco products, such as cigarettes, chewing tobacco, or e-cigarettes. If you need help quitting, ask your health care provider.  Resume sexual activity as told by your health care provider. Do not use medicines for erectile dysfunction unless your health care provider approves, if this applies.  Work with your health care provider to keep your  blood pressure and cholesterol under control, and to manage any other heart conditions that you have.  Maintain a healthy weight. General instructions  Do not take baths, swim, or use a hot tub until your health care provider approves.  Do not strain to have a bowel movement.  Avoid crossing your legs while sitting down.  Check your temperature every day for a fever. A fever may be a sign of infection.  If you are a woman and you plan to become pregnant, talk with your health care provider before you become pregnant.  Wear compression stockings if your health care provider instructs you to do this. These stockings help to prevent blood clots and reduce swelling in your legs.  Tell all health care providers who care for you that you have an artificial (prosthetic) aortic valve. If you have or have had heart disease or endocarditis, tell all health care providers about these conditions as well.  Keep all follow-up visits as told by your health care provider. This is important. Contact a health care provider if:  You develop a skin rash.  You experience sudden, unexplained changes in your weight.  You have more redness, swelling, or pain around your incision.  You have more fluid or blood coming from your incision.  Your incision feels warm to the touch.  You have pus or a bad smell coming from your incision.  You have a fever. Get help right away if:  You develop chest pain that is different from the pain coming from your incision.  You develop shortness of breath or difficulty breathing.  You start to feel light-headed. These symptoms may represent a serious problem that is an emergency. Do not wait to see if the symptoms will go away. Get medical help right away. Call your local emergency services (911 in the [place] Do not drive yourself to the hospital. This information is not intended to replace advice given to you by your health care provider. Make sure you discuss any  questions you have with your health care provider. Document Released: 01/13/2005 Document Revised: 12/03/2015 Document Reviewed: 05/31/2015 Elsevier Interactive Patient Education  2017 [employer]  Coronary Artery Bypass Grafting, Care After These instructions give you information on caring for yourself after your procedure. Your doctor may also give you more specific instructions. Call your doctor if you have any problems or questions after your procedure. Follow these instructions at home:  Only take medicine as told by your doctor. Take medicines exactly as told. Do not stop taking medicines or start any new medicines without talking to your doctor first.  Take your pulse as told by your doctor.  Do deep breathing as told by your doctor. Use your breathing device (incentive spirometer), if given, to practice deep breathing several times a day. Support your chest with a pillow or your arms when you take deep breaths or cough.  Keep the area clean, dry, and protected where the surgery cuts (incisions) were made. Remove bandages (dressings) only as told by your doctor. If  strips were applied to surgical area, do not take them off. They fall off on their own.  Check the surgery area daily for puffiness (swelling), redness, or leaking fluid.  If surgery cuts were made in your legs: ? Avoid crossing your legs. ? Avoid sitting for long periods of time. Change positions every 30 minutes. ? Raise your legs when you are sitting. Place them on pillows.  Wear stockings that help keep blood clots from forming in your legs (compression stockings).  Only take sponge baths until your doctor says it is okay to take showers. Pat the surgery area dry. Do not rub the surgery area with a washcloth or towel. Do not bathe, swim, or use a hot tub until your doctor says it is okay.  Eat foods that are high in fiber. These include raw fruits and vegetables, whole grains, beans, and nuts. Choose lean meats.  Avoid canned, processed, and fried foods.  Drink enough fluids to keep your pee (urine) clear or pale yellow.  Weigh yourself every day.  Rest and limit activity as told by your doctor. You may be told to: ? Stop any activity if you have chest pain, shortness of breath, changes in heartbeat, or dizziness. Get help right away if this happens. ? Move around often for short amounts of time or take short walks as told by your doctor. Gradually become more active. You may need help to strengthen your muscles and build endurance. ? Avoid lifting, pushing, or pulling anything heavier than 10 pounds (4.5 kg) for at least 6 weeks after surgery.  Do not drive until your doctor says it is okay.  Ask your doctor when you can go back to work.  Ask your doctor when you can begin sexual activity again.  Follow up with your doctor as told. Contact a doctor if:  You have puffiness, redness, more pain, or fluid draining from the incision site.  You have a fever.  You have puffiness in your ankles or legs.  You have pain in your legs.  You gain 2 or more pounds (0.9 kg) a day.  You feel sick to your stomach (nauseous) or throw up (vomit).  You have watery poop (diarrhea). Get help right away if:  You have chest pain that goes to your jaw or arms.  You have shortness of breath.  You have a fast or irregular heartbeat.  You notice a "clicking" in your breastbone when you move.  You have numbness or weakness in your arms or legs.  You feel dizzy or light-headed. This information is not intended to replace advice given to you by your health care provider. Make sure you discuss any questions you have with your health care provider. Document Released: 07/02/2013 Document Revised: 12/03/2015 Document Reviewed: 12/04/2012 Elsevier Interactive Patient Education  2017 [employer].

## 2017-03-17 NOTE — Discharge Summary (Signed)
Physician Discharge Summary  Patient ID: Daelan Gatt MRN: [id number] DOB/AGE: [birth year] 61 y.o.  Admit date: 03/14/2017 Discharge date: 03/18/2017  Admission Diagnoses:Aortic Stenosis, cad and dilated acending aorta   Discharge Diagnoses:  Active Problems:   S/P AVR (aortic valve replacement)   Patient Active Problem List   Diagnosis Date Noted  . S/P AVR (aortic valve replacement) 03/14/2017  . Bicuspid aortic valve 10/09/2015  . Chronic systolic CHF (congestive heart failure) ([place]) 04/23/2015  . Cardiomyopathy, ischemic 03/27/2015  . OSA (obstructive sleep apnea) 03/27/2015  . Stented coronary artery   . Essential hypertension 03/10/2015  . CAD (coronary artery disease) 03/10/2015  . Coronary artery disease involving native coronary artery of native heart with unstable angina pectoris ([place])   . Hyperlipidemia   . Acute systolic CHF (congestive heart failure) ([place]) 03/08/2015    History of Present Illness:    William Contreras 61 y.o. male followed in the  office  today after recent cardiac cath  by Dr Aundra Dubin Cardiac Cath .   I first saw him in January 2018 after a MRI of the chest. He was seen one month ago after repeat cta of chest. Since January he notes increasing symptoms of shortness of breath with exertion and chest discomfort. He has had no syncope or presyncope.   He presented to cardiology in 02/2015 with acute systolic CHF . Troponin was mildly elevated to 0.04. Echo showed EF 30-35%. Cardiac Cath showed diffuse severe disease but the LAD disease was relatively mild . He had PCI with DES to mLCx and proximal to mid RCA. Cardiac MRI/MRA chest showed EF 39%, bicuspid aortic valve with probable moderate aortic stenosis, 4.9 cm ascending aorta. Most recent echo in 4/17 with EF 40-45%, moderate AS/moderate AI, 4.5 cm aortic root.     He has no family history of bicuspid valve, dilated aorta , aortic dissection or sudden unexplaind death of 1st degree  relatives. His father died in his 51's in MVA, mother is alive at 15. Has brother and sister without known cardiac disease.   The patient was admitted for the procedure.   Discharged Condition: good  Hospital Course: The patient was admitted electively for the procedure. On 03/14/2017 he was taken to the operating room where he underwent the below described procedure. He tolerated it well was taken to the surgical intensive care unit in stable condition.  Post operative Hospital course:  The patient has overall done quite well. He was weaned from the ventilator using standard protocols without difficulty. He has remained hemodynamically stable initially requiring some inotropic support which was weaned without difficulty. He is maintained sinus rhythm. He is neurologically intact. All routine lines, monitors and drainage devices have been discontinued in the standard fashion. He does have an expected acute blood loss anemia which is stable. He does have some postoperative volume overload and is diuresing well. Incisions are noted to be healing well without evidence of infection. He is tolerating routine cardiac rehabilitation. Blood sugars are under good control and he has no previous history of diabetes. At time of discharge the patient is felt to be quite stable.  Consults: None  Significant Diagnostic Studies: Routine postoperative laboratory/serial chest x-ray  Treatments: surgery:  DATE OF PROCEDURE:  03/14/2017 DATE OF DISCHARGE:                              OPERATIVE REPORT   PREOPERATIVE DIAGNOSIS:  Aortic stenosis with probable bicuspid  aortic valve, dilated ascending aorta, and coronary artery disease.  POSTOPERATIVE DIAGNOSIS:  Aortic stenosis with probable bicuspid aortic valve, dilated ascending aorta, and coronary artery disease.  SURGICAL PROCEDURE:  Aortic valve replacement with pericardial tissue valve, [employer], 25 mm, model 3300TFX, serial  [id number]; replacement of ascending aorta with a 32-mm Hemashield woven Dacron graft; coronary artery bypass grafting x2 with the left internal mammary to the left anterior descending and reverse saphenous vein graft to the intermediate coronary artery with left thigh endovascular vein harvesting of the left greater saphenous vein.  FIRST ASSISTANT:  Ellwood Handler, PA.  Disposition: 01-Home or Self Care  Discharge Instructions    Amb Referral to Cardiac Rehabilitation    Complete by:  As directed    Diagnosis:   CABG Valve Replacement     Valve:  Aortic   CABG X ___:  2     Allergies as of 03/18/2017      Reactions   Lipitor [atorvastatin] Other (See Comments)   Joint and head pains   Penicillins Other (See Comments)   Lightheadness Has patient had a PCN reaction causing immediate rash, facial/tongue/throat swelling, SOB or lightheadedness with hypotension:unknown Has patient had a PCN reaction causing severe rash involving mucus membranes or skin necrosis: No Has patient had a PCN reaction that required hospitalization: No Has patient had a PCN reaction occurring within the last 10 years: No If all of the above answers are "NO", then may proceed with Cephalosporin use. Childhood allergy rec'd Amoxicillin with       Medication List    STOP taking these medications   lisinopril 2.5 MG tablet Commonly known as:  PRINIVIL,ZESTRIL     TAKE these medications   acetaminophen 325 MG tablet Commonly known as:  TYLENOL Take 2 tablets (650 mg total) by mouth every 6 (six) hours as needed for mild pain.   aspirin 325 MG EC tablet Take 1 tablet (325 mg total) by mouth daily. What changed:  medication strength  how much to take   calcium citrate-vitamin D 315-200 MG-UNIT tablet Commonly known as:  CITRACAL+D Take 1 tablet by mouth daily.   carvedilol 6.25 MG tablet Commonly known as:  COREG TAKE ONE TABLET BY MOUTH TWICE DAILY   CoQ10 200 MG Caps Take 200 mg by mouth  daily. What changed:  how much to take  when to take this   fluticasone 50 MCG/ACT nasal spray Commonly known as:  FLONASE Place 2 sprays into both nostrils daily as needed for allergies or rhinitis.   Magnesium 400 MG Tabs Take 400 mg by mouth 2 (two) times daily.   Oxycodone HCl 10 MG Tabs Take 0.5-1 tablets (5-10 mg total) by mouth every 4 (four) hours as needed for severe pain.   oxymetazoline 0.05 % nasal spray Commonly known as:  AFRIN Place 1 spray into both nostrils at bedtime as needed for congestion.   rosuvastatin 10 MG tablet Commonly known as:  CRESTOR TAKE ONE TABLET BY MOUTH ONCE DAILY What changed:  See the new instructions.   spironolactone 25 MG tablet Commonly known as:  ALDACTONE TAKE 1/2 (ONE-HALF) TABLET BY MOUTH ONCE DAILY   ULTIMATE PROBIOTIC FORMULA Caps Take 1 capsule by mouth daily.            Durable Medical Equipment        Start     Ordered   03/18/17 [phone]  For home use only DME Walker rolling  Once    Question:  Patient needs a walker to treat with the following condition  Answer:  Physical deconditioning   03/18/17 0854       Discharge Care Instructions        Start     Ordered   03/18/17 0000  acetaminophen (TYLENOL) 325 MG tablet  Every 6 hours PRN    Question:  Supervising Provider  Answer:  Grace Isaac   03/18/17 0854   03/18/17 0000  aspirin EC 325 MG EC tablet  Daily     03/18/17 0854   03/18/17 0000  oxyCODONE 10 MG TABS  Every 4 hours PRN    Question:  Supervising Provider  Answer:  Grace Isaac   03/18/17 0854   03/17/17 0000  Amb Referral to Cardiac Rehabilitation    Question Answer Comment  Diagnosis: CABG   Diagnosis: Valve Replacement   Valve: Aortic   CABG X ___ 2      03/17/17 1426     Follow-up Information    Larey Dresser, MD Follow up on 03/31/2017.   Specialty:  Cardiology Why:  Appointment is at 10:30 Contact information: [address] [place]  [address] [phone]        Grace Isaac, MD Follow up.   Specialty:  Cardiothoracic Surgery Why:  Appointment see the surgeon on 04/20/2017 at 10 AM. Please obtain a chest x-ray [facility] at 9:30 AM. [facility] imaging is located in the same office complex. Contact information: [address] [place] [address] [phone]         The patient has been discharged on:   1.Beta Blocker:  Yes [ y  ]                              No   [   ]                              If No, reason:  2.Ace Inhibitor/ARB: Yes [   ]                                     No  [ n   ]                                     If No, reason:BP too low  3.Statin:   Yes [  y ]                  No  [   ]                  If No, reason:  4.Ecasa:  Yes  [ y  ]                  No   [   ]                  If No, reason:   Signed: Janisa Labus 03/18/2017, 9:04 AM

## 2017-03-17 NOTE — Progress Notes (Addendum)
[id number]       [place] [address]             [phone]      3 Days Post-Op Procedure(s) (LRB): REPLACEMENT ASCENDING AORTA WITH WHEAT PROCEDURE (N/A) AORTIC VALVE REPLACEMENT (AVR) (N/A) TRANSESOPHAGEAL ECHOCARDIOGRAM (TEE) (N/A) CORONARY ARTERY BYPASS GRAFTING (CABG), ON PUMP, TIMES TWO, USING LEFT INTERNAL MAMMARY ARTERY AND ENDOSCOPICALLY HARVESTED LEFT GREATER SAPHENOUS VEIN (N/A) Subjective: Feels well  Objective: Vital signs in last 24 hours: Temp:  [98 F (36.7 C)-99.1 F (37.3 C)] 99.1 F (37.3 C) (09/07 0413) Pulse Rate:  [65-79] 68 (09/07 0413) Cardiac Rhythm: Normal sinus rhythm (09/06 1406) Resp:  [12-25] 22 (09/07 0413) BP: (111-140)/(72-92) 138/87 (09/07 0413) SpO2:  [92 %-96 %] 95 % (09/07 0413) Weight:  [231 lb 14.4 oz (105.2 kg)] 231 lb 14.4 oz (105.2 kg) (09/07 0612)  Hemodynamic parameters for last 24 hours:    Intake/Output from previous day: 09/06 0701 - 09/07 0700 In: 508.8 [P.O.:480; I.V.:28.8] Out: 650 [Urine:650] Intake/Output this shift: No intake/output data recorded.  General appearance: alert, cooperative and no distress Heart: regular rate and rhythm and soft systolic murmur Lungs: min dim in bases  Abdomen: benign Extremities: + edema Wound: dressings CDI  Lab Results:  Recent Labs  03/16/17 0512 03/17/17 0346  WBC 13.5* 13.0*  HGB 10.7* 10.0*  HCT 30.8* 29.7*  PLT 119* 123*   BMET:  Recent Labs  03/16/17 0512 03/17/17 0346  NA 132* 130*  K 4.2 4.5  CL 102 96*  CO2 26 27  GLUCOSE 132* 133*  BUN 21* 25*  CREATININE 0.79 0.84  CALCIUM 8.5* 9.0    PT/INR:  Recent Labs  03/14/17 1509  LABPROT 16.9*  INR 1.38   ABG    Component Value Date/Time   PHART 7.378 03/14/2017 2108   HCO3 22.9 03/14/2017 2108   TCO2 23 03/15/2017 1637   ACIDBASEDEF 2.0 03/14/2017 2108   O2SAT 71.5 03/16/2017 0617   CBG (last 3)   Recent Labs  03/16/17 1225 03/16/17 1700 03/17/17 0608  GLUCAP  109* 99 116*    Meds Scheduled Meds: . acidophilus  1 capsule Oral Daily  . aspirin EC  325 mg Oral Daily  . calcium-vitamin D  1 tablet Oral Daily  . carvedilol  6.25 mg Oral BID  . insulin aspart  0-24 Units Subcutaneous TID AC & HS  . insulin detemir  10 Units Subcutaneous Daily  . magnesium oxide  400 mg Oral BID  . pantoprazole  40 mg Oral QAC breakfast  . rosuvastatin  10 mg Oral Daily  . sodium chloride flush  3 mL Intravenous Q12H   Continuous Infusions: . sodium chloride     PRN Meds:.sodium chloride, acetaminophen, bisacodyl **OR** bisacodyl, fluticasone, ondansetron **OR** ondansetron (ZOFRAN) IV, oxyCODONE, [START ON 03/18/2017] pneumococcal 23 valent vaccine, sodium chloride flush, traMADol  Xrays Dg Chest Port 1 View  Result Date: 03/16/2017 CLINICAL DATA:  Chest tube, followup EXAM: PORTABLE CHEST 1 VIEW COMPARISON:  Portable chest x-ray of 03/15/2017 FINDINGS: There is little change in the small left apical pneumothorax with left chest tube remaining. Bibasilar atelectasis remains and cardiomegaly is stable. The Swan-Ganz catheter has been removed with a venous sheath in the SVC IMPRESSION: 1. No change in small left apical pneumothorax with left chest tube remaining. 2. Persistent bibasilar atelectasis. Electronically Signed   By: Ivar Drape M.D.   On: 03/16/2017 08:06    Assessment/Plan: S/P Procedure(s) (  LRB): REPLACEMENT ASCENDING AORTA WITH WHEAT PROCEDURE (N/A) AORTIC VALVE REPLACEMENT (AVR) (N/A) TRANSESOPHAGEAL ECHOCARDIOGRAM (TEE) (N/A) CORONARY ARTERY BYPASS GRAFTING (CABG), ON PUMP, TIMES TWO, USING LEFT INTERNAL MAMMARY ARTERY AND ENDOSCOPICALLY HARVESTED LEFT GREATER SAPHENOUS VEIN (N/A)  1 doing well, hemodyn stable in sinus 2 needs some diuretic 3 H/H stable, platelets improving thrombocytopenia, leukocytosis slightly better, mild 4 sugars well controlled- d/c insulin 5 d/c wires 6 pulm toilet/rehab 7 home 24-48 h if no new issues  LOS: 3 days     GOLD,WAYNE E 03/17/2017  Still on 1 l [place] o2 Walked in hall well this am Wean off o2 as tolerated, poss home sat or Sunday Follow up pa lat chest xray in am  I have seen and examined Daoud Garbett and agree with the above assessment  and plan.  Grace Isaac MD [facility] [phone] Office [phone] 03/17/2017 1:34 PM

## 2017-03-17 NOTE — Progress Notes (Signed)
Ed completed with pt and wife. Good reception. Working on weaning off O2. He is 91-92 2L in recliner. Walked with pt 470 ft again and SAO2 91-92 RA. Encouraged IS, more walking. He would like a RW for home. Will refer to [facility]. [place] CES, ACSM 2:13 PM 03/17/2017

## 2017-03-17 NOTE — Progress Notes (Signed)
EPWs pulled per order and unit protocol.  All tips intact, sites unremarkable, Pt tolerated well.  Pt understands bedrest for one hr with frequent VS.  CCMD notified, will monitor closely.

## 2017-03-17 NOTE — Progress Notes (Signed)
CARDIAC REHAB PHASE I   PRE:  Rate/Rhythm: 64 SR    BP: sitting 95/72    SaO2: 91-92 RA  MODE:  Ambulation: 470 ft   POST:  Rate/Rhythm: 81 first degree then 66 SR    BP: sitting 137/86     SaO2: 100 RA, down to 87 RA when asleep so reapplied O2  Pt moving well. Walked with RW, steady. Only c/o is sleepiness after pain meds. Had run of 1st degree HB at end of walk. SaO2 good walking however once he fell asleep in bed SaO2 87 RA, therefore reapplied 2 1/2L. I will do education with wife this afternoon. [phone]   [place], ACSM 03/17/2017 11:03 AM

## 2017-03-18 ENCOUNTER — Inpatient Hospital Stay (HOSPITAL_COMMUNITY): Payer: BLUE CROSS/BLUE SHIELD

## 2017-03-18 LAB — CBC
HCT: 27.4 % — ABNORMAL LOW (ref 39.0–52.0)
Hemoglobin: 9.3 g/dL — ABNORMAL LOW (ref 13.0–17.0)
MCH: 31.4 pg (ref 26.0–34.0)
MCHC: 33.9 g/dL (ref 30.0–36.0)
MCV: 92.6 fL (ref 78.0–100.0)
Platelets: 148 10*3/uL — ABNORMAL LOW (ref 150–400)
RBC: 2.96 MIL/uL — ABNORMAL LOW (ref 4.22–5.81)
RDW: 12.2 % (ref 11.5–15.5)
WBC: 9.8 10*3/uL (ref 4.0–10.5)

## 2017-03-18 LAB — BASIC METABOLIC PANEL
Anion gap: 7 (ref 5–15)
BUN: 23 mg/dL — ABNORMAL HIGH (ref 6–20)
CO2: 27 mmol/L (ref 22–32)
Calcium: 8.6 mg/dL — ABNORMAL LOW (ref 8.9–10.3)
Chloride: 98 mmol/L — ABNORMAL LOW (ref 101–111)
Creatinine, Ser: 0.89 mg/dL (ref 0.61–1.24)
GFR calc Af Amer: >60 mL/min (ref >60)
GFR calc non Af Amer: >60 mL/min (ref >60)
Glucose, Bld: 120 mg/dL — ABNORMAL HIGH (ref 65–99)
Potassium: 3.6 mmol/L (ref 3.5–5.1)
Sodium: 132 mmol/L — ABNORMAL LOW (ref 135–145)

## 2017-03-18 LAB — GLUCOSE, CAPILLARY
Glucose-Capillary: 115 mg/dL — ABNORMAL HIGH (ref 65–99)
Glucose-Capillary: 128 mg/dL — ABNORMAL HIGH (ref 65–99)

## 2017-03-18 MED ORDER — ACETAMINOPHEN 325 MG PO TABS: 650.0000 mg | ORAL_TABLET | Freq: Four times a day (QID) | ORAL | Status: AC | PRN

## 2017-03-18 MED ORDER — FUROSEMIDE 40 MG PO TABS: 40.0000 mg | ORAL_TABLET | Freq: Every day | ORAL | Status: DC

## 2017-03-18 MED ORDER — OXYCODONE HCL 10 MG PO TABS: 5.0000 mg | ORAL_TABLET | ORAL | 0 refills | Status: DC | PRN

## 2017-03-18 MED ORDER — POTASSIUM CHLORIDE CRYS ER 20 MEQ PO TBCR: 20.0000 meq | EXTENDED_RELEASE_TABLET | Freq: Every day | ORAL | Status: DC

## 2017-03-18 MED ORDER — FUROSEMIDE 10 MG/ML IJ SOLN
40.0000 mg | Freq: Once | INTRAMUSCULAR | Status: AC
  Administered 2017-03-18: 40 mg via INTRAVENOUS
  Filled 2017-03-18: qty 4

## 2017-03-18 MED ORDER — POTASSIUM CHLORIDE CRYS ER 20 MEQ PO TBCR
20.0000 meq | EXTENDED_RELEASE_TABLET | Freq: Once | ORAL | Status: AC
  Administered 2017-03-18: 20 meq via ORAL
  Filled 2017-03-18: qty 1

## 2017-03-18 MED ORDER — ASPIRIN 325 MG PO TBEC: 325.0000 mg | DELAYED_RELEASE_TABLET | Freq: Every day | ORAL | 0 refills | Status: DC

## 2017-03-18 MED ORDER — SPIRONOLACTONE 25 MG PO TABS: 12.5000 mg | ORAL_TABLET | Freq: Every day | ORAL | Status: DC

## 2017-03-18 NOTE — Care Management Note (Signed)
Case Management Note  Patient Details  Name: William Contreras MRN: [id number] Date of Birth: [birth year]  Subjective/Objective:                 Patient with order to DC to home. RW to be delivered to room today prior to DC. No other DC needs identified.    Action/Plan:   Expected Discharge Date:  03/18/17               Expected Discharge Plan:  Home/Self Care  In-House Referral:     Discharge planning Services  CM Consult  Post Acute Care Choice:  Durable Medical Equipment Choice offered to:     DME Arranged:  Walker rolling DME Agency:  [place]:    [facility] Agency:     Status of Service:  Completed, signed off  If discussed at [place] of Stay Meetings, dates discussed:    Additional Comments:  Carles Collet, RN 03/18/2017, 9:17 AM

## 2017-03-18 NOTE — Progress Notes (Signed)
Cardiac Rehab went to ambulate with pt; pt stated he wasn't feeling well. Pressures in the 80's. PA made aware, IV lasix given this AM, will monitor the pt til after lunch and re-assess. Rolling walker at bedside. Call bell and phone within reach. Will continue to monitor.

## 2017-03-18 NOTE — Progress Notes (Signed)
CT sutures removed per order and per protocol. Pt educated that he can shower once he gets home, but not to remove the steri-strips. Pt tolerated well, call bell and phone within reach. Will continue to monitor.

## 2017-03-18 NOTE — Progress Notes (Addendum)
      [address]       [place] [address]             [phone]      4 Days Post-Op Procedure(s) (LRB): REPLACEMENT ASCENDING AORTA WITH WHEAT PROCEDURE (N/A) AORTIC VALVE REPLACEMENT (AVR) (N/A) TRANSESOPHAGEAL ECHOCARDIOGRAM (TEE) (N/A) CORONARY ARTERY BYPASS GRAFTING (CABG), ON PUMP, TIMES TWO, USING LEFT INTERNAL MAMMARY ARTERY AND ENDOSCOPICALLY HARVESTED LEFT GREATER SAPHENOUS VEIN (N/A)   Subjective:  William Contreras has no complaints.  He feels pretty good and would like to go home today.  Objective: Vital signs in last 24 hours: Temp:  [97.7 F (36.5 C)-98.6 F (37 C)] 98.6 F (37 C) (09/08 0418) Pulse Rate:  [65-70] 65 (09/08 0418) Cardiac Rhythm: Bundle branch block;Heart block (09/08 0732) Resp:  [11-23] 11 (09/08 0418) BP: (96-123)/(66-79) 107/73 (09/08 0418) SpO2:  [90 %-93 %] 90 % (09/08 0418) Weight:  [229 lb 4.8 oz (104 kg)] 229 lb 4.8 oz (104 kg) (09/08 0418)  Intake/Output from previous day: 09/07 0701 - 09/08 0700 In: 480 [P.O.:480] Out: 1000 [Urine:1000]  General appearance: alert, cooperative and no distress Heart: regular rate and rhythm, + systolic blowing murmur Lungs: clear to auscultation bilaterally Abdomen: soft, non-tender; bowel sounds normal; no masses,  no organomegaly Extremities: edema trace Wound: clean and dry  Lab Results:  Recent Labs  03/17/17 0346 03/18/17 0320  WBC 13.0* 9.8  HGB 10.0* 9.3*  HCT 29.7* 27.4*  PLT 123* 148*   BMET:  Recent Labs  03/17/17 0346 03/18/17 0320  NA 130* 132*  K 4.5 3.6  CL 96* 98*  CO2 27 27  GLUCOSE 133* 120*  BUN 25* 23*  CREATININE 0.84 0.89  CALCIUM 9.0 8.6*    PT/INR: No results for input(s): LABPROT, INR in the last 72 hours. ABG    Component Value Date/Time   PHART 7.378 03/14/2017 2108   HCO3 22.9 03/14/2017 2108   TCO2 23 03/15/2017 1637   ACIDBASEDEF 2.0 03/14/2017 2108   O2SAT 71.5 03/16/2017 0617   CBG (last 3)   Recent Labs   03/17/17 1105 03/17/17 2109 03/18/17 0620  GLUCAP 110* 112* 115*    Assessment/Plan: S/P Procedure(s) (LRB): REPLACEMENT ASCENDING AORTA WITH WHEAT PROCEDURE (N/A) AORTIC VALVE REPLACEMENT (AVR) (N/A) TRANSESOPHAGEAL ECHOCARDIOGRAM (TEE) (N/A) CORONARY ARTERY BYPASS GRAFTING (CABG), ON PUMP, TIMES TWO, USING LEFT INTERNAL MAMMARY ARTERY AND ENDOSCOPICALLY HARVESTED LEFT GREATER SAPHENOUS VEIN (N/A)  1. CV- NSR, BP controlled- continue Coreg,  2. Pulm- no acute issues, successfully weaned off oxygen yesterday... CXR with small bilateral pleural effusions 3. Renal- creatinine stable, weight is trending down.will give Lasix today, then resume home Spironolactone 4. Hypokalemia- mild at 3.6, will  Supplement with dose of Lasix today,  then d/c as on Spironolactone 5. Dispo- patient stable, will d/c home today, patient request rolling walker   LOS: 4 days    William Contreras, William Contreras 03/18/2017

## 2017-03-18 NOTE — Progress Notes (Signed)
CARDIAC REHAB PHASE I   PRE:  Rate/Rhythm: 81 ?NSR with PACs  BP:  Sitting: 85/65 right/ 89/68 reck      SaO2: 90 RA at rest  MODE:  Ambulation: 0 ft   POST:  Rate/Rhythm: na  BP:  Sitting: na     SaO2: na  1020-1038 In to walk with patient. Patient sitting in chair. "Tired". BP documented as above. Spoke with primary RN. Patient received lasix this am. Also noted change in patients heart rhythm from previous documentation. Per cardiologist patient in type of HB. No ambulation with patient. Primary RN notified CVTS PA. Discharge on hold. Patient denied need for discharge education reinforcement. CR RV encouraged and observed patient using IS. Able to return demonstration appropriately. Referral to CR phase 2 and education already in place.  Irelyn Perfecto English PayneRN, BSN 03/18/2017 10:43 AM

## 2017-03-20 NOTE — Anesthesia Postprocedure Evaluation (Signed)
Anesthesia Post Note  Patient: William Contreras  Procedure(s) Performed: Procedure(s) (LRB): REPLACEMENT ASCENDING AORTA WITH WHEAT PROCEDURE (N/A) AORTIC VALVE REPLACEMENT (AVR) (N/A) TRANSESOPHAGEAL ECHOCARDIOGRAM (TEE) (N/A) CORONARY ARTERY BYPASS GRAFTING (CABG), ON PUMP, TIMES TWO, USING LEFT INTERNAL MAMMARY ARTERY AND ENDOSCOPICALLY HARVESTED LEFT GREATER SAPHENOUS VEIN (N/A)     Patient location during evaluation: PACU Anesthesia Type: General Level of consciousness: awake and alert Pain management: pain level controlled Vital Signs Assessment: post-procedure vital signs reviewed and stable Respiratory status: spontaneous breathing, nonlabored ventilation, respiratory function stable and patient connected to nasal cannula oxygen Cardiovascular status: blood pressure returned to baseline and stable Postop Assessment: no signs of nausea or vomiting Anesthetic complications: no    Last Vitals:  Vitals:   03/17/17 2015 03/18/17 0418  BP: 122/77 107/73  Pulse: 70 65  Resp: (!) 23 11  Temp: 36.7 C 37 C  SpO2: 93% 90%    Last Pain:  Vitals:   03/18/17 0428  TempSrc:   PainSc: Asleep                 Lizza Huffaker,JAMES TERRILL

## 2017-03-24 NOTE — Addendum Note (Signed)
Addendum  created 03/24/17 8335 by Rica Koyanagi, MD   Sign clinical note

## 2017-03-31 ENCOUNTER — Ambulatory Visit (HOSPITAL_COMMUNITY)
Admission: RE | Admit: 2017-03-31 | Discharge: 2017-03-31 | Disposition: A | Discharge status: Home / Self Care | Payer: BLUE CROSS/BLUE SHIELD | Source: Ambulatory Visit | Attending: Cardiology | Admitting: Cardiology

## 2017-03-31 VITALS — BP 122/72 | HR 69 | Wt 208.2 lb

## 2017-03-31 DIAGNOSIS — I110 Hypertensive heart disease with heart failure: Secondary | ICD-10-CM | POA: Insufficient documentation

## 2017-03-31 DIAGNOSIS — Q231 Congenital insufficiency of aortic valve: Secondary | ICD-10-CM | POA: Insufficient documentation

## 2017-03-31 DIAGNOSIS — I25110 Atherosclerotic heart disease of native coronary artery with unstable angina pectoris: Secondary | ICD-10-CM | POA: Diagnosis not present

## 2017-03-31 DIAGNOSIS — I5022 Chronic systolic (congestive) heart failure: Secondary | ICD-10-CM | POA: Diagnosis not present

## 2017-03-31 DIAGNOSIS — I255 Ischemic cardiomyopathy: Secondary | ICD-10-CM | POA: Diagnosis not present

## 2017-03-31 DIAGNOSIS — Z79899 Other long term (current) drug therapy: Secondary | ICD-10-CM | POA: Diagnosis not present

## 2017-03-31 DIAGNOSIS — Z9889 Other specified postprocedural states: Secondary | ICD-10-CM | POA: Diagnosis not present

## 2017-03-31 DIAGNOSIS — Z8249 Family history of ischemic heart disease and other diseases of the circulatory system: Secondary | ICD-10-CM | POA: Insufficient documentation

## 2017-03-31 DIAGNOSIS — I252 Old myocardial infarction: Secondary | ICD-10-CM | POA: Insufficient documentation

## 2017-03-31 DIAGNOSIS — Z87891 Personal history of nicotine dependence: Secondary | ICD-10-CM | POA: Diagnosis not present

## 2017-03-31 DIAGNOSIS — E785 Hyperlipidemia, unspecified: Secondary | ICD-10-CM | POA: Insufficient documentation

## 2017-03-31 DIAGNOSIS — Z7982 Long term (current) use of aspirin: Secondary | ICD-10-CM | POA: Insufficient documentation

## 2017-03-31 DIAGNOSIS — I2510 Atherosclerotic heart disease of native coronary artery without angina pectoris: Secondary | ICD-10-CM | POA: Diagnosis not present

## 2017-03-31 DIAGNOSIS — E782 Mixed hyperlipidemia: Secondary | ICD-10-CM

## 2017-03-31 DIAGNOSIS — Z951 Presence of aortocoronary bypass graft: Secondary | ICD-10-CM | POA: Diagnosis not present

## 2017-03-31 DIAGNOSIS — I7121 Aneurysm of the ascending aorta, without rupture: Secondary | ICD-10-CM

## 2017-03-31 DIAGNOSIS — I712 Thoracic aortic aneurysm, without rupture: Secondary | ICD-10-CM | POA: Diagnosis not present

## 2017-03-31 LAB — BASIC METABOLIC PANEL
Anion gap: 9 (ref 5–15)
BUN: 14 mg/dL (ref 6–20)
CALCIUM: 9.4 mg/dL (ref 8.9–10.3)
CO2: 25 mmol/L (ref 22–32)
CREATININE: 0.93 mg/dL (ref 0.61–1.24)
Chloride: 101 mmol/L (ref 101–111)
GFR calc non Af Amer: >60 mL/min (ref >60)
Glucose, Bld: 97 mg/dL (ref 65–99)
Potassium: 4.4 mmol/L (ref 3.5–5.1)
Sodium: 135 mmol/L (ref 135–145)

## 2017-03-31 LAB — CBC
HCT: 35 % — ABNORMAL LOW (ref 39.0–52.0)
Hemoglobin: 11.8 g/dL — ABNORMAL LOW (ref 13.0–17.0)
MCH: 31 pg (ref 26.0–34.0)
MCHC: 33.7 g/dL (ref 30.0–36.0)
MCV: 91.9 fL (ref 78.0–100.0)
PLATELETS: 452 10*3/uL — AB (ref 150–400)
RBC: 3.81 MIL/uL — AB (ref 4.22–5.81)
RDW: 12 % (ref 11.5–15.5)
WBC: 7.5 10*3/uL (ref 4.0–10.5)

## 2017-03-31 MED ORDER — SPIRONOLACTONE 25 MG PO TABS: 25.0000 mg | ORAL_TABLET | Freq: Every day | ORAL | 3 refills | Status: DC

## 2017-03-31 NOTE — Progress Notes (Signed)
Advanced Heart Failure Medication Review by a Pharmacist  Does the patient  feel that his/her medications are working for him/her?  yes  Has the patient been experiencing any side effects to the medications prescribed?  no  Does the patient measure his/her own blood pressure or blood glucose at home?  no   Does the patient have any problems obtaining medications due to transportation or finances?   no  Understanding of regimen: good Understanding of indications: good Potential of compliance: good Patient understands to avoid NSAIDs. Patient understands to avoid decongestants.  Issues to address at subsequent visits: None   Pharmacist comments: William Contreras is a pleasant 61 yo M presenting with his wife and without a medication list but with good recall of his regimen. He reports good compliance with his regimen and did not have any specific medication-related questions or concerns for me at this time.   Ruta Hinds. Velva Harman, PharmD, BCPS, CPP Clinical Pharmacist Pager: [phone] Phone: [phone] 03/31/2017 10:39 AM      Time with patient: 10 minutes Preparation and documentation time: 2 mintues Total time: 12 minutes

## 2017-03-31 NOTE — Patient Instructions (Addendum)
INCREASE Spironolactone to 25mg , one tab daily  Labs today We will only contact you if something comes back abnormal or we need to make some changes. Otherwise no news is good news!   You have been referred to [facility] Address: [address], [place], [place] [address] Phone: [phone]   Your physician recommends that you schedule a follow-up appointment in: 2 months with Dr.McLean  Do the following things EVERYDAY: 1) Weigh yourself in the morning before breakfast. Write it down and keep it in a log. 2) Take your medicines as prescribed 3) Eat low salt foods-Limit salt (sodium) to 2000 mg per day.  4) Stay as active as you can everyday 5) Limit all fluids for the day to less than 2 liters

## 2017-03-31 NOTE — Progress Notes (Signed)
Patient ID: William Contreras, male   DOB: [birth year], 61 y.o.   MRN: [id number] PCP: Dr. Lin Landsman Cardiology: Dr. Aundra Dubin  61 yo with history of HTN was admitted in 8/16 with acute systolic CHF => volume overloaded, short of breath.  Troponin was mildly elevated to 0.04.  Echo showed EF 30-35%.  He was diuresed and had cardiac cath.  This showed diffuse severe disease but the LAD disease was relatively mild to CABG not recommended.  He had PCI with DES to mLCx and proximal to mid RCA.  Cardiac MRI/MRA chest showed EF 39%, bicuspid aortic valve with probable moderate aortic stenosis, 4.9 cm ascending aorta.  MRA in 12/17 showed ascending aorta dimension up to 5.1 cm.  He saw Dr. Servando Snare for aorta evaluation.  EF 45% with inferolateral hypokinesis, moderate AS with mean gradient 38 mmHg and AVA 1.3 cm^2, mild to moderate aortic insufficiency, bicuspid aortic valve, ascending aorta 5.1 cm.   Admitted 03/14/17-03/18/17 for aortic valve replacement. Tolerated well.   Returns today for HF follow up. Walking on the treadmill about 15 min a day, no SOB. Chest is sore from surgery. Taking all medications. Eating low sodium, drinking less than 2L a day. Denies chest pain, orthopnea, palpitations, and PND. Says that his right hand has been numb since surgery, no dizziness, confusion or gait abnormalities.   Labs (8/16): K 4.1, creatinine 1.0, HCT 42.6, LDL 200 Labs (9/16): K 4, creatinine 1.04, BNP 383 Labs (11/16): K 4.1, creatinine 0.99 Labs (12/16): LDL 152, HDL 37 (not taking Crestor) Labs (3/17): LDL 92, HDL 36, K 4.1, creatinine 0.92 Labs (6/17): LDL 83, HDL 38, K 3.8, creatinine 1.04 Labs (11/17): creatinine 0.94  PMH: 1. HTN 2. CAD: NSTEMI/CHF in 8/16.  LHC (8/16) showed 40-50% mLAD, 50% prox ramus, 80% mLCx, total occlusion moderate OM2, 50% pRCA, 80% mRCA, 50% dRCA.  He had DES to prox and mid RCA and mid LCx.   3. Chronic systolic CHF: Ischemic cardiomyopathy.   - Echo (8/16) with EF 30-35%, diffuse  hypokinesis, mild LVH, ?bicuspid aortic valve with mild AS and mild AI, 5.1 cm aortic root.  - Cardiac MRI (12/16): EF 39%, mild LV dilation and moderate LVH, inferior/inferolateral/anterolateral hypokinesis, LGE in the inferior and inferolateral walls, normal RV size and systolic function, mild aortic insufficiency, bicuspid aortic valve with probable moderate aortic stenosis.  - Echo (4/17): EF 40-45%, inferolateral hypokinesis, bicuspid aortic valve with moderate aortic stenosis (mean gradient 33 mmHg), moderate AI, 4.5 cm aortic root. - Echo (4/18): EF 45% with inferolateral hypokinesis, moderate AS with mean gradient 38 mmHg and AVA 1.3 cm^2, mild to moderate aortic insufficiency, bicuspid aortic valve, ascending aorta 5.1 cm.  4. Hyperlipidemia 5. Bicuspid aortic valve: Moderate AS by 12/16 MRI, mild AS by 8/16 echo.  Moderate AS/moderate AI by 4/17 echo.  - Echo (4/18) with moderate AS and mild to moderate AI.  Mean AoV gradient 38 mmHg with AVA 1.3 cm^2.  6. Ascending aortic aneurysm: 4.9 cm ascending aorta by MRA chest in 12/16.  Aortic root 4.5 cm on 4/17 echo.  - MRA chest 12/17 with 5.1 cm ascending aorta.  - Echo 4/18 with 5.1 cm ascending aorta.  7. Carotid dopplers (7/17) with no significant disease.   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  Lives in [place].   FH: No CAD that he knows of. +HTN.  No FH of aneurysm or bicuspid aortic valve.   ROS: All systems reviewed and  negative except as per HPI.   Current Outpatient Prescriptions  Medication Sig Dispense Refill  . acetaminophen (TYLENOL) 325 MG tablet Take 2 tablets (650 mg total) by mouth every 6 (six) hours as needed for mild pain.    Marland Kitchen aspirin EC 325 MG EC tablet Take 1 tablet (325 mg total) by mouth daily. 30 tablet 0  . calcium citrate-vitamin D (CITRACAL+D) 315-200 MG-UNIT tablet Take 1 tablet by mouth daily.    . carvedilol (COREG) 6.25 MG tablet TAKE ONE TABLET BY MOUTH TWICE  DAILY 60 tablet 11  . Coenzyme Q10 (COQ10) 400 MG CAPS Take 400 mg by mouth 2 (two) times daily.    Marland Kitchen docusate sodium (COLACE) 100 MG capsule Take 100 mg by mouth 2 (two) times daily as needed for mild constipation.    . fluticasone (FLONASE) 50 MCG/ACT nasal spray Place 2 sprays into both nostrils daily as needed for allergies or rhinitis.    . Lactobacillus (ULTIMATE PROBIOTIC FORMULA) CAPS Take 1 capsule by mouth daily.     . Magnesium 400 MG TABS Take 400 mg by mouth 2 (two) times daily.     Marland Kitchen oxymetazoline (AFRIN) 0.05 % nasal spray Place 1 spray into both nostrils at bedtime as needed for congestion.    . rosuvastatin (CRESTOR) 10 MG tablet Take 10 mg by mouth daily.    Marland Kitchen spironolactone (ALDACTONE) 25 MG tablet TAKE 1/2 (ONE-HALF) TABLET BY MOUTH ONCE DAILY 15 tablet 3   No current facility-administered medications for this encounter.    BP 122/72 (BP Location: Left Arm, Patient Position: Sitting, Cuff Size: Normal)   Pulse 69   Wt 208 lb 3.2 oz (94.4 kg)   SpO2 98%   BMI 29.87 kg/m  General: Well appearing. No resp difficulty. HEENT: Normal Neck: Supple. JVP 5-6. Carotids 2+ bilat; no bruits. No thyromegaly or nodule noted. Cor: PMI nondisplaced. RRR, No M/G/R noted. Incision stable.  Lungs: CTAB, normal effort. Abdomen: Soft, non-tender, non-distended, no HSM. No bruits or masses. +BS  Extremities: No cyanosis, clubbing, rash, R and LLE no edema.  Neuro: Alert & orientedx3, cranial nerves grossly intact. moves all 4 extremities w/o difficulty. Affect pleasant   Assessment/Plan: 1. CAD: S/p ACS 8/16 with DES to mLCx and proximal to mid RCA.  S/p CABG x 2 in 03/2017 - Denies chest pain - Continue ASA.  - He has memory trouble when he takes Crestor. Would like information about statin alternatives.  Will refer to lipid clinic to help with this.   2. Chronic systolic CHF: Ischemic cardiomyopathy.  EF 30-35% by echo in the hospital.  Cardiac MRI showed EF up to 39% in 12/16.   Echo 4/17 showed EF improved to 40-45%.  Echo 10/2016 with EF 45%. - NYHA II - Continue Coreg 6.25 mg BID - Continue Spiro 12.5 mg hs.  - Off lisinopril at discharge for hypotension. Will consider adding back next visit. He says that when  - Increase Spiro to full tab at night.   3. Bicuspid aortic valve: moderate AI, moderate AS by echo in 4/17.   - s/p AVR 03/2017.   4. Hyperlipidemia:  - LDL 126 in 10/2016. - Refer to lipid clinic as above.   5. Ascending aortic aneurysm: 4.9 cm 01/2017, unchanged from 06/2015 - Repeat CT in 6 months.   BMET, CBC today. Follow up in 2 months.    Arbutus Leas 03/31/2017

## 2017-04-14 ENCOUNTER — Ambulatory Visit (INDEPENDENT_AMBULATORY_CARE_PROVIDER_SITE_OTHER): Payer: BLUE CROSS/BLUE SHIELD | Admitting: Pharmacist

## 2017-04-14 DIAGNOSIS — E782 Mixed hyperlipidemia: Secondary | ICD-10-CM

## 2017-04-14 LAB — LIPID PANEL
Chol/HDL Ratio: 5.6 ratio — ABNORMAL HIGH (ref 0.0–5.0)
Cholesterol, Total: 214 mg/dL — ABNORMAL HIGH (ref 100–199)
HDL: 38 mg/dL — AB (ref >39)
LDL Calculated: 130 mg/dL — ABNORMAL HIGH (ref 0–99)
TRIGLYCERIDES: 231 mg/dL — AB (ref 0–149)
VLDL Cholesterol Cal: 46 mg/dL — ABNORMAL HIGH (ref 5–40)

## 2017-04-14 NOTE — Patient Instructions (Signed)
We will check your baseline cholesterol today   Once we have this, I will submit paperwork for either Praluent or Repatha and call you once I hear back from your insurance company

## 2017-04-14 NOTE — Progress Notes (Signed)
Patient ID: William Contreras                 DOB: [birth year]                    MRN: [id number]     HPI: William Contreras is a 60 y.o. male patient referred to lipid clinic by Dr Aundra Dubin. PMH is significant for systolic HF, HTN, CAD s/p NSTEMI, PCI, and CABG, unstable angina, OSA, HLD, and AVR. Pt has a history of statin intolerance and presents to lipid clinic for further management.  Pt reports taking Lipitor 80mg  in the past. He experienced joint aches and head pain. Most recently, he has tried Crestor 10mg  daily but experienced muscle and joint pain as well as mental fogginess where he would have trouble remembering things. His wife also noticed this as well and stated that symptoms would improve within a few days to a few weeks after discontinuing. He is adamant about not taking another statin.  Pt has been working hard to lose weight - he [place] currently down to ~205 from 237 lbs back in 2016. He has made drastic dietary improvements as well and has cut back on eating fried food, red meat, and sweets. He has been eating more oatmeal, grilled chicken, and nuts.  Current Medications: none Intolerances: Lipitor 80mg  daily - joint and head pain, Crestor 10mg  daily - memory issues Risk Factors: CAD s/p NSTEMI, PCI, and CABG, unstable angina LDL goal: 70mg /dL  Diet: Eating more oatmeal and chicken. Cut back on red meat and fried food as well as sweets. Likes salmon and nuts.  Exercise: Walking on the treadmill 15 minutes per day.  Family History: Mother with history of MI.  Social History: Former smoker 1 PPD for 15 years, quit 20 years ago. No longer uses alcohol or drugs.  Labs: 10/21/16: TC 234, TG 323, HDL 43, LDL 126 (questionable adherence to Crestor 10mg  daily - taking every other day)  Past Medical History:  Diagnosis Date  . CHF (congestive heart failure) ([place])   . Coronary artery disease   . Essential hypertension   . GERD (gastroesophageal reflux disease)   . Heart murmur   .  Morbid obesity ([place])   . Neuromuscular disorder ([place])    burning in both feet - pt. thinks its related to statin   . Sleep apnea    told that he was borderline, but he has lost 50 lbs & is feeling much better     Current Outpatient Prescriptions on File Prior to Visit  Medication Sig Dispense Refill  . acetaminophen (TYLENOL) 325 MG tablet Take 2 tablets (650 mg total) by mouth every 6 (six) hours as needed for mild pain.    Marland Kitchen aspirin EC 325 MG EC tablet Take 1 tablet (325 mg total) by mouth daily. 30 tablet 0  . calcium citrate-vitamin D (CITRACAL+D) 315-200 MG-UNIT tablet Take 1 tablet by mouth daily.    . carvedilol (COREG) 6.25 MG tablet TAKE ONE TABLET BY MOUTH TWICE DAILY 60 tablet 11  . Coenzyme Q10 (COQ10) 400 MG CAPS Take 400 mg by mouth 2 (two) times daily.    Marland Kitchen docusate sodium (COLACE) 100 MG capsule Take 100 mg by mouth 2 (two) times daily as needed for mild constipation.    . fluticasone (FLONASE) 50 MCG/ACT nasal spray Place 2 sprays into both nostrils daily as needed for allergies or rhinitis.    . Lactobacillus (ULTIMATE PROBIOTIC FORMULA) CAPS Take 1 capsule by mouth  daily.     . Magnesium 400 MG TABS Take 400 mg by mouth 2 (two) times daily.     Marland Kitchen oxymetazoline (AFRIN) 0.05 % nasal spray Place 1 spray into both nostrils at bedtime as needed for congestion.    . rosuvastatin (CRESTOR) 10 MG tablet Take 10 mg by mouth daily.    Marland Kitchen spironolactone (ALDACTONE) 25 MG tablet Take 1 tablet (25 mg total) by mouth daily. 30 tablet 3   No current facility-administered medications on file prior to visit.     Allergies  Allergen Reactions  . Lipitor [Atorvastatin] Other (See Comments)    Joint and head pains  . Penicillins Other (See Comments)    Lightheadness Has patient had a PCN reaction causing immediate rash, facial/tongue/throat swelling, SOB or lightheadedness with hypotension:unknown Has patient had a PCN reaction causing severe rash involving mucus membranes or skin  necrosis: No Has patient had a PCN reaction that required hospitalization: No Has patient had a PCN reaction occurring within the last 10 years: No If all of the above answers are "NO", then may proceed with Cephalosporin use. Childhood allergy rec'd Amoxicillin with     Assessment/Plan:  1. Hyperlipidemia -  Pt is intolerant to multiple statins and will require PCSK9i therapy to bring LDL to goal. Discussed expected benefits, side effects, and injection technique of PCSK9i therapy. Will recheck baseline lipid panel today since most recent lipid panel is 61 months old and insurance will require a more recent lipid panel. Once baseline labs are back, will submit prior authorization for Repatha therapy since this is the preferred PCSK9i on [employer]'s formulary.   Daksh Coates E. Keiara Sneeringer, PharmD, CPP, [facility] [address] [place], [place] [address] Phone: [phone]; Fax: [phone] 04/14/2017 11:22 AM

## 2017-04-17 ENCOUNTER — Telehealth: Payer: Self-pay | Admitting: Pharmacist

## 2017-04-17 ENCOUNTER — Other Ambulatory Visit: Payer: Self-pay | Admitting: Pharmacist

## 2017-04-17 MED ORDER — EVOLOCUMAB 140 MG/ML ~~LOC~~ SOAJ: 1.0000 "pen " | SUBCUTANEOUS | 11 refills | Status: DC

## 2017-04-17 NOTE — Telephone Encounter (Signed)
Prior authorization for Repatha submitted and approved immediately. Rx sent to [facility] - pt qualifies for $0 copay card. Pt is aware and will contact clinic once he has received his first shipment.

## 2017-04-19 ENCOUNTER — Other Ambulatory Visit: Payer: Self-pay | Admitting: Cardiothoracic Surgery

## 2017-04-19 DIAGNOSIS — I25119 Atherosclerotic heart disease of native coronary artery with unspecified angina pectoris: Secondary | ICD-10-CM

## 2017-04-20 ENCOUNTER — Ambulatory Visit (INDEPENDENT_AMBULATORY_CARE_PROVIDER_SITE_OTHER): Payer: Self-pay | Admitting: Cardiothoracic Surgery

## 2017-04-20 ENCOUNTER — Encounter: Payer: Self-pay | Admitting: Cardiothoracic Surgery

## 2017-04-20 ENCOUNTER — Ambulatory Visit
Admission: RE | Admit: 2017-04-20 | Discharge: 2017-04-20 | Disposition: A | Discharge status: Home / Self Care | Payer: BLUE CROSS/BLUE SHIELD | Source: Ambulatory Visit | Attending: Cardiothoracic Surgery | Admitting: Cardiothoracic Surgery

## 2017-04-20 DIAGNOSIS — I5022 Chronic systolic (congestive) heart failure: Secondary | ICD-10-CM

## 2017-04-20 DIAGNOSIS — I25119 Atherosclerotic heart disease of native coronary artery with unspecified angina pectoris: Secondary | ICD-10-CM

## 2017-04-20 MED ORDER — LISINOPRIL 2.5 MG PO TABS: 2.5000 mg | ORAL_TABLET | Freq: Every day | ORAL | 1 refills | Status: DC

## 2017-04-20 MED ORDER — ASPIRIN EC 81 MG PO TBEC: 81.0000 mg | DELAYED_RELEASE_TABLET | Freq: Every day | ORAL | Status: AC

## 2017-04-20 NOTE — Patient Instructions (Signed)
Heart  Valve Replacement-Care After  Read the instructions outlined below and refer to this sheet for the next few weeks. These discharge instructions provide you with general information on caring for yourself after you leave the hospital. Your surgeon may also give you specific instructions. While your treatment has been planned according to the most current medical practices available, unavoidable complications occasionally occur. If you have any problems or questions after discharge, please call your surgeon. AFTER THE PROCEDURE  Full recovery from heart valve surgery can take several months.   Blood thinning (anticoagulation) treatment with warfarin is some times prescribed for 6 weeks to 3 months after surgery for those with biological valves. It is prescribed for life for those with mechanical valves.   Recovery includes healing of the surgical incision. There is a gradual building of stamina and exercise abilities. An exercise program under the direction of a physical therapist may be recommended.   Once you have an artificial valve, your heart function and your life will return to normal. You usually feel better after surgery. Shortness of breath and fatigue should lessen. If your heart was already severely damaged before your surgery, you may continue to have problems.    Individuals with an aortic valve replacement need to take antibiotics before having dental work or other surgical procedures. This is called prophylactic antibiotic treatment. These drugs help to prevent infective endocarditis. Antibiotics are only recommended for individuals with the highest risk for developing infective endocarditis. Let your dentist and your caregiver know if you have a history of any of the following so that the necessary precautions can be taken:  Endocarditis in the past.   An artificial (prosthetic) heart valve.  HOME CARE INSTRUCTIONS   Use all medications as prescribed.   Take your  temperature every morning for the first week after surgery. Record these.   Weigh yourself every morning for at least the first week after surgery and record.   Do not lift more than 15 pounds until your breastbone (sternum) has healed, about 3 months. Avoid all activities which would place strain on your incision.   You may shower as soon as directed by your caregiver after surgery. Pat incisions dry. Do not rub incisions with washcloth or towel.   Avoid driving for 4 weeks following surgery or as instructed.  Pain Control  If a prescription was given for a pain reliever, please follow your doctor's directions.   If the pain is not relieved by your medicine, becomes worse, or you have difficulty breathing, call your surgeon.  Activity  Take frequent rest periods throughout the day.   Wait one week before returning to strenuous activities such as heavy lifting (more than 10 pounds), pushing or pulling.   Talk with your doctor about when you may return to work and your exercise routine.   Do not drive while taking prescription pain medication.  Nutrition  You may resume your normal diet.   Drink plenty of fluids (6-8 glasses a day).   Eat a well-balanced diet.   Call your caregiver for persistent nausea or vomiting.  Elimination Your normal bowel function should return. If constipation should occur, you may:  Take a mild laxative.   Add fruit and bran to your diet.   Drink more fluids.   Call your doctor if constipation is not relieved.  SEEK IMMEDIATE MEDICAL CARE IF:   You develop chest pain which is not coming from your surgical cut (incision).   You develop shortness of  breath or have difficulty breathing.   You develop a temperature over 101 F (38.3 C).   You have a sudden weight gain. Let your caregiver know what the weight gain is.   You develop a rash.   You develop any reaction or side effects to medications given.   You have increased bleeding from  wounds.   You see redness, swelling, or have increasing pain in wounds.   You have pus coming from your wound.   You develop lightheadedness or feel faint.   Endocarditis Information  You may be at risk for developing endocarditis since you have  an artificial heart valve  or a repaired heart valve. Endocarditis is an infection of the lining of the heart or heart valves.   Certain surgical and dental procedures may put you at risk,  such as teeth cleaning or other dental procedures or any surgery involving the respiratory, urinary, gastrointestinal tract, gallbladder or prostate.   Notify your doctor or dentist before having any invasive procedures. You will need to take antibiotics before certain procedures.   To prevent endocarditis, maintain good oral health. Seek prompt medical attention for any mouth/gum, skin or urinary tract infections.

## 2017-04-20 NOTE — Progress Notes (Signed)
[address]       [place] [address]             [phone]      William Contreras [name] Medical Record #[id number] Date of Birth: [birth year]  Referring: William Dresser, MD Primary Care: William Sheriff, MD  Chief Complaint:   POST OP FOLLOW UP 03/14/2017   OPERATIVE REPORT PREOPERATIVE DIAGNOSIS:  Aortic stenosis with probable bicuspid aortic valve, dilated ascending aorta, and coronary artery disease. POSTOPERATIVE DIAGNOSIS:  Aortic stenosis with probable bicuspid aortic valve, dilated ascending aorta, and coronary artery disease. SURGICAL PROCEDURE:  Aortic valve replacement with pericardial tissue valve, [employer], 25 mm, model 3300TFX, serial [id number]; replacement of ascending aorta with a 32-mm Hemashield woven Dacron graft; coronary artery bypass grafting x2 with the left internal mammary to the left anterior descending and reverse saphenous vein graft to the intermediate coronary artery with left thigh endovascular vein harvesting of the left greater saphenous vein.  History of Present Illness:     Patient doing well following his recent aortic valve replacement and replacement of ascending aorta. He notes the chest incision discomfort is gradually improving. He's to start cardiac rehabilitation in the near future He does note that his diastolic blood pressure is been somewhat elevated at home.    Past Medical History:  Diagnosis Date  . CHF (congestive heart failure) ([place])   . Coronary artery disease   . Essential hypertension   . GERD (gastroesophageal reflux disease)   . Heart murmur   . Morbid obesity ([place])   . Neuromuscular disorder ([place])    burning in both feet - pt. thinks its related to statin   . Sleep apnea    told that he was borderline, but he has lost 50 lbs & is feeling much better      History  Smoking Status  . Former Smoker  . Packs/day: 1.00  . Years: 15.00  . Types: Cigarettes  Smokeless  Tobacco  . Never Used    Comment: quit smoking ~ 20 yrs ago.    History  Alcohol Use No    Comment: prev drank - none in ~ 25 yrs.     Allergies  Allergen Reactions  . Lipitor [Atorvastatin] Other (See Comments)    Joint and head pains  . Penicillins Other (See Comments)    Lightheadness Has patient had a PCN reaction causing immediate rash, facial/tongue/throat swelling, SOB or lightheadedness with hypotension:unknown Has patient had a PCN reaction causing severe rash involving mucus membranes or skin necrosis: No Has patient had a PCN reaction that required hospitalization: No Has patient had a PCN reaction occurring within the last 10 years: No If all of the above answers are "NO", then may proceed with Cephalosporin use. Childhood allergy rec'd Amoxicillin with     Current Outpatient Prescriptions  Medication Sig Dispense Refill  . acetaminophen (TYLENOL) 325 MG tablet Take 2 tablets (650 mg total) by mouth every 6 (six) hours as needed for mild pain.    Marland Kitchen aspirin EC 325 MG EC tablet Take 1 tablet (325 mg total) by mouth daily. 30 tablet 0  . calcium citrate-vitamin D (CITRACAL+D) 315-200 MG-UNIT tablet Take 1 tablet by mouth daily.    . carvedilol (COREG) 6.25 MG tablet TAKE ONE TABLET BY MOUTH TWICE DAILY 60 tablet 11  . Coenzyme Q10 (COQ10) 400 MG CAPS Take 400 mg by mouth 2 (two) times daily.    Marland Kitchen  docusate sodium (COLACE) 100 MG capsule Take 100 mg by mouth 2 (two) times daily as needed for mild constipation.    . Evolocumab (REPATHA SURECLICK) 834 MG/ML SOAJ Inject 1 pen into the skin every 14 (fourteen) days. 2 pen 11  . fluticasone (FLONASE) 50 MCG/ACT nasal spray Place 2 sprays into both nostrils daily as needed for allergies or rhinitis.    . Lactobacillus (ULTIMATE PROBIOTIC FORMULA) CAPS Take 1 capsule by mouth daily.     . Magnesium 400 MG TABS Take 400 mg by mouth 2 (two) times daily.     Marland Kitchen oxymetazoline (AFRIN) 0.05 % nasal spray Place 1 spray into both nostrils  at bedtime as needed for congestion.    Marland Kitchen spironolactone (ALDACTONE) 25 MG tablet Take 1 tablet (25 mg total) by mouth daily. (Patient taking differently: Take 12.5 mg by mouth daily. ) 30 tablet 3   No current facility-administered medications for this visit.        Physical Exam: BP (!) 143/103   Pulse 88   Ht 5\' 10"  (1.778 m)   Wt 211 lb (95.7 kg)   SpO2 93%   BMI 30.28 kg/m   General appearance: alert and cooperative Neurologic: intact Heart: regular rate and rhythm, S1, S2 normal, no murmur, click, rub or gallop Lungs: clear to auscultation bilaterally Abdomen: soft, non-tender; bowel sounds normal; no masses,  no organomegaly Extremities: extremities normal, atraumatic, no cyanosis or edema Wound: Sternum is stable and well-healed   Diagnostic Studies & Laboratory data:     Recent Radiology Findings:   Dg Chest 2 View  Result Date: 04/20/2017 CLINICAL DATA:  History CABG.  Soreness. EXAM: CHEST  2 VIEW COMPARISON:  03/18/2017 FINDINGS: There is no focal parenchymal opacity. There is no pleural effusion or pneumothorax. The heart and mediastinal contours are unremarkable. There is evidence of prior CABG. The osseous structures are unremarkable. IMPRESSION: No active cardiopulmonary disease. Electronically Signed   By: Kathreen Devoid   On: 04/20/2017 09:40      Recent Lab Findings: Lab Results  Component Value Date   WBC 7.5 03/31/2017   HGB 11.8 (L) 03/31/2017   HCT 35.0 (L) 03/31/2017   PLT 452 (H) 03/31/2017   GLUCOSE 97 03/31/2017   CHOL 214 (H) 04/14/2017   TRIG 231 (H) 04/14/2017   HDL 38 (L) 04/14/2017   LDLCALC 130 (H) 04/14/2017   ALT 20 03/09/2017   AST 21 03/09/2017   NA 135 03/31/2017   K 4.4 03/31/2017   CL 101 03/31/2017   CREATININE 0.93 03/31/2017   BUN 14 03/31/2017   CO2 25 03/31/2017   TSH 0.402 03/15/2017   INR 1.38 03/14/2017   HGBA1C 5.1 03/09/2017      Assessment / Plan:     Patient doing well following recent replacement of  his aortic valve and ascending aorta.-  His blood pressure is elevated slightly in the office today, he continues on beta blocker we will resume low-dose lisinopril which she was on preop.  Since his work involves significant lifting he will stay off work until December. Plan to see him back in 6 months with a follow-up baseline CTA of the chest after replacement of his ascending aorta.   With the patient's prosthetic heart valve the risks of endocarditis have been discussed. The recommendations for periprocedural antibiotics including dental procedures have been discussed with the patient.     Grace Isaac MD      [address] [place] [address]  Office [phone]   Beeper [phone]  04/20/2017 10:24 AM

## 2017-05-11 ENCOUNTER — Encounter (HOSPITAL_COMMUNITY): Payer: Self-pay

## 2017-05-11 ENCOUNTER — Encounter (HOSPITAL_COMMUNITY): Payer: Self-pay | Admitting: Cardiology

## 2017-05-11 ENCOUNTER — Ambulatory Visit (HOSPITAL_COMMUNITY)
Admission: RE | Admit: 2017-05-11 | Discharge: 2017-05-11 | Disposition: A | Discharge status: Home / Self Care | Payer: BLUE CROSS/BLUE SHIELD | Source: Ambulatory Visit | Attending: Cardiology | Admitting: Cardiology

## 2017-05-11 VITALS — BP 142/90 | HR 68 | Wt 213.8 lb

## 2017-05-11 DIAGNOSIS — I110 Hypertensive heart disease with heart failure: Secondary | ICD-10-CM | POA: Insufficient documentation

## 2017-05-11 DIAGNOSIS — I252 Old myocardial infarction: Secondary | ICD-10-CM | POA: Insufficient documentation

## 2017-05-11 DIAGNOSIS — I2510 Atherosclerotic heart disease of native coronary artery without angina pectoris: Secondary | ICD-10-CM | POA: Diagnosis not present

## 2017-05-11 DIAGNOSIS — Z953 Presence of xenogenic heart valve: Secondary | ICD-10-CM | POA: Insufficient documentation

## 2017-05-11 DIAGNOSIS — Z8249 Family history of ischemic heart disease and other diseases of the circulatory system: Secondary | ICD-10-CM | POA: Insufficient documentation

## 2017-05-11 DIAGNOSIS — I255 Ischemic cardiomyopathy: Secondary | ICD-10-CM | POA: Diagnosis not present

## 2017-05-11 DIAGNOSIS — I712 Thoracic aortic aneurysm, without rupture: Secondary | ICD-10-CM

## 2017-05-11 DIAGNOSIS — Q231 Congenital insufficiency of aortic valve: Secondary | ICD-10-CM

## 2017-05-11 DIAGNOSIS — Q2381 Bicuspid aortic valve: Secondary | ICD-10-CM

## 2017-05-11 DIAGNOSIS — Z87891 Personal history of nicotine dependence: Secondary | ICD-10-CM | POA: Insufficient documentation

## 2017-05-11 DIAGNOSIS — Z9889 Other specified postprocedural states: Secondary | ICD-10-CM | POA: Insufficient documentation

## 2017-05-11 DIAGNOSIS — I480 Paroxysmal atrial fibrillation: Secondary | ICD-10-CM

## 2017-05-11 DIAGNOSIS — I25110 Atherosclerotic heart disease of native coronary artery with unstable angina pectoris: Secondary | ICD-10-CM

## 2017-05-11 DIAGNOSIS — E785 Hyperlipidemia, unspecified: Secondary | ICD-10-CM | POA: Diagnosis not present

## 2017-05-11 DIAGNOSIS — I5022 Chronic systolic (congestive) heart failure: Secondary | ICD-10-CM | POA: Diagnosis present

## 2017-05-11 DIAGNOSIS — M791 Myalgia, unspecified site: Secondary | ICD-10-CM | POA: Insufficient documentation

## 2017-05-11 DIAGNOSIS — Z951 Presence of aortocoronary bypass graft: Secondary | ICD-10-CM | POA: Insufficient documentation

## 2017-05-11 DIAGNOSIS — Z7982 Long term (current) use of aspirin: Secondary | ICD-10-CM | POA: Diagnosis not present

## 2017-05-11 DIAGNOSIS — Z79899 Other long term (current) drug therapy: Secondary | ICD-10-CM | POA: Insufficient documentation

## 2017-05-11 DIAGNOSIS — I7121 Aneurysm of the ascending aorta, without rupture: Secondary | ICD-10-CM

## 2017-05-11 LAB — BASIC METABOLIC PANEL
Anion gap: 7 (ref 5–15)
BUN: 12 mg/dL (ref 6–20)
CO2: 24 mmol/L (ref 22–32)
Calcium: 9.5 mg/dL (ref 8.9–10.3)
Chloride: 104 mmol/L (ref 101–111)
Creatinine, Ser: 0.9 mg/dL (ref 0.61–1.24)
GFR calc Af Amer: >60 mL/min (ref >60)
Glucose, Bld: 93 mg/dL (ref 65–99)
POTASSIUM: 4.1 mmol/L (ref 3.5–5.1)
SODIUM: 135 mmol/L (ref 135–145)

## 2017-05-11 LAB — CBC
HCT: 41.3 % (ref 39.0–52.0)
Hemoglobin: 14.2 g/dL (ref 13.0–17.0)
MCH: 30.9 pg (ref 26.0–34.0)
MCHC: 34.4 g/dL (ref 30.0–36.0)
MCV: 89.8 fL (ref 78.0–100.0)
PLATELETS: 273 10*3/uL (ref 150–400)
RBC: 4.6 MIL/uL (ref 4.22–5.81)
RDW: 12.5 % (ref 11.5–15.5)
WBC: 7.5 10*3/uL (ref 4.0–10.5)

## 2017-05-11 MED ORDER — LISINOPRIL 2.5 MG PO TABS: 2.5000 mg | ORAL_TABLET | Freq: Two times a day (BID) | ORAL | 6 refills | Status: DC

## 2017-05-11 NOTE — Patient Instructions (Signed)
Will have someone reach out to you for a Repatha patient contact.  INCREASE Lisinopril to 2.5 mg tablet twice daily.  Routine lab work today. Will notify you of abnormal results, otherwise no news is good news!  Will schedule you for an echocardiogram, lab work, and 30 day event cardiac monitor at [facility]. Address: [address] ([place]), [place], [place] [address]  Phone: [phone]  May return to cardiac rehab as tolerated.  Follow up end of the month with Dr. Aundra Dubin as scheduled. See attached sheet for additional details.  Take all medication as prescribed the day of your appointment. Bring all medications with you to your appointment.  Do the following things EVERYDAY: 1) Weigh yourself in the morning before breakfast. Write it down and keep it in a log. 2) Take your medicines as prescribed 3) Eat low salt foods-Limit salt (sodium) to 2000 mg per day.  4) Stay as active as you can everyday 5) Limit all fluids for the day to less than 2 liters

## 2017-05-12 ENCOUNTER — Other Ambulatory Visit (HOSPITAL_COMMUNITY): Payer: Self-pay | Admitting: Cardiology

## 2017-05-13 NOTE — Progress Notes (Signed)
Patient ID: William Contreras, male   DOB: [birth year], 61 y.o.   MRN: [id number] PCP: Dr. Lin Landsman Cardiology: Dr. Aundra Dubin  61 yo with history of HTN was admitted in 8/16 with acute systolic CHF => volume overloaded, short of breath.  Troponin was mildly elevated to 0.04.  Echo showed EF 30-35%.  He was diuresed and had cardiac cath.  This showed diffuse severe disease but the LAD disease was relatively mild to CABG not recommended.  He had PCI with DES to mLCx and proximal to mid RCA.  Cardiac MRI/MRA chest showed EF 39%, bicuspid aortic valve with probable moderate aortic stenosis, 4.9 cm ascending aorta.  MRA in 12/17 showed ascending aorta dimension up to 5.1 cm.  He saw Dr. Servando Snare for aorta evaluation. Echo in 4/18 showed EF 45% with inferolateral hypokinesis, moderate AS with mean gradient 38 mmHg and AVA 1.3 cm^2, mild to moderate aortic insufficiency, bicuspid aortic valve, ascending aorta 5.1 cm.  I talked with Dr. Servando Snare and decided that it was time for AVR + ascending aorta replacement.  LHC in 8/18 showed progressive disease as described below in HPI.   Patient had bioprosthetic AVR, ascending aorta replacement, and CABG with LIMA-LAD and SVG-ramus in 9/18. He did not have any significant peri-operative complications.    He is doing well symptomatically post-op.  His chest is still sore at the surgical site but no exertional worsening.  No dyspnea with normal activities.  He has been going to cardiac rehab. Feels better than prior to surgery. Of note, atrial fibrillation was apparently noted at last rehab session (no telemetry available to review).  Today, he is in NSR.  He did not feel palpitations, lightheadedness, etc.    He does not tolerate statins. He qualifies for Repatha but has not wanted to take it.      ECG (personally reviewed): NSR, IVCD  Labs (8/16): K 4.1, creatinine 1.0, HCT 42.6, LDL 200 Labs (9/16): K 4, creatinine 1.04, BNP 383 Labs (11/16): K 4.1, creatinine 0.99 Labs  (12/16): LDL 152, HDL 37 (not taking Crestor) Labs (3/17): LDL 92, HDL 36, K 4.1, creatinine 0.92 Labs (6/17): LDL 83, HDL 38, K 3.8, creatinine 1.04 Labs (11/17): creatinine 0.94 Labs (9/18): K 4.4, creatinine 0.93 Labs (10/18): LDL 130  PMH: 1. HTN 2. CAD: NSTEMI/CHF in 8/16.  LHC (8/16) showed 40-50% mLAD, 50% prox ramus, 80% mLCx, total occlusion moderate OM2, 50% pRCA, 80% mRCA, 50% dRCA.  He had DES to prox and mid RCA and mid LCx.   - LHC (8/18): 50-60% mLAD, 40-50% dLM, severe diffuse disease in LCx involving moderate ramus and 3 relatively small OMs.   - 9/18 CABG with LIMA-LAD and SVG-ramus.  3. Chronic systolic CHF: Ischemic cardiomyopathy.   - Echo (8/16) with EF 30-35%, diffuse hypokinesis, mild LVH, ?bicuspid aortic valve with mild AS and mild AI, 5.1 cm aortic root.  - Cardiac MRI (12/16): EF 39%, mild LV dilation and moderate LVH, inferior/inferolateral/anterolateral hypokinesis, LGE in the inferior and inferolateral walls, normal RV size and systolic function, mild aortic insufficiency, bicuspid aortic valve with probable moderate aortic stenosis.  - Echo (4/17): EF 40-45%, inferolateral hypokinesis, bicuspid aortic valve with moderate aortic stenosis (mean gradient 33 mmHg), moderate AI, 4.5 cm aortic root. - Echo (4/18): EF 45% with inferolateral hypokinesis, moderate AS with mean gradient 38 mmHg and AVA 1.3 cm^2, mild to moderate aortic insufficiency, bicuspid aortic valve, ascending aorta 5.1 cm.  4. Hyperlipidemia: Myalgias with all statins.  5. Bicuspid aortic  valve: Moderate AS by 12/16 MRI, mild AS by 8/16 echo.  Moderate AS/moderate AI by 4/17 echo.  - Echo (4/18) with moderate AS and mild to moderate AI.  Mean AoV gradient 38 mmHg with AVA 1.3 cm^2.  - Bioprosthetic aortic valve in 9/18.  6. Ascending aortic aneurysm: 4.9 cm ascending aorta by MRA chest in 12/16.  Aortic root 4.5 cm on 4/17 echo.  - MRA chest 12/17 with 5.1 cm ascending aorta.  - Echo 4/18 with 5.1  cm ascending aorta.  - Ascending aorta replaced in 9/18.  7. Carotid dopplers (7/17) with no significant disease.   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  Lives in [place].   FH: No CAD that he knows of. +HTN.  No FH of aneurysm or bicuspid aortic valve.   ROS: All systems reviewed and negative except as per HPI.   Current Outpatient Prescriptions  Medication Sig Dispense Refill  . acetaminophen (TYLENOL) 325 MG tablet Take 2 tablets (650 mg total) by mouth every 6 (six) hours as needed for mild pain.    Marland Kitchen aspirin EC 81 MG tablet Take 1 tablet (81 mg total) by mouth daily.    . calcium citrate-vitamin D (CITRACAL+D) 315-200 MG-UNIT tablet Take 1 tablet by mouth daily.    . carvedilol (COREG) 6.25 MG tablet TAKE ONE TABLET BY MOUTH TWICE DAILY 60 tablet 11  . Coenzyme Q10 (COQ10) 400 MG CAPS Take 400 mg by mouth 2 (two) times daily.    Marland Kitchen docusate sodium (COLACE) 100 MG capsule Take 100 mg by mouth 2 (two) times daily as needed for mild constipation.    . fluticasone (FLONASE) 50 MCG/ACT nasal spray Place 2 sprays into both nostrils daily as needed for allergies or rhinitis.    . Lactobacillus (ULTIMATE PROBIOTIC FORMULA) CAPS Take 1 capsule by mouth daily.     Marland Kitchen lisinopril (PRINIVIL,ZESTRIL) 2.5 MG tablet Take 1 tablet (2.5 mg total) by mouth 2 (two) times daily. 60 tablet 6  . Magnesium 400 MG TABS Take 400 mg by mouth 2 (two) times daily.     Marland Kitchen oxymetazoline (AFRIN) 0.05 % nasal spray Place 1 spray into both nostrils at bedtime as needed for congestion.    Marland Kitchen spironolactone (ALDACTONE) 25 MG tablet Take 12.5 mg by mouth daily.     No current facility-administered medications for this encounter.    BP (!) 142/90 (BP Location: Right Arm, Patient Position: Sitting, Cuff Size: Normal)   Pulse 68   Wt 213 lb 12.8 oz (97 kg)   SpO2 97%   BMI 30.68 kg/m  General: NAD Neck: No JVD, no thyromegaly or thyroid nodule.  Lungs: Clear to auscultation  bilaterally with normal respiratory effort. CV: Nondisplaced PMI.  Heart regular S1/S2, no S3/S4, 1/6 SEM RUSB.  No peripheral edema.  No carotid bruit.  Normal pedal pulses.  Abdomen: Soft, nontender, no hepatosplenomegaly, no distention.  Skin: Intact without lesions or rashes.  Neurologic: Alert and oriented x 3.  Psych: Normal affect. Extremities: No clubbing or cyanosis.  HEENT: Normal.   Assessment/Plan: 1. CAD: S/p ACS 8/16 with DES to mLCx and proximal to mid RCA.  CABG with LIMA-LAD and SVG-ramus in 9/18. No ischemic symptoms. - Continue ASA 81.  - Cannot tolerate statins.  - I strongly recommended starting Repatha but he is concerned about things he read on the internet.  He wants to talk to someone who takes [place].  I will see if the  lipid clinic can coordinate this.  2. Chronic systolic CHF: Ischemic cardiomyopathy.  EF 30-35% by echo in 2016.  Cardiac MRI showed EF up to 39% in 12/16.  Echo 4/17 showed EF improved to 40-45%.  Echo in 4/18 with EF 45%.  NYHA class I-II symptoms now, looks euvolemic.  - Needs post-op echo, will arrange.  - Continue Coreg.  - BP mildly elevated, he agrees to increase lisinopril to 2.5 mg bid. BMET today and in 2 wks.  - Continue current spironolactone.  3. Bicuspid aortic valve: moderate AI, moderate AS by echo in 4/17.  Bioprosthetic AVR in 9/18.  Will arrange echo for baseline bioprosthetic valve assessment.  4. Hyperlipidemia: As above, encouraged him again to start [place].  5. Ascending aortic aneurysm: 4.9 cm on MRA chest in 12/16.  Likely related to bicuspid aortic valve.  4.5 cm aortic root on 4/17 echo.  MRA in 12/17 and echo in 4/18 showed increase in ascending aorta dimension to 5.1 cm.  In 9/18, had ascending aorta repair. Will followup with Dr. Servando Snare soon. 6. Atrial fibrillation: Paroxysmal.  Noted at cardiac rehab. He is in NSR today.  He did not feel the afib. I will place a 30 day monitor. If there is a significant atrial  fibrillation burden, he will need to be anticoagulated.   It will be ok for him to return to cardiac rehab.   Followup in 5 wks after monitor.   Loralie Champagne 05/13/2017

## 2017-05-15 ENCOUNTER — Telehealth: Payer: Self-pay | Admitting: Pharmacist

## 2017-05-15 NOTE — Telephone Encounter (Signed)
Pt returned call to clinic and states he refuses to take Repatha injections. When asked if he had any particular concerns or if he wanted to come in to talk about the medicine again, he stated that he did not and that he "just won't take [place]." When asked for any particular reason why, he states that "he has a lot of concerns" and that it would have to be a last line option. Discussed with pt that he had a heart attack in his 37s and is intolerant to statins so this is his last line option and that his cardiac risk is high, and he still refuses therapy. He states nothing will change his mind. Will forward to Dr Aundra Dubin as an Juluis Rainier.

## 2017-05-15 NOTE — Telephone Encounter (Signed)
LMOM for pt x2 - pt was seen in HF clinic recently by Dr Aundra Dubin and was hesitant to start [place] therapy. He has already been seen in lipid clinic and was approved for Repatha with a $0 copay.

## 2017-05-16 ENCOUNTER — Other Ambulatory Visit: Payer: Self-pay

## 2017-05-16 ENCOUNTER — Ambulatory Visit (HOSPITAL_COMMUNITY): Payer: BLUE CROSS/BLUE SHIELD | Attending: Cardiovascular Disease

## 2017-05-16 ENCOUNTER — Ambulatory Visit (INDEPENDENT_AMBULATORY_CARE_PROVIDER_SITE_OTHER): Payer: BLUE CROSS/BLUE SHIELD

## 2017-05-16 ENCOUNTER — Other Ambulatory Visit (HOSPITAL_COMMUNITY): Payer: BLUE CROSS/BLUE SHIELD

## 2017-05-16 DIAGNOSIS — I081 Rheumatic disorders of both mitral and tricuspid valves: Secondary | ICD-10-CM | POA: Insufficient documentation

## 2017-05-16 DIAGNOSIS — I4891 Unspecified atrial fibrillation: Secondary | ICD-10-CM | POA: Diagnosis not present

## 2017-05-16 DIAGNOSIS — Z953 Presence of xenogenic heart valve: Secondary | ICD-10-CM | POA: Diagnosis not present

## 2017-05-16 DIAGNOSIS — I2510 Atherosclerotic heart disease of native coronary artery without angina pectoris: Secondary | ICD-10-CM | POA: Insufficient documentation

## 2017-05-16 DIAGNOSIS — I480 Paroxysmal atrial fibrillation: Secondary | ICD-10-CM | POA: Diagnosis not present

## 2017-05-16 DIAGNOSIS — I5022 Chronic systolic (congestive) heart failure: Secondary | ICD-10-CM | POA: Insufficient documentation

## 2017-05-22 ENCOUNTER — Encounter (HOSPITAL_COMMUNITY): Payer: Self-pay | Admitting: *Deleted

## 2017-05-23 ENCOUNTER — Telehealth (HOSPITAL_COMMUNITY): Payer: Self-pay | Admitting: *Deleted

## 2017-05-23 MED ORDER — LISINOPRIL 2.5 MG PO TABS: ORAL_TABLET | ORAL | 6 refills | Status: DC

## 2017-05-23 NOTE — Telephone Encounter (Signed)
Spoke w/pt, he is aware.  He is hesitant to increase that much as he had trouble w/dizziness in the past.  He is agreeable to increase his PM dose to 5 mg and cont 2.5 in AM to see how this does.  He is sch for f/u on 11/26, will check labs then.

## 2017-05-23 NOTE — Telephone Encounter (Signed)
Increase lisinopril to 5 mg bid with BMET 10 days.

## 2017-05-23 NOTE — Telephone Encounter (Signed)
Called pt w/echo results, he is aware.  He also mentioned his BP, the diastolic has been running high.  He states it is always 90 or higher in the AM, it does come down to 80s in the evening.  He states he did increase Lisinopril to 2.5 mg BID as instructed at last appt 97/8 but diastolic has continued to run 90s.  His systolic is running [id number].  Will send to Dr Aundra Dubin for further review.

## 2017-05-31 ENCOUNTER — Telehealth (HOSPITAL_COMMUNITY): Payer: Self-pay | Admitting: Vascular Surgery

## 2017-05-31 NOTE — Telephone Encounter (Signed)
Pt needs refill Lisinopril sent to [facility], pt is out due to increased dosage please advise

## 2017-06-05 ENCOUNTER — Encounter (HOSPITAL_COMMUNITY): Payer: Self-pay

## 2017-06-05 ENCOUNTER — Ambulatory Visit (HOSPITAL_COMMUNITY)
Admission: RE | Admit: 2017-06-05 | Discharge: 2017-06-05 | Disposition: A | Discharge status: Home / Self Care | Payer: BLUE CROSS/BLUE SHIELD | Source: Ambulatory Visit | Attending: Cardiology | Admitting: Cardiology

## 2017-06-05 ENCOUNTER — Encounter (HOSPITAL_COMMUNITY): Payer: Self-pay | Admitting: Cardiology

## 2017-06-05 VITALS — BP 136/88 | HR 68 | Wt 219.4 lb

## 2017-06-05 DIAGNOSIS — Z953 Presence of xenogenic heart valve: Secondary | ICD-10-CM | POA: Diagnosis not present

## 2017-06-05 DIAGNOSIS — I110 Hypertensive heart disease with heart failure: Secondary | ICD-10-CM | POA: Diagnosis not present

## 2017-06-05 DIAGNOSIS — I712 Thoracic aortic aneurysm, without rupture: Secondary | ICD-10-CM | POA: Diagnosis not present

## 2017-06-05 DIAGNOSIS — I25110 Atherosclerotic heart disease of native coronary artery with unstable angina pectoris: Secondary | ICD-10-CM | POA: Diagnosis not present

## 2017-06-05 DIAGNOSIS — I2510 Atherosclerotic heart disease of native coronary artery without angina pectoris: Secondary | ICD-10-CM | POA: Insufficient documentation

## 2017-06-05 DIAGNOSIS — I252 Old myocardial infarction: Secondary | ICD-10-CM | POA: Insufficient documentation

## 2017-06-05 DIAGNOSIS — I5022 Chronic systolic (congestive) heart failure: Secondary | ICD-10-CM | POA: Diagnosis present

## 2017-06-05 DIAGNOSIS — E785 Hyperlipidemia, unspecified: Secondary | ICD-10-CM | POA: Insufficient documentation

## 2017-06-05 DIAGNOSIS — I480 Paroxysmal atrial fibrillation: Secondary | ICD-10-CM | POA: Diagnosis not present

## 2017-06-05 DIAGNOSIS — Z79899 Other long term (current) drug therapy: Secondary | ICD-10-CM | POA: Insufficient documentation

## 2017-06-05 DIAGNOSIS — Z951 Presence of aortocoronary bypass graft: Secondary | ICD-10-CM | POA: Diagnosis not present

## 2017-06-05 DIAGNOSIS — Z7982 Long term (current) use of aspirin: Secondary | ICD-10-CM | POA: Insufficient documentation

## 2017-06-05 DIAGNOSIS — Q231 Congenital insufficiency of aortic valve: Secondary | ICD-10-CM | POA: Diagnosis not present

## 2017-06-05 DIAGNOSIS — I255 Ischemic cardiomyopathy: Secondary | ICD-10-CM | POA: Diagnosis not present

## 2017-06-05 LAB — BASIC METABOLIC PANEL
Anion gap: 7 (ref 5–15)
BUN: 15 mg/dL (ref 6–20)
CHLORIDE: 104 mmol/L (ref 101–111)
CO2: 25 mmol/L (ref 22–32)
CREATININE: 0.9 mg/dL (ref 0.61–1.24)
Calcium: 9.2 mg/dL (ref 8.9–10.3)
GFR calc Af Amer: >60 mL/min (ref >60)
Glucose, Bld: 91 mg/dL (ref 65–99)
Potassium: 3.9 mmol/L (ref 3.5–5.1)
SODIUM: 136 mmol/L (ref 135–145)

## 2017-06-05 MED ORDER — EZETIMIBE 10 MG PO TABS: 10.0000 mg | ORAL_TABLET | Freq: Every day | ORAL | 3 refills | Status: DC

## 2017-06-05 NOTE — Patient Instructions (Signed)
Start Zetia 10 mg (1 tab) daily  Labs drawn today (if we do not call you, then your lab work was stable)   Your physician recommends that you return for lab work in: 3 months   Note to [facility] given  Your physician recommends that you schedule a follow-up appointment in: 3 months

## 2017-06-06 NOTE — Progress Notes (Signed)
Patient ID: Navy Belay, male   DOB: Nov 09, 1955, 61 y.o.   MRN: [id number] PCP: Dr. Lin Landsman Cardiology: Dr. Aundra Dubin  61 yo with history of HTN was admitted in 8/16 with acute systolic CHF => volume overloaded, short of breath.  Troponin was mildly elevated to 0.04.  Echo showed EF 30-35%.  He was diuresed and had cardiac cath.  This showed diffuse severe disease but the LAD disease was relatively mild to CABG not recommended.  He had PCI with DES to mLCx and proximal to mid RCA.  Cardiac MRI/MRA chest showed EF 39%, bicuspid aortic valve with probable moderate aortic stenosis, 4.9 cm ascending aorta.  MRA in 12/17 showed ascending aorta dimension up to 5.1 cm.  He saw Dr. Servando Snare for aorta evaluation. Echo in 4/18 showed EF 45% with inferolateral hypokinesis, moderate AS with mean gradient 38 mmHg and AVA 1.3 cm^2, mild to moderate aortic insufficiency, bicuspid aortic valve, ascending aorta 5.1 cm.  I talked with Dr. Servando Snare and decided that it was time for AVR + ascending aorta replacement.  LHC in 8/18 showed progressive disease as described below in HPI.   Patient had bioprosthetic AVR, ascending aorta replacement, and CABG with LIMA-LAD and SVG-ramus in 9/18. He did not have any significant peri-operative complications.  He was noted to have a run of atrial fibrillation at cardiac rehab.   He returns for followup of CAD and valvular heart disease.  He has been doing well symptomatically.  He does not feel palpitations. He is in NSR today.   He is wearing a 30 day monitor.  No chest pain.  Left side of chest has some numbness post-op.  No exertional dyspnea, feels like he is able to do all his usual activities and feels better doing them than prior to surgery. No orthopnea/PND.   He does not tolerate statins. He qualifies for Repatha but has not wanted to take it.      Labs (8/16): K 4.1, creatinine 1.0, HCT 42.6, LDL 200 Labs (9/16): K 4, creatinine 1.04, BNP 383 Labs (11/16): K 4.1, creatinine  0.99 Labs (12/16): LDL 152, HDL 37 (not taking Crestor) Labs (3/17): LDL 92, HDL 36, K 4.1, creatinine 0.92 Labs (6/17): LDL 83, HDL 38, K 3.8, creatinine 1.04 Labs (11/17): creatinine 0.94 Labs (9/18): K 4.4, creatinine 0.93 Labs (10/18): LDL 130 Labs (11/18): K 4.1, creatinine 0.9  PMH: 1. HTN 2. CAD: NSTEMI/CHF in 8/16.  LHC (8/16) showed 40-50% mLAD, 50% prox ramus, 80% mLCx, total occlusion moderate OM2, 50% pRCA, 80% mRCA, 50% dRCA.  He had DES to prox and mid RCA and mid LCx.   - LHC (8/18): 50-60% mLAD, 40-50% dLM, severe diffuse disease in LCx involving moderate ramus and 3 relatively small OMs.   - 9/18 CABG with LIMA-LAD and SVG-ramus.  3. Chronic systolic CHF: Ischemic cardiomyopathy.   - Echo (8/16) with EF 30-35%, diffuse hypokinesis, mild LVH, ?bicuspid aortic valve with mild AS and mild AI, 5.1 cm aortic root.  - Cardiac MRI (12/16): EF 39%, mild LV dilation and moderate LVH, inferior/inferolateral/anterolateral hypokinesis, LGE in the inferior and inferolateral walls, normal RV size and systolic function, mild aortic insufficiency, bicuspid aortic valve with probable moderate aortic stenosis.  - Echo (4/17): EF 40-45%, inferolateral hypokinesis, bicuspid aortic valve with moderate aortic stenosis (mean gradient 33 mmHg), moderate AI, 4.5 cm aortic root. - Echo (4/18): EF 45% with inferolateral hypokinesis, moderate AS with mean gradient 38 mmHg and AVA 1.3 cm^2, mild to moderate aortic insufficiency,  bicuspid aortic valve, ascending aorta 5.1 cm.  - Echo (11/18): EF 45-50%, mild LV dilation, bioprosthetic aortic valve with mean gradient 11 mmHg, mild mitral regurgitation.   4. Hyperlipidemia: Myalgias with all statins.  5. Bicuspid aortic valve: Moderate AS by 12/16 MRI, mild AS by 8/16 echo.  Moderate AS/moderate AI by 4/17 echo.  - Echo (4/18) with moderate AS and mild to moderate AI.  Mean AoV gradient 38 mmHg with AVA 1.3 cm^2.  - Bioprosthetic aortic valve in 9/18.  6.  Ascending aortic aneurysm: 4.9 cm ascending aorta by MRA chest in 12/16.  Aortic root 4.5 cm on 4/17 echo.  - MRA chest 12/17 with 5.1 cm ascending aorta.  - Echo 4/18 with 5.1 cm ascending aorta.  - Ascending aorta replaced in 9/18.  7. Carotid dopplers (7/17) with no significant disease.   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  Lives in [place].   FH: No CAD that he knows of. +HTN.  No FH of aneurysm or bicuspid aortic valve.   ROS: All systems reviewed and negative except as per HPI.   Current Outpatient Medications  Medication Sig Dispense Refill  . acetaminophen (TYLENOL) 325 MG tablet Take 2 tablets (650 mg total) by mouth every 6 (six) hours as needed for mild pain.    Marland Kitchen aspirin EC 81 MG tablet Take 1 tablet (81 mg total) by mouth daily.    . calcium citrate-vitamin D (CITRACAL+D) 315-200 MG-UNIT tablet Take 1 tablet by mouth daily.    . carvedilol (COREG) 6.25 MG tablet TAKE ONE TABLET BY MOUTH TWICE DAILY 60 tablet 11  . Coenzyme Q10 (COQ10) 400 MG CAPS Take 400 mg by mouth 2 (two) times daily.    Marland Kitchen docusate sodium (COLACE) 100 MG capsule Take 100 mg by mouth 2 (two) times daily as needed for mild constipation.    . fluticasone (FLONASE) 50 MCG/ACT nasal spray Place 2 sprays into both nostrils daily as needed for allergies or rhinitis.    . Lactobacillus (ULTIMATE PROBIOTIC FORMULA) CAPS Take 1 capsule by mouth daily.     Marland Kitchen lisinopril (PRINIVIL,ZESTRIL) 2.5 MG tablet Take 1 tab in AM and 2 tabs in PM (Patient taking differently: Take 2 tabs in AM and 1 tab in PM) 60 tablet 6  . Magnesium 400 MG TABS Take 400 mg by mouth 2 (two) times daily.     Marland Kitchen oxymetazoline (AFRIN) 0.05 % nasal spray Place 1 spray into both nostrils at bedtime as needed for congestion.    Marland Kitchen spironolactone (ALDACTONE) 25 MG tablet Take 12.5 mg by mouth daily.    Marland Kitchen ezetimibe (ZETIA) 10 MG tablet Take 1 tablet (10 mg total) by mouth daily. 30 tablet 3   No current  facility-administered medications for this encounter.    BP 136/88   Pulse 68   Wt 219 lb 6.4 oz (99.5 kg)   SpO2 97%   BMI 31.48 kg/m  General: NAD Neck: No JVD, no thyromegaly or thyroid nodule.  Lungs: Clear to auscultation bilaterally with normal respiratory effort. CV: Nondisplaced PMI.  Heart regular S1/S2, no S3/S4, 1/6 SEM RUSB.  Trace ankle edema.  No carotid bruit.  Normal pedal pulses.  Abdomen: Soft, nontender, no hepatosplenomegaly, no distention.  Skin: Intact without lesions or rashes.  Neurologic: Alert and oriented x 3.  Psych: Normal affect. Extremities: No clubbing or cyanosis.  HEENT: Normal.   Assessment/Plan: 1. CAD: S/p ACS 8/16 with DES to mLCx and  proximal to mid RCA.  CABG with LIMA-LAD and SVG-ramus in 9/18. No ischemic symptoms. - Continue ASA 81.  - Cannot tolerate statins.  - He feels strongly about not taking Repatha or Praluent, I am not sure why.  - I will start him on Zetia 10 mg daily with lipids in 2 months.   2. Chronic systolic CHF: Ischemic cardiomyopathy.  EF 30-35% by echo in 2016.  Cardiac MRI showed EF up to 39% in 12/16.  Echo 4/17 showed EF improved to 40-45%.  Echo in 4/18 with EF 45%.  Post-op echo in 11/18 with EF 45-50%.  NYHA class I-II symptoms now, looks euvolemic.  - Continue current Coreg.  - BP mildly elevated at times.  Increase lisinopril to 5 mg bid. BMET in 10 days.  - Continue current spironolactone.  3. Bicuspid aortic valve: moderate AI, moderate AS by echo in 4/17.  Bioprosthetic AVR in 9/18.  Valve looked ok on post-op echo in 11/18. 4. Hyperlipidemia: As above, starting Zetia.  5. Ascending aortic aneurysm: 4.9 cm on MRA chest in 12/16.  Likely related to bicuspid aortic valve.  4.5 cm aortic root on 4/17 echo.  MRA in 12/17 and echo in 4/18 showed increase in ascending aorta dimension to 5.1 cm.  In 9/18, had ascending aorta repair. Will followup with Dr. Servando Snare. 6. Atrial fibrillation: Paroxysmal.  Noted once briefly  at cardiac rehab. He is in NSR today.  He did not feel the afib. He now has a 30 day monitor. If there is a significant atrial fibrillation burden, he will need to be anticoagulated.   Continue cardiac rehab.   Followup in 3 months.    Loralie Champagne 06/06/2017

## 2017-06-07 ENCOUNTER — Telehealth (HOSPITAL_COMMUNITY): Payer: Self-pay | Admitting: Pharmacist

## 2017-06-07 NOTE — Telephone Encounter (Addendum)
Zetia PA approved by [employer] through 06/06/18.   Ruta Hinds. Velva Harman, PharmD, BCPS, CPP Clinical Pharmacist Pager: [phone] Phone: [phone] 06/07/2017 10:47 AM

## 2017-06-08 MED ORDER — LISINOPRIL 5 MG PO TABS: 5.0000 mg | ORAL_TABLET | Freq: Two times a day (BID) | ORAL | 6 refills | Status: DC

## 2017-06-08 NOTE — Addendum Note (Signed)
Addended by: Vaishali Baise, Sharlot Gowda on: 06/08/2017 04:09 PM   Modules accepted: Orders

## 2017-09-05 ENCOUNTER — Other Ambulatory Visit: Payer: Self-pay

## 2017-09-05 ENCOUNTER — Ambulatory Visit (HOSPITAL_COMMUNITY)
Admission: RE | Admit: 2017-09-05 | Discharge: 2017-09-05 | Disposition: A | Discharge status: Home / Self Care | Payer: BLUE CROSS/BLUE SHIELD | Source: Ambulatory Visit | Attending: Cardiology | Admitting: Cardiology

## 2017-09-05 VITALS — BP 115/84 | HR 63 | Wt 219.5 lb

## 2017-09-05 DIAGNOSIS — I25110 Atherosclerotic heart disease of native coronary artery with unstable angina pectoris: Secondary | ICD-10-CM | POA: Diagnosis not present

## 2017-09-05 DIAGNOSIS — I5022 Chronic systolic (congestive) heart failure: Secondary | ICD-10-CM | POA: Insufficient documentation

## 2017-09-05 DIAGNOSIS — I712 Thoracic aortic aneurysm, without rupture: Secondary | ICD-10-CM | POA: Insufficient documentation

## 2017-09-05 DIAGNOSIS — Z953 Presence of xenogenic heart valve: Secondary | ICD-10-CM | POA: Insufficient documentation

## 2017-09-05 DIAGNOSIS — E785 Hyperlipidemia, unspecified: Secondary | ICD-10-CM | POA: Diagnosis not present

## 2017-09-05 DIAGNOSIS — Z79899 Other long term (current) drug therapy: Secondary | ICD-10-CM | POA: Insufficient documentation

## 2017-09-05 DIAGNOSIS — Z8249 Family history of ischemic heart disease and other diseases of the circulatory system: Secondary | ICD-10-CM | POA: Diagnosis not present

## 2017-09-05 DIAGNOSIS — I252 Old myocardial infarction: Secondary | ICD-10-CM | POA: Diagnosis not present

## 2017-09-05 DIAGNOSIS — I2510 Atherosclerotic heart disease of native coronary artery without angina pectoris: Secondary | ICD-10-CM | POA: Diagnosis present

## 2017-09-05 DIAGNOSIS — Q231 Congenital insufficiency of aortic valve: Secondary | ICD-10-CM | POA: Diagnosis not present

## 2017-09-05 DIAGNOSIS — Z7982 Long term (current) use of aspirin: Secondary | ICD-10-CM | POA: Insufficient documentation

## 2017-09-05 DIAGNOSIS — I255 Ischemic cardiomyopathy: Secondary | ICD-10-CM | POA: Diagnosis not present

## 2017-09-05 DIAGNOSIS — Z951 Presence of aortocoronary bypass graft: Secondary | ICD-10-CM | POA: Insufficient documentation

## 2017-09-05 DIAGNOSIS — Z87891 Personal history of nicotine dependence: Secondary | ICD-10-CM | POA: Diagnosis not present

## 2017-09-05 DIAGNOSIS — I480 Paroxysmal atrial fibrillation: Secondary | ICD-10-CM | POA: Diagnosis not present

## 2017-09-05 DIAGNOSIS — I110 Hypertensive heart disease with heart failure: Secondary | ICD-10-CM | POA: Insufficient documentation

## 2017-09-05 LAB — BASIC METABOLIC PANEL
Anion gap: 10 (ref 5–15)
BUN: 16 mg/dL (ref 6–20)
CALCIUM: 9.4 mg/dL (ref 8.9–10.3)
CO2: 24 mmol/L (ref 22–32)
CREATININE: 1 mg/dL (ref 0.61–1.24)
Chloride: 101 mmol/L (ref 101–111)
GFR calc non Af Amer: >60 mL/min (ref >60)
Glucose, Bld: 99 mg/dL (ref 65–99)
Potassium: 4.1 mmol/L (ref 3.5–5.1)
SODIUM: 135 mmol/L (ref 135–145)

## 2017-09-05 NOTE — Progress Notes (Signed)
Patient ID: Tobyn Osgood, male   DOB: 10-11-1955, 62 y.o.   MRN: [id number] PCP: Dr. Lin Landsman Cardiology: Dr. Aundra Dubin  62 y.o. with history of HTN was admitted in 8/16 with acute systolic CHF => volume overloaded, short of breath.  Troponin was mildly elevated to 0.04.  Echo showed EF 30-35%.  He was diuresed and had cardiac cath.  This showed diffuse severe disease but the LAD disease was relatively mild to CABG not recommended.  He had PCI with DES to mLCx and proximal to mid RCA.  Cardiac MRI/MRA chest showed EF 39%, bicuspid aortic valve with probable moderate aortic stenosis, 4.9 cm ascending aorta.  MRA in 12/17 showed ascending aorta dimension up to 5.1 cm.  He saw Dr. Servando Snare for aorta evaluation. Echo in 4/18 showed EF 45% with inferolateral hypokinesis, moderate AS with mean gradient 38 mmHg and AVA 1.3 cm^2, mild to moderate aortic insufficiency, bicuspid aortic valve, ascending aorta 5.1 cm.  I talked with Dr. Servando Snare and decided that it was time for AVR + ascending aorta replacement.  LHC in 8/18 showed progressive disease as described below in HPI.   Patient had bioprosthetic AVR, ascending aorta replacement, and CABG with LIMA-LAD and SVG-ramus in 9/18. He did not have any significant peri-operative complications.  He was noted to have a run of atrial fibrillation at cardiac rehab.  30 day monitor worn in 11/18 showed episodes of atrial fibrillation.  I recommended starting DOAC and stopping ASA but he refused.  He does not tolerate statins. He qualifies for Repatha but has not wanted to take it.   I next recommended Zetia but he refused this also.   He returns for followup of CAD and valvular heart disease.  He has been doing well symptomatically.  He does not feel palpitations.  Today, he is actually in an accelerated junctional rhythm with rate around 100.  He is back at work. No significant exertional dyspnea.  He walks for 30 minutes on a treadmill on most days without problems.  No  chest pain.  No lightheadedness or syncope. He is doing cardiac rehab. Weight is stable.     ECG (2/19): accelerated junctional rhythm rate 100, LAFB, LVH with QRS widening  Labs (8/16): K 4.1, creatinine 1.0, HCT 42.6, LDL 200 Labs (9/16): K 4, creatinine 1.04, BNP 383 Labs (11/16): K 4.1, creatinine 0.99 Labs (12/16): LDL 152, HDL 37 (not taking Crestor) Labs (3/17): LDL 92, HDL 36, K 4.1, creatinine 0.92 Labs (6/17): LDL 83, HDL 38, K 3.8, creatinine 1.04 Labs (11/17): creatinine 0.94 Labs (9/18): K 4.4, creatinine 0.93 Labs (10/18): LDL 130 Labs (11/18): K 4.1, creatinine 0.9  PMH: 1. HTN 2. CAD: NSTEMI/CHF in 8/16.  LHC (8/16) showed 40-50% mLAD, 50% prox ramus, 80% mLCx, total occlusion moderate OM2, 50% pRCA, 80% mRCA, 50% dRCA.  He had DES to prox and mid RCA and mid LCx.   - LHC (8/18): 50-60% mLAD, 40-50% dLM, severe diffuse disease in LCx involving moderate ramus and 3 relatively small OMs.   - 9/18 CABG with LIMA-LAD and SVG-ramus.  3. Chronic systolic CHF: Ischemic cardiomyopathy.   - Echo (8/16) with EF 30-35%, diffuse hypokinesis, mild LVH, ?bicuspid aortic valve with mild AS and mild AI, 5.1 cm aortic root.  - Cardiac MRI (12/16): EF 39%, mild LV dilation and moderate LVH, inferior/inferolateral/anterolateral hypokinesis, LGE in the inferior and inferolateral walls, normal RV size and systolic function, mild aortic insufficiency, bicuspid aortic valve with probable moderate aortic stenosis.  -  Echo (4/17): EF 40-45%, inferolateral hypokinesis, bicuspid aortic valve with moderate aortic stenosis (mean gradient 33 mmHg), moderate AI, 4.5 cm aortic root. - Echo (4/18): EF 45% with inferolateral hypokinesis, moderate AS with mean gradient 38 mmHg and AVA 1.3 cm^2, mild to moderate aortic insufficiency, bicuspid aortic valve, ascending aorta 5.1 cm.  - Echo (11/18): EF 45-50%, mild LV dilation, bioprosthetic aortic valve with mean gradient 11 mmHg, mild mitral regurgitation.   4.  Hyperlipidemia: Myalgias with all statins.  5. Bicuspid aortic valve: Moderate AS by 12/16 MRI, mild AS by 8/16 echo.  Moderate AS/moderate AI by 4/17 echo.  - Echo (4/18) with moderate AS and mild to moderate AI.  Mean AoV gradient 38 mmHg with AVA 1.3 cm^2.  - Bioprosthetic aortic valve in 9/18.  6. Ascending aortic aneurysm: 4.9 cm ascending aorta by MRA chest in 12/16.  Aortic root 4.5 cm on 4/17 echo.  - MRA chest 12/17 with 5.1 cm ascending aorta.  - Echo 4/18 with 5.1 cm ascending aorta.  - Ascending aorta replaced in 9/18.  7. Carotid dopplers (7/17) with no significant disease.  8. Atrial fibrillation: Paroxysmal.   - 30 day monitor (11/18) with runs of atrial fibrillation noted.   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  Lives in [place].   FH: No CAD that he knows of. +HTN.  No FH of aneurysm or bicuspid aortic valve.   ROS: All systems reviewed and negative except as per HPI.   Current Outpatient Medications  Medication Sig Dispense Refill  . acetaminophen (TYLENOL) 325 MG tablet Take 2 tablets (650 mg total) by mouth every 6 (six) hours as needed for mild pain.    Marland Kitchen aspirin EC 81 MG tablet Take 1 tablet (81 mg total) by mouth daily.    . calcium citrate-vitamin D (CITRACAL+D) 315-200 MG-UNIT tablet Take 1 tablet by mouth daily.    . carvedilol (COREG) 6.25 MG tablet TAKE ONE TABLET BY MOUTH TWICE DAILY 60 tablet 11  . Coenzyme Q10 (COQ10) 400 MG CAPS Take 400 mg by mouth 2 (two) times daily.    Marland Kitchen docusate sodium (COLACE) 100 MG capsule Take 100 mg by mouth 2 (two) times daily as needed for mild constipation.    . fluticasone (FLONASE) 50 MCG/ACT nasal spray Place 2 sprays into both nostrils daily as needed for allergies or rhinitis.    . Lactobacillus (ULTIMATE PROBIOTIC FORMULA) CAPS Take 1 capsule by mouth daily.     Marland Kitchen lisinopril (PRINIVIL,ZESTRIL) 5 MG tablet Take 1 tablet (5 mg total) by mouth 2 (two) times daily. 60 tablet 6  .  Magnesium 400 MG TABS Take 400 mg by mouth 2 (two) times daily.     Marland Kitchen oxymetazoline (AFRIN) 0.05 % nasal spray Place 1 spray into both nostrils at bedtime as needed for congestion.    Marland Kitchen spironolactone (ALDACTONE) 25 MG tablet Take 12.5 mg by mouth daily.     No current facility-administered medications for this encounter.    BP 115/84   Pulse 63   Wt 219 lb 8 oz (99.6 kg)   SpO2 100%   BMI 31.49 kg/m  General: NAD Neck: No JVD, no thyromegaly or thyroid nodule.  Lungs: Clear to auscultation bilaterally with normal respiratory effort. CV: Nondisplaced PMI.  Heart regular S1/S2, no S3/S4, 2/6 SEM RUSB.  1+ right ankle edema, trace left ankle edema.  No carotid bruit.  Normal pedal pulses.  Abdomen: Soft, nontender, no hepatosplenomegaly, no  distention.  Skin: Intact without lesions or rashes.  Neurologic: Alert and oriented x 3.  Psych: Normal affect. Extremities: No clubbing or cyanosis.  HEENT: Normal.   Assessment/Plan: 1. CAD: S/p ACS 8/16 with DES to mLCx and proximal to mid RCA.  CABG with LIMA-LAD and SVG-ramus in 9/18. No ischemic symptoms. - Continue ASA 81.  - Cannot tolerate statins.  - He refuses to take Repatha, Praluent, or Zetia.  He has had extensive atherosclerosis so I think this is a bad idea and told him so, but he is adamant about managing his lipids with diet only.  Hopefully he will have some protection for at least a number of years from his CABG.   2. Chronic systolic CHF: Ischemic cardiomyopathy.  EF 30-35% by echo in 2016.  Cardiac MRI showed EF up to 39% in 12/16.  Echo 4/17 showed EF improved to 40-45%.  Echo in 4/18 with EF 45%.  Post-op echo in 11/18 with EF 45-50%.  NYHA class I-II symptoms now, looks euvolemic.  - Continue current Coreg.  - Continue lisinopril 5 mg bid.  - Continue current spironolactone.  - BMET today.  - Repeat echo in 11/19 to make sure EF remains improved.  3. Bicuspid aortic valve: moderate AI, moderate AS by echo in 4/17.   Bioprosthetic AVR in 9/18.  Valve looked ok on post-op echo in 11/18. 4. Hyperlipidemia: As above, cannot tolerate statins and refuses other agents despite my recommendation.   5. Ascending aortic aneurysm: 4.9 cm on MRA chest in 12/16.  Likely related to bicuspid aortic valve.  4.5 cm aortic root on 4/17 echo.  MRA in 12/17 and echo in 4/18 showed increase in ascending aorta dimension to 5.1 cm.  In 9/18, had ascending aorta repair. Will followup with Dr. Servando Snare. 6. Atrial fibrillation: Paroxysmal.  First noted at cardiac rehab, later noted on 30 day monitor in 11/18.  He is actually in an accelerated junctional rhythm today.  CHADSVASC = 3.  With most recent guidelines, he should be able to take a DOAC (has bioprosthetic - not mechanical - aortic valve).  I discussed stroke risk with him extensively and recommended that he replace ASA with a DOAC. He does not want to do this despite my recommendation.   Followup in 6 months.    Loralie Champagne 09/05/2017

## 2017-09-05 NOTE — Patient Instructions (Signed)
Labs drawn today (if we do not call you, then your lab work was stable)   Your physician recommends that you schedule a follow-up appointment in: 6 months (August, 2019)  with Dr. Aundra Dubin  Call and schedule appointment

## 2017-09-26 ENCOUNTER — Other Ambulatory Visit: Payer: Self-pay | Admitting: *Deleted

## 2017-09-26 DIAGNOSIS — I359 Nonrheumatic aortic valve disorder, unspecified: Secondary | ICD-10-CM

## 2017-10-10 ENCOUNTER — Encounter: Payer: BLUE CROSS/BLUE SHIELD | Admitting: Cardiothoracic Surgery

## 2017-11-16 ENCOUNTER — Encounter: Payer: Self-pay | Admitting: Cardiothoracic Surgery

## 2017-11-16 ENCOUNTER — Ambulatory Visit
Admission: RE | Admit: 2017-11-16 | Discharge: 2017-11-16 | Disposition: A | Discharge status: Home / Self Care | Payer: BLUE CROSS/BLUE SHIELD | Source: Ambulatory Visit | Attending: Cardiothoracic Surgery | Admitting: Cardiothoracic Surgery

## 2017-11-16 ENCOUNTER — Other Ambulatory Visit: Payer: Self-pay

## 2017-11-16 ENCOUNTER — Ambulatory Visit: Payer: BLUE CROSS/BLUE SHIELD | Admitting: Cardiothoracic Surgery

## 2017-11-16 ENCOUNTER — Other Ambulatory Visit (HOSPITAL_COMMUNITY): Payer: Self-pay | Admitting: *Deleted

## 2017-11-16 VITALS — BP 110/76 | HR 65 | Resp 16 | Ht 70.0 in | Wt 220.0 lb

## 2017-11-16 DIAGNOSIS — I712 Thoracic aortic aneurysm, without rupture: Secondary | ICD-10-CM

## 2017-11-16 DIAGNOSIS — I350 Nonrheumatic aortic (valve) stenosis: Secondary | ICD-10-CM

## 2017-11-16 DIAGNOSIS — I351 Nonrheumatic aortic (valve) insufficiency: Secondary | ICD-10-CM | POA: Diagnosis not present

## 2017-11-16 DIAGNOSIS — I7121 Aneurysm of the ascending aorta, without rupture: Secondary | ICD-10-CM

## 2017-11-16 DIAGNOSIS — Z952 Presence of prosthetic heart valve: Secondary | ICD-10-CM

## 2017-11-16 DIAGNOSIS — I359 Nonrheumatic aortic valve disorder, unspecified: Secondary | ICD-10-CM

## 2017-11-16 DIAGNOSIS — Z95828 Presence of other vascular implants and grafts: Secondary | ICD-10-CM

## 2017-11-16 MED ORDER — SPIRONOLACTONE 25 MG PO TABS: 12.5000 mg | ORAL_TABLET | Freq: Every day | ORAL | 3 refills | Status: DC

## 2017-11-16 MED ORDER — IOPAMIDOL (ISOVUE-370) INJECTION 76%
75.0000 mL | Freq: Once | INTRAVENOUS | Status: AC | PRN
  Administered 2017-11-16: 75 mL via INTRAVENOUS

## 2017-11-16 NOTE — Progress Notes (Signed)
[address]       ,[place] [address]             [phone]      William Contreras [facility] Medical Record #[id number] Date of Birth: 1956/01/01  Referring: Larey Dresser, MD Primary Care: Angelina Sheriff, MD  Chief Complaint:   POST OP FOLLOW UP 03/14/2017   OPERATIVE REPORT PREOPERATIVE DIAGNOSIS:  Aortic stenosis with probable bicuspid aortic valve, dilated ascending aorta, and coronary artery disease. POSTOPERATIVE DIAGNOSIS:  Aortic stenosis with probable bicuspid aortic valve, dilated ascending aorta, and coronary artery disease. SURGICAL PROCEDURE:  Aortic valve replacement with pericardial tissue valve, [employer], 25 mm, model 3300TFX, serial [id number]; replacement of ascending aorta with a 32-mm Hemashield woven Dacron graft; coronary artery bypass grafting x2 with the left internal mammary to the left anterior descending and reverse saphenous vein graft to the intermediate coronary artery with left thigh endovascular vein harvesting of the left greater saphenous vein.  History of Present Illness:     Patient returns to the office today 6 months following replacement as a sending aorta and aortic valve.  The patient notes he feels better than he has for many months.  He denies shortness of breath.  He denies being aware of any rapid heartbeat.  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       Past Medical History:  Diagnosis Date  . CHF (congestive heart failure) ([place])   . Coronary artery disease   . Essential hypertension   . GERD (gastroesophageal reflux disease)   . Heart murmur   . Morbid obesity ([place])   . Neuromuscular disorder ([place])    burning in both feet - pt. thinks its related to statin   . Sleep apnea    told that he was borderline, but he has lost 50 lbs & is feeling much better      Social History   Tobacco Use  Smoking Status Former Smoker  . Packs/day: 1.00  .  Years: 15.00  . Pack years: 15.00  . Types: Cigarettes  Smokeless Tobacco Never Used  Tobacco Comment   quit smoking ~ 20 yrs ago.    Social History   Substance and Sexual Activity  Alcohol Use No  . Alcohol/week: 0.0 oz   Comment: prev drank - none in ~ 25 yrs.     Allergies  Allergen Reactions  . Lipitor [Atorvastatin] Other (See Comments)    Joint and head pains  . Penicillins Other (See Comments)    Lightheadness Has patient had a PCN reaction causing immediate rash, facial/tongue/throat swelling, SOB or lightheadedness with hypotension:unknown Has patient had a PCN reaction causing severe rash involving mucus membranes or skin necrosis: No Has patient had a PCN reaction that required hospitalization: No Has patient had a PCN reaction occurring within the last 10 years: No If all of the above answers are "NO", then may proceed with Cephalosporin use. Childhood allergy rec'd Amoxicillin with     Current Outpatient Medications  Medication Sig Dispense Refill  . acetaminophen (TYLENOL) 325 MG tablet Take 2 tablets (650 mg total) by mouth every 6 (six) hours as needed for mild pain.    Marland Kitchen aspirin EC 81 MG tablet Take 1 tablet (81 mg total) by mouth daily.    . calcium citrate-vitamin D (CITRACAL+D) 315-200 MG-UNIT tablet Take 1 tablet by mouth daily.    . carvedilol (  COREG) 6.25 MG tablet TAKE ONE TABLET BY MOUTH TWICE DAILY 60 tablet 11  . Coenzyme Q10 (COQ10) 400 MG CAPS Take 400 mg by mouth 2 (two) times daily.    . Flax OIL Take 1 capsule by mouth daily.    . Lactobacillus (ULTIMATE PROBIOTIC FORMULA) CAPS Take 1 capsule by mouth daily.     Marland Kitchen lisinopril (PRINIVIL,ZESTRIL) 5 MG tablet Take 1 tablet (5 mg total) by mouth 2 (two) times daily. 60 tablet 6  . Magnesium 400 MG TABS Take 400 mg by mouth 2 (two) times daily.     . Omega-3 Fatty Acids (FISH OIL CONCENTRATE PO) Take 1 capsule by mouth daily.    Marland Kitchen oxymetazoline (AFRIN) 0.05 % nasal spray Place 1 spray into both  nostrils at bedtime as needed for congestion.    Marland Kitchen spironolactone (ALDACTONE) 25 MG tablet Take 12.5 mg by mouth daily.     No current facility-administered medications for this visit.        Physical Exam: BP 110/76 (BP Location: Right Arm, Patient Position: Sitting, Cuff Size: Normal)   Pulse 65   Resp 16   Ht 5\' 10"  (1.778 m)   Wt 220 lb (99.8 kg)   SpO2 98% Comment: RA  BMI 31.57 kg/m  General appearance: alert, cooperative and appears stated age Head: Normocephalic, without obvious abnormality, atraumatic Neck: no adenopathy, no carotid bruit, no JVD, supple, symmetrical, trachea midline and thyroid not enlarged, symmetric, no tenderness/mass/nodules Lymph nodes: Cervical, supraclavicular, and axillary nodes normal. Resp: clear to auscultation bilaterally Back: symmetric, no curvature. ROM normal. No CVA tenderness. Cardio: regular rate and rhythm, S1, S2 normal, no murmur, click, rub or gallop GI: soft, non-tender; bowel sounds normal; no masses,  no organomegaly Extremities: extremities normal, atraumatic, no cyanosis or edema and Homans sign is negative, no sign of DVT Neurologic: Grossly normal  Diagnostic Studies & Laboratory data:     Recent Radiology Findings:   Ct Angio Chest Aorta W/cm &/or Wo/cm  Result Date: 11/16/2017 CLINICAL DATA:  Status post prior aortic valve replacement, replacement of ascending thoracic aorta and CABG on 03/14/2017. EXAM: CT ANGIOGRAPHY CHEST WITH CONTRAST TECHNIQUE: Multidetector CT imaging of the chest was performed using the standard protocol during bolus administration of intravenous contrast. Multiplanar CT image reconstructions and MIPs were obtained to evaluate the vascular anatomy. CONTRAST:  44mL ISOVUE-370 IOPAMIDOL (ISOVUE-370) INJECTION 76% Creatinine was obtained on site at [facility] at [address] Results: Creatinine 1.0 mg/dL.  Estimated GFR 81 mL/minute COMPARISON:  01/26/2017 FINDINGS: Cardiovascular: Status  post aortic valve replacement and replacement of the ascending thoracic aorta. Valve and aortic graft have a normal postoperative appearance. No evidence of graft pseudoaneurysm. The native aortic root is normal in caliber. The native arch and descending thoracic aorta are normal in caliber. Proximal great vessels are widely patent. The heart size is within normal limits. Status post coronary artery bypass. No pericardial fluid identified. Central pulmonary arteries are normal in caliber. Mediastinum/Nodes: No enlarged mediastinal, hilar, or axillary lymph nodes. Thyroid gland, trachea, and esophagus demonstrate no significant findings. Lungs/Pleura: There is no evidence of pulmonary edema, consolidation, pneumothorax, nodule or pleural fluid. Upper Abdomen: No acute abnormality. Musculoskeletal: No chest wall abnormality. No acute or significant osseous findings. Review of the MIP images confirms the above findings. IMPRESSION: Normal expected postoperative appearance following aortic valve replacement, replacement of the ascending thoracic aorta with a tube graft and CABG. No evidence of complication. No aneurysmal disease of the native thoracic  aorta. Electronically Signed   By: Aletta Edouard M.D.   On: 11/16/2017 10:39  I have independently reviewed the above radiology studies  and reviewed the findings with the patient.     Recent Lab Findings: Lab Results  Component Value Date   WBC 7.5 05/11/2017   HGB 14.2 05/11/2017   HCT 41.3 05/11/2017   PLT 273 05/11/2017   GLUCOSE 99 09/05/2017   CHOL 214 (H) 04/14/2017   TRIG 231 (H) 04/14/2017   HDL 38 (L) 04/14/2017   LDLCALC 130 (H) 04/14/2017   ALT 20 03/09/2017   AST 21 03/09/2017   NA 135 09/05/2017   K 4.1 09/05/2017   CL 101 09/05/2017   CREATININE 1.00 09/05/2017   BUN 16 09/05/2017   CO2 24 09/05/2017   TSH 0.402 03/15/2017   INR 1.38 03/14/2017   HGBA1C 5.1 03/09/2017      Assessment / Plan: Status post aortic valve  replacement and ascending aortic replacement, stable on CTA of the chest today Echocardiogram a month postop shows improved LV function 45 to 50% ejection fraction Patient has no symptoms of congestive heart failure, syncope or angina. I again reviewed with he and his wife the need for antibiotic prophylaxis with an artificial tissue valve anytime he has invasive procedures including dental work.  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  He has had a postop stress test and echocardiogram.  Plan see the patient back in 1 year with a CTA of the chest, he has a follow-up appointment with cardiology August 2019.    Grace Isaac MD      [address] [place] [address] Office [phone]   Beeper [phone]  11/16/2017 11:44 AM

## 2017-12-25 IMAGING — DX DG CHEST 1V PORT
1 series · 1 of 1 positions shown · non-contrast
Comparison: 03/09/2017, CT 01/26/2017

CLINICAL DATA: Status post AVR

EXAM:
PORTABLE CHEST 1 VIEW

[chest ap]
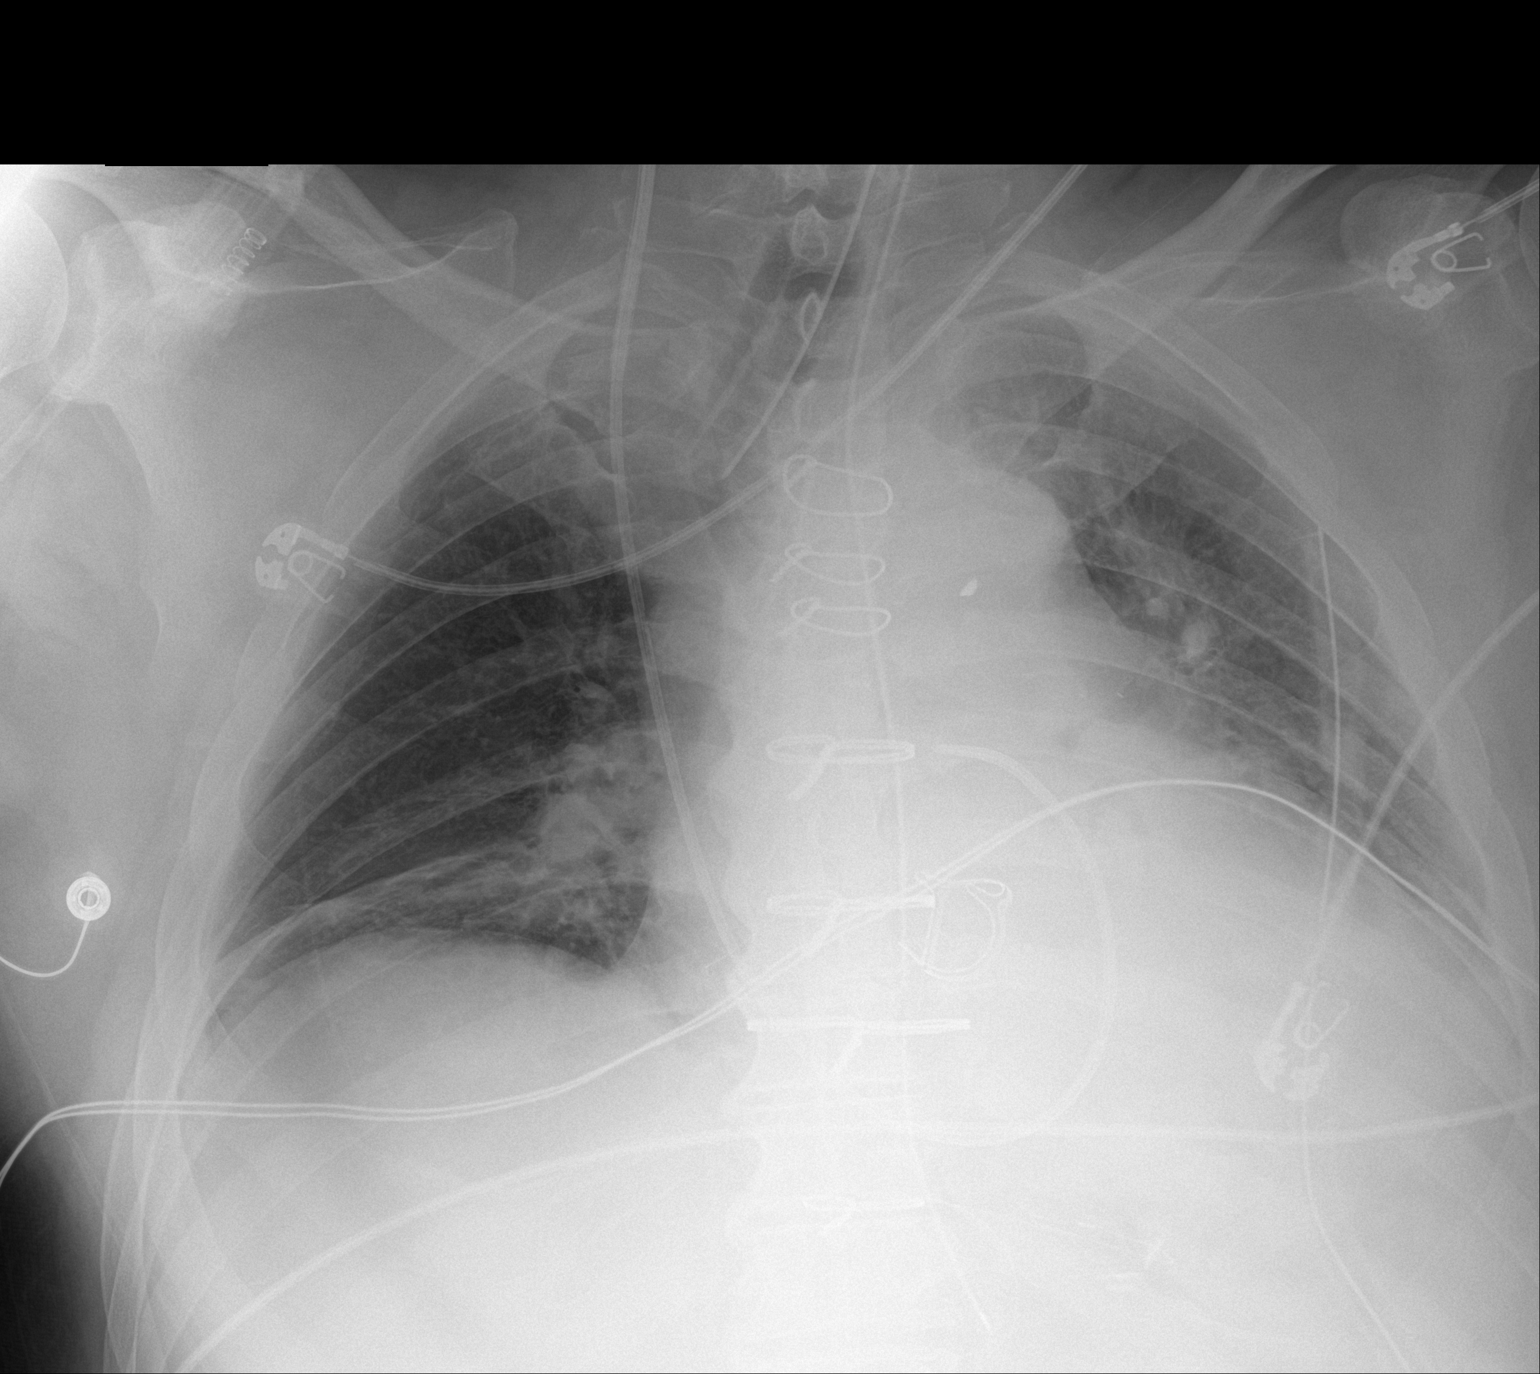

[1 of 1 positions shown; findings below may reference images not displayed]

FINDINGS: Endotracheal tube tip, slightly deviates the trachea to the right,
the tip is visualized about 4.2 cm superior to the carina.
Esophageal tube tip in the region of GE junction with side port
overlying the distal esophagus. Post sternotomy changes and valve
prosthesis. Right IJ Swan-Ganz catheter with tip directed to the
right, over the pulmonary confluence. Left-sided chest tube. Low
lung volumes. Bandlike atelectasis at the right base. Cardiomegaly
with consolidation at the left lung base. No pneumothorax.
IMPRESSION: 1. Support lines and tubes as above. Endotracheal tube tip directed
to the patient's right and slightly deviates the trachea to the
right, the tip is about 4.2 cm superior to the carina
2. Esophageal tube tip overlies the GE junction and the side port
projects over distal esophagus, suggest further advancement for more
optimal positioning
3. Interval postsurgical changes. Platelike atelectasis at the right
base with consolidation at the left lung base.
4. Cardiomegaly.

## 2018-01-02 ENCOUNTER — Other Ambulatory Visit (HOSPITAL_COMMUNITY): Payer: Self-pay | Admitting: Cardiology

## 2018-01-31 IMAGING — CR DG CHEST 2V
2 series · 2 of 2 positions shown · non-contrast
Comparison: 03/18/2017

CLINICAL DATA: History CABG.  Soreness.

EXAM:
CHEST  2 VIEW

[w chest pa]
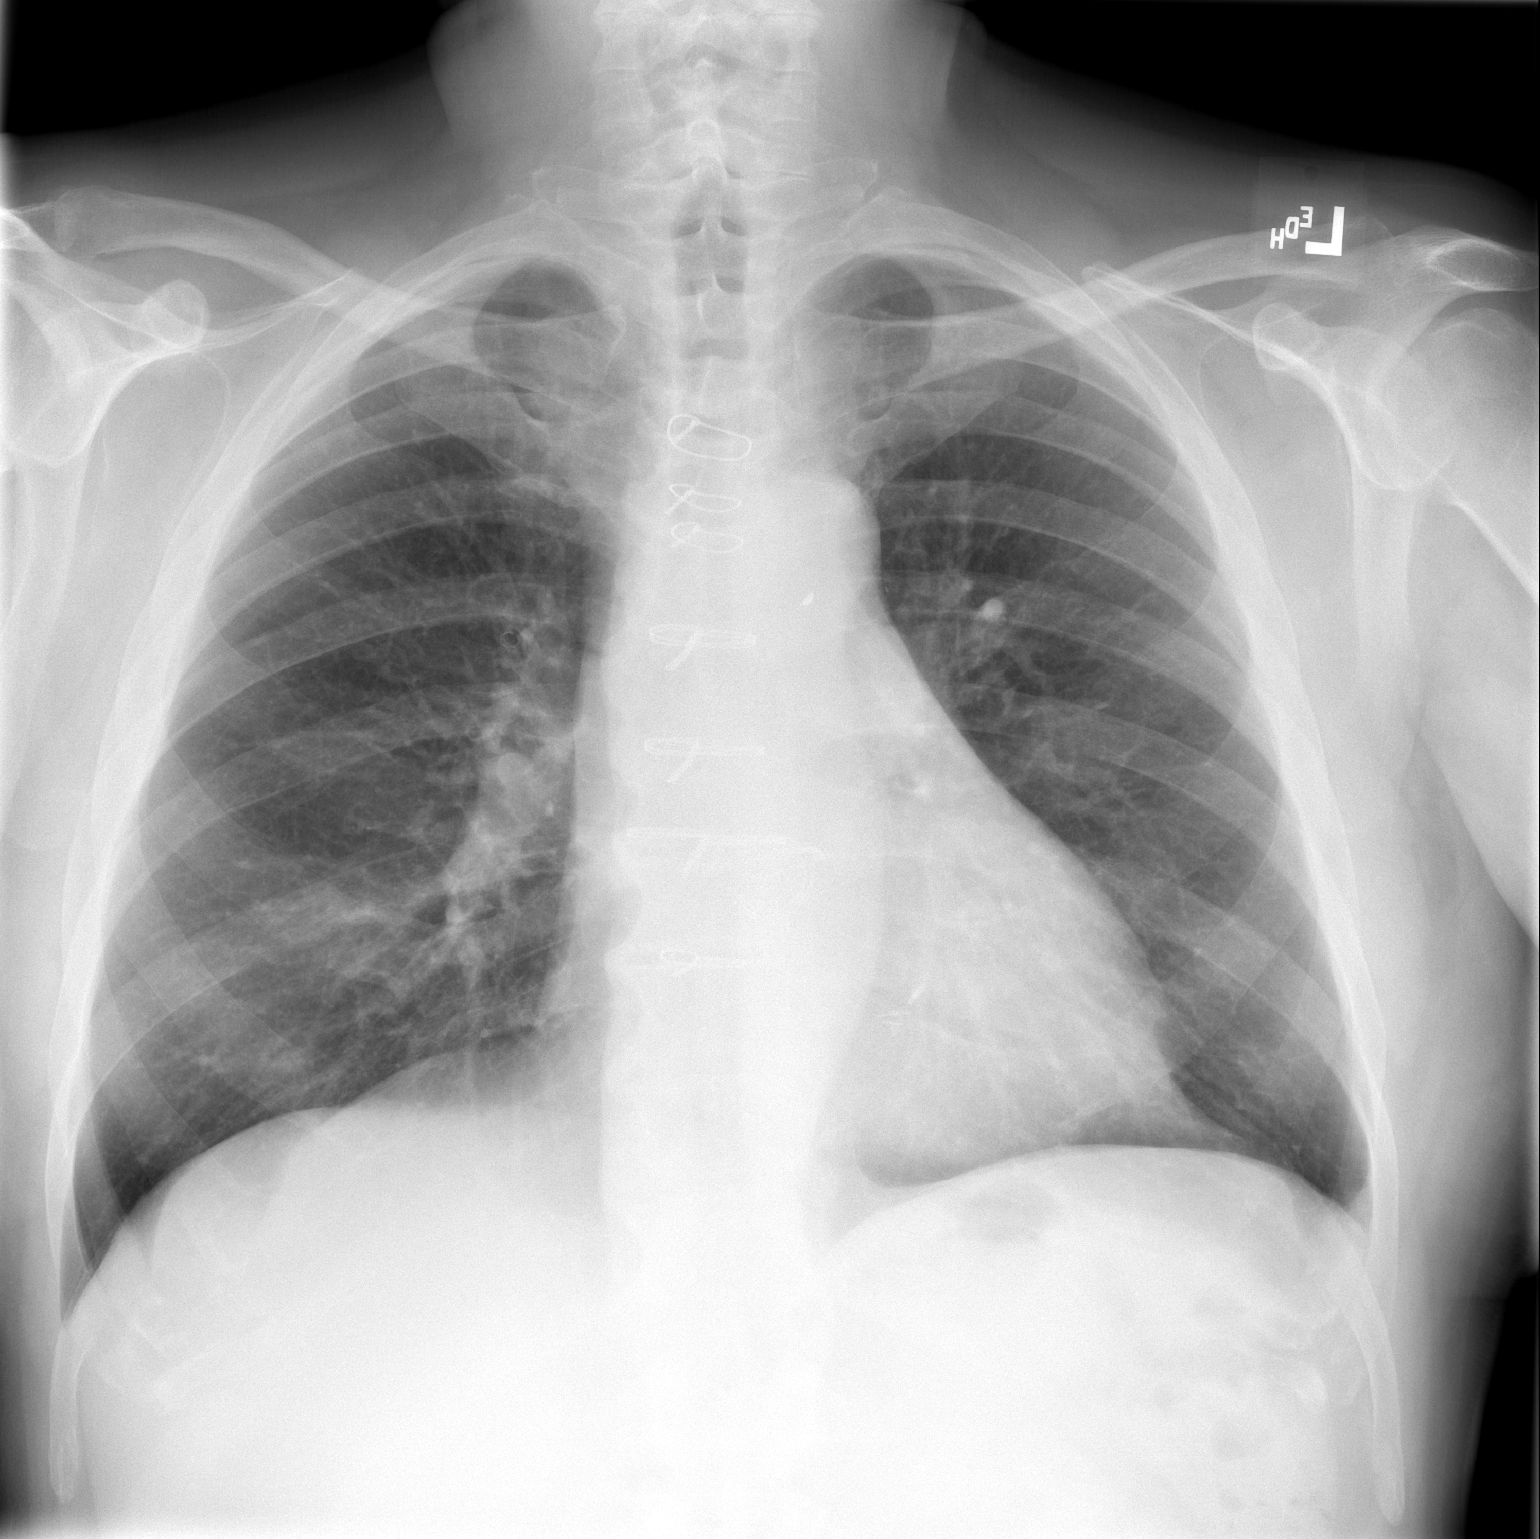

[w chest lat]
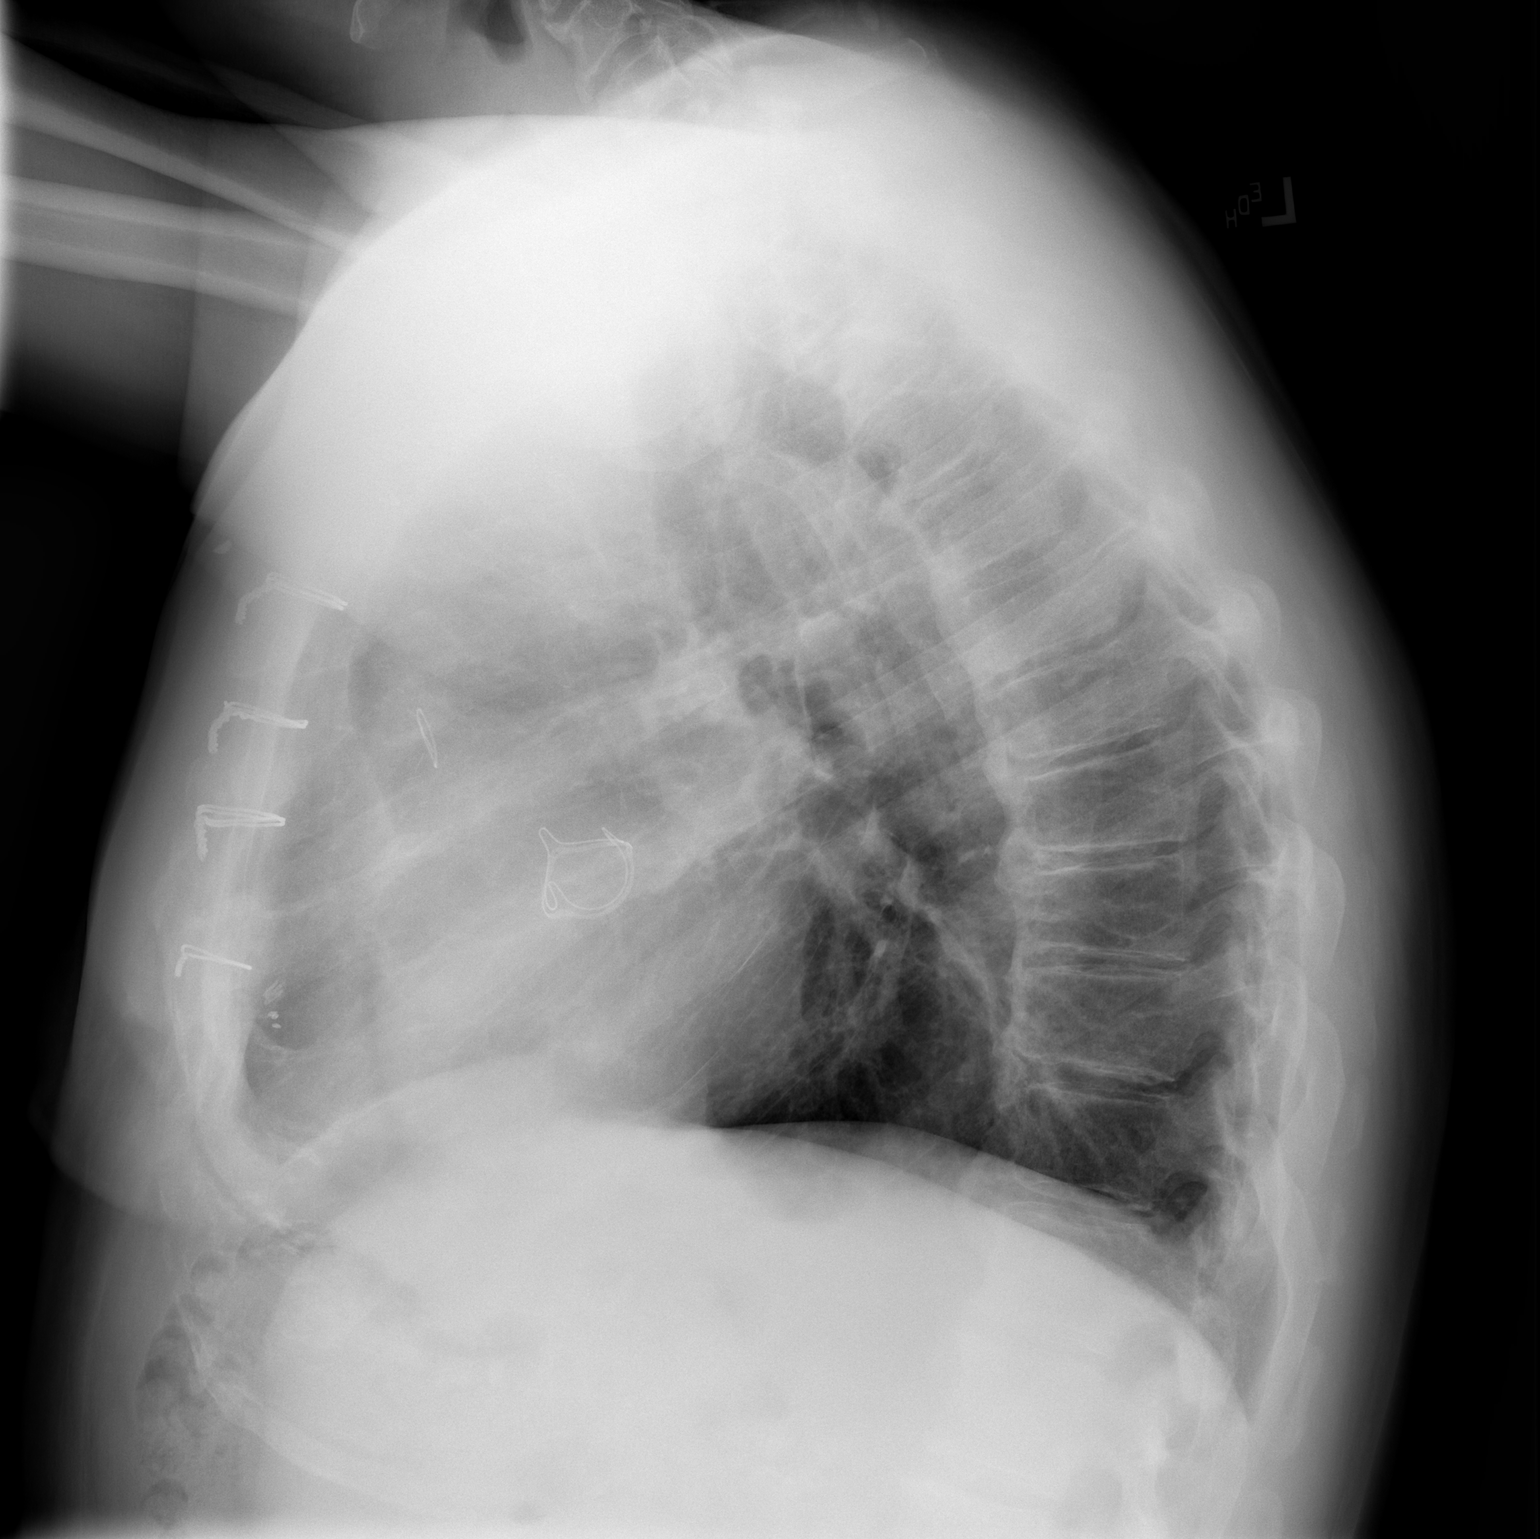

[2 of 2 positions shown; findings below may reference images not displayed]

FINDINGS: There is no focal parenchymal opacity. There is no pleural effusion
or pneumothorax. The heart and mediastinal contours are
unremarkable. There is evidence of prior CABG.

The osseous structures are unremarkable.
IMPRESSION: No active cardiopulmonary disease.

## 2018-03-13 ENCOUNTER — Other Ambulatory Visit: Payer: Self-pay

## 2018-03-13 DIAGNOSIS — I83893 Varicose veins of bilateral lower extremities with other complications: Secondary | ICD-10-CM

## 2018-03-14 ENCOUNTER — Ambulatory Visit (HOSPITAL_COMMUNITY)
Admission: RE | Admit: 2018-03-14 | Discharge: 2018-03-14 | Disposition: A | Discharge status: Home / Self Care | Payer: BLUE CROSS/BLUE SHIELD | Source: Ambulatory Visit | Attending: Vascular Surgery | Admitting: Vascular Surgery

## 2018-03-14 ENCOUNTER — Encounter: Payer: Self-pay | Admitting: Vascular Surgery

## 2018-03-14 ENCOUNTER — Ambulatory Visit: Payer: BLUE CROSS/BLUE SHIELD | Admitting: Vascular Surgery

## 2018-03-14 VITALS — BP 136/80 | HR 72 | Temp 98.0°F | Resp 16 | Ht 70.0 in | Wt 220.0 lb

## 2018-03-14 DIAGNOSIS — I872 Venous insufficiency (chronic) (peripheral): Secondary | ICD-10-CM | POA: Diagnosis not present

## 2018-03-14 DIAGNOSIS — I83893 Varicose veins of bilateral lower extremities with other complications: Secondary | ICD-10-CM | POA: Diagnosis not present

## 2018-03-14 DIAGNOSIS — I83813 Varicose veins of bilateral lower extremities with pain: Secondary | ICD-10-CM

## 2018-03-14 NOTE — Progress Notes (Signed)
REASON FOR CONSULT:    Bilateral varicose veins.  This is a self-referral.    HPI:   William Contreras is a pleasant 62 y.o. male, with a long history of varicose veins of both lower extremities.  He describes significant aching pain heaviness and a tired feeling in his legs which is aggravated by sitting and standing and relieved somewhat with elevation.  He typically does not take ibuprofen for pain.  He has been wearing thigh-high compression stockings with a gradient of 20 to 30 mmHg for several years.  Of note, he underwent coronary revascularization in September 2018.  They apparently looked at the right great saphenous vein with the endoscope and the vein was too large to use.  The left great saphenous vein was harvested for his bypass and he states that his left leg felt better after that.  The patient in addition to wearing his compression stockings does try to elevate his legs some.  He is unaware of any previous history of DVT or phlebitis.  He did have an aortic valve replacement with a tissue valve and is not on anticoagulation.  Past Medical History:  Diagnosis Date  . CHF (congestive heart failure) ([place])   . Coronary artery disease   . Essential hypertension   . GERD (gastroesophageal reflux disease)   . Heart murmur   . Morbid obesity ([place])   . Neuromuscular disorder ([place])    burning in both feet - pt. thinks its related to statin   . Sleep apnea    told that he was borderline, but he has lost 50 lbs & is feeling much better     Family History  Problem Relation Age of Onset  . Other Father        died young in [place].  Marland Kitchen Heart attack Mother        alive @ 31.  . Other Brother        A & W  . Other Sister        A & W    SOCIAL HISTORY: Social History   Socioeconomic History  . Marital status: Married    Spouse name: Not on file  . Number of children: Not on file  . Years of education: Not on file  . Highest education level: Not on file  Occupational  History  . Not on file  Social Needs  . Financial resource strain: Not on file  . Food insecurity:    Worry: Not on file    Inability: Not on file  . Transportation needs:    Medical: Not on file    Non-medical: Not on file  Tobacco Use  . Smoking status: Former Smoker    Packs/day: 1.00    Years: 15.00    Pack years: 15.00    Types: Cigarettes  . Smokeless tobacco: Never Used  . Tobacco comment: quit smoking ~ 20 yrs ago.  Substance and Sexual Activity  . Alcohol use: No    Alcohol/week: 0.0 standard drinks    Comment: prev drank - none in ~ 25 yrs.  . Drug use: No    Comment: prev smoked MJ, none in 25 yrs.  . Sexual activity: Yes  Lifestyle  . Physical activity:    Days per week: Not on file    Minutes per session: Not on file  . Stress: Not on file  Relationships  . Social connections:    Talks on phone: Not on file    Gets together:  Not on file    Attends religious service: Not on file    Active member of club or organization: Not on file    Attends meetings of clubs or organizations: Not on file    Relationship status: Not on file  . Intimate partner violence:    Fear of current or ex partner: Not on file    Emotionally abused: Not on file    Physically abused: Not on file    Forced sexual activity: Not on file  Other Topics Concern  . Not on file  Social History Narrative   Pt lives in [place] with his wife and son.  He has 2 step children and another child - all grown and out of the house.    Allergies  Allergen Reactions  . Lipitor [Atorvastatin] Other (See Comments)    Joint and head pains  . Penicillins Other (See Comments)    Lightheadness Has patient had a PCN reaction causing immediate rash, facial/tongue/throat swelling, SOB or lightheadedness with hypotension:unknown Has patient had a PCN reaction causing severe rash involving mucus membranes or skin necrosis: No Has patient had a PCN reaction that required hospitalization: No Has patient  had a PCN reaction occurring within the last 10 years: No If all of the above answers are "NO", then may proceed with Cephalosporin use. Childhood allergy rec'd Amoxicillin with     Current Outpatient Medications  Medication Sig Dispense Refill  . acetaminophen (TYLENOL) 325 MG tablet Take 2 tablets (650 mg total) by mouth every 6 (six) hours as needed for mild pain.    Marland Kitchen aspirin EC 81 MG tablet Take 1 tablet (81 mg total) by mouth daily.    . calcium citrate-vitamin D (CITRACAL+D) 315-200 MG-UNIT tablet Take 1 tablet by mouth daily.    . carvedilol (COREG) 6.25 MG tablet TAKE ONE TABLET BY MOUTH TWICE DAILY 60 tablet 11  . Coenzyme Q10 (COQ10) 400 MG CAPS Take 400 mg by mouth 2 (two) times daily.    . Flax OIL Take 1 capsule by mouth daily.    . Lactobacillus (ULTIMATE PROBIOTIC FORMULA) CAPS Take 1 capsule by mouth daily.     Marland Kitchen lisinopril (PRINIVIL,ZESTRIL) 5 MG tablet TAKE 1 TABLET BY MOUTH TWICE DAILY 60 tablet 6  . Magnesium 400 MG TABS Take 400 mg by mouth 2 (two) times daily.     . Omega-3 Fatty Acids (FISH OIL CONCENTRATE PO) Take 1 capsule by mouth daily.    Marland Kitchen oxymetazoline (AFRIN) 0.05 % nasal spray Place 1 spray into both nostrils at bedtime as needed for congestion.    Marland Kitchen spironolactone (ALDACTONE) 25 MG tablet Take 0.5 tablets (12.5 mg total) by mouth daily. 45 tablet 3   No current facility-administered medications for this visit.     REVIEW OF SYSTEMS:  [X]  denotes positive finding, [ ]  denotes negative finding Cardiac  Comments:  Chest pain or chest pressure:    Shortness of breath upon exertion:    Short of breath when lying flat:    Irregular heart rhythm:        Vascular    Pain in calf, thigh, or hip brought on by ambulation: x   Pain in feet at night that wakes you up from your sleep:     Blood clot in your veins:    Leg swelling:  x       Pulmonary    Oxygen at home:    Productive cough:     Wheezing:  Neurologic    Sudden weakness in arms or  legs:     Sudden numbness in arms or legs:     Sudden onset of difficulty speaking or slurred speech:    Temporary loss of vision in one eye:     Problems with dizziness:         Gastrointestinal    Blood in stool:     Vomited blood:         Genitourinary    Burning when urinating:     Blood in urine:        Psychiatric    Major depression:         Hematologic    Bleeding problems:    Problems with blood clotting too easily:        Skin    Rashes or ulcers:        Constitutional    Fever or chills:     PHYSICAL EXAM:   Vitals:   03/14/18 1348  BP: 136/80  Pulse: 72  Resp: 16  Temp: 98 F (36.7 C)  SpO2: 96%  Weight: 220 lb (99.8 kg)  Height: 5\' 10"  (1.778 m)    GENERAL: The patient is a well-nourished male, in no acute distress. The vital signs are documented above. CARDIAC: There is a regular rate and rhythm.  VASCULAR:  ARTERIAL: I do not detect carotid bruits. He has palpable femoral and pedal pulses bilaterally. VENOUS: He has significantly dilated truncal varicosities along the medial aspect of his right calf and posterior calf. I did look at his great saphenous vein on the right with the SonoSite and this is significantly dilated with significant reflux all the way down to the knee.  Looks like it could be accesse just below the knee.  Below that it gives off some large varicose veins. PULMONARY: There is good air exchange bilaterally without wheezing or rales. ABDOMEN: Soft and non-tender with normal pitched bowel sounds.  MUSCULOSKELETAL: There are no major deformities or cyanosis. NEUROLOGIC: No focal weakness or paresthesias are detected. SKIN: There are no ulcers or rashes noted. PSYCHIATRIC: The patient has a normal affect.  DATA:    VENOUS DUPLEX: I have independently interpreted his venous duplex scan today.  On the right side there is no evidence of deep venous thrombosis and no evidence of superficial venous thrombosis.  There is deep venous  reflux involving the common femoral vein.  There is superficial venous reflux involving the great saphenous vein from the knee to the proximal thigh.  The vein is significantly dilated.  On the left side there is no evidence of deep venous thrombosis or superficial thrombophlebitis.  There is superficial venous reflux in the great saphenous vein in the proximal calf.   ASSESSMENT & PLAN:   CHRONIC VENOUS INSUFFICIENCY: This patient has significantly elevated venous pressure from reflux in the right great saphenous vein with large tributary varicose veins contributing to his symptoms.  He is failed conservative treatment including thigh-high compression stockings with a gradient of 20 to 30 mmHg, leg elevation, and ibuprofen.  I think he is a good candidate for endovenous laser ablation of the right great saphenous vein and 10-20 stab phlebectomies. I have discussed the indications for endovenous laser ablation of the right GSV, that is to lower the pressure in the veins and potentially help relieve the symptoms from venous hypertension. I have also discussed alternative options including conservative treatment with leg elevation, compression therapy, exercise, avoiding prolonged sitting and standing, and weight  management. I have discussed the potential complications of the procedure, including, but not limited to: bleeding, bruising, leg swelling, nerve injury, skin burns, significant pain from phlebitis, deep venous thrombosis, or failure of the vein to close.  I have also explained that venous insufficiency is a chronic disease, and that the patient is at risk for recurrent varicose veins in the future.  I have also discussed with the patient the indications for stab phlebectomy.  I have explained to the patient that that will have small scars from the stab incisions.  I explained that the other risks include leg swelling, bruising, bleeding, and phlebitis.  All the patient's questions were encouraged and  answered and they are agreeable to proceed.  In addition, I have discussed with patient the importance of conservative measures including leg elevation and the proper positioning for this.  The need to avoid prolonged sitting and standing, and the importance of exercise.  We will schedule his procedure in the near future.   Deitra Mayo [facility] [phone]

## 2018-03-20 ENCOUNTER — Other Ambulatory Visit: Payer: Self-pay | Admitting: *Deleted

## 2018-03-20 DIAGNOSIS — I83811 Varicose veins of right lower extremities with pain: Secondary | ICD-10-CM

## 2018-04-05 ENCOUNTER — Encounter: Payer: Self-pay | Admitting: Vascular Surgery

## 2018-04-05 ENCOUNTER — Ambulatory Visit (INDEPENDENT_AMBULATORY_CARE_PROVIDER_SITE_OTHER): Payer: BLUE CROSS/BLUE SHIELD | Admitting: Vascular Surgery

## 2018-04-05 VITALS — BP 127/84 | HR 63 | Temp 98.0°F | Resp 16 | Ht 70.0 in | Wt 218.0 lb

## 2018-04-05 DIAGNOSIS — I83813 Varicose veins of bilateral lower extremities with pain: Secondary | ICD-10-CM | POA: Diagnosis not present

## 2018-04-05 NOTE — Progress Notes (Signed)
     Laser Ablation Procedure    Date: 04/05/2018   Arturo Stocking DOB:01-Oct-1955  Consent signed: Yes    Surgeon:  Dr. Sherren Mocha Early  Procedure: Laser Ablation: right Greater Saphenous Vein  BP 127/84   Pulse 63   Temp 98 F (36.7 C)   Resp 16   Ht 5\' 10"  (1.778 m)   Wt 218 lb (98.9 kg)   SpO2 99%   BMI 31.28 kg/m   Tumescent Anesthesia: 550 cc 0.9% NaCl with 50 cc Lidocaine HCL with 1% Epi and 15 cc 8.4% NaHCO3  Local Anesthesia: 15 cc Lidocaine HCL and NaHCO3 (ratio 2:1)  15 watts continuous mode        Total energy: 2260   Total time: 1:29    Stab Phlebectomy: 10-20 Sites: Calf  Patient tolerated procedure well  Notes:   Description of Procedure:  After marking the course of the secondary varicosities, the patient was placed on the operating table in the supine position, and the right leg was prepped and draped in sterile fashion.   Local anesthetic was administered and under ultrasound guidance the saphenous vein was accessed with a micro needle and guide wire; then the mirco puncture sheath was placed.  A guide wire was inserted saphenofemoral junction , followed by a 5 french sheath.  The position of the sheath and then the laser fiber below the junction was confirmed using the ultrasound.  Tumescent anesthesia was administered along the course of the saphenous vein using ultrasound guidance. The patient was placed in Trendelenburg position and protective laser glasses were placed on patient and staff, and the laser was fired at 15 watts continuous mode advancing 1-26mm/second for a total of 2260 joules.   For stab phlebectomies, local anesthetic was administered at the previously marked varicosities, and tumescent anesthesia was administered around the vessels.  Ten to 20 stab wounds were made using the tip of an 11 blade. And using the vein hook, the phlebectomies were performed using a hemostat to avulse the varicosities.  Adequate hemostasis was achieved.      Steri strips were applied to the stab wounds and ABD pads and thigh high compression stockings were applied.  Ace wrap bandages were applied over the phlebectomy sites and at the top of the saphenofemoral junction. Blood loss was less than 15 cc.  The patient ambulated out of the operating room having tolerated the procedure well.

## 2018-04-05 NOTE — Progress Notes (Signed)
Patient name: William Contreras MRN: [id number] DOB: 08/13/1955 Sex: male  REASON FOR VISIT:   For endovenous laser ablation of the right great saphenous vein and 10-20 stab phlebectomies  HPI:   William Contreras is a pleasant 62 y.o. male who I saw on 03/14/2018 with bilateral painful varicose veins.  He had failed conservative treatment.  Of note his left great saphenous vein has been harvested for previous coronary revascularization.  They had apparently looked to the right great saphenous vein with the endoscope and the vein was too large to use for coronary bypass grafting.  The patient has had an aortic valve replacement with a tissue valve and is not on oral anticoagulation.  I did look at his great saphenous vein on the right with the SonoSite and this was significantly dilated with reflux down to the knee.  I felt that it could likely be accessed just below the knee.  Below that it gives off some large varicose veins.  Current Outpatient Medications  Medication Sig Dispense Refill  . acetaminophen (TYLENOL) 325 MG tablet Take 2 tablets (650 mg total) by mouth every 6 (six) hours as needed for mild pain.    Marland Kitchen aspirin EC 81 MG tablet Take 1 tablet (81 mg total) by mouth daily.    . calcium citrate-vitamin D (CITRACAL+D) 315-200 MG-UNIT tablet Take 1 tablet by mouth daily.    . carvedilol (COREG) 6.25 MG tablet TAKE ONE TABLET BY MOUTH TWICE DAILY 60 tablet 11  . Coenzyme Q10 (COQ10) 400 MG CAPS Take 400 mg by mouth 2 (two) times daily.    . Flax OIL Take 1 capsule by mouth daily.    . Lactobacillus (ULTIMATE PROBIOTIC FORMULA) CAPS Take 1 capsule by mouth daily.     Marland Kitchen lisinopril (PRINIVIL,ZESTRIL) 5 MG tablet TAKE 1 TABLET BY MOUTH TWICE DAILY 60 tablet 6  . Magnesium 400 MG TABS Take 400 mg by mouth 2 (two) times daily.     . Omega-3 Fatty Acids (FISH OIL CONCENTRATE PO) Take 1 capsule by mouth daily.    Marland Kitchen oxymetazoline (AFRIN) 0.05 % nasal spray Place 1 spray into both nostrils at  bedtime as needed for congestion.    Marland Kitchen spironolactone (ALDACTONE) 25 MG tablet Take 0.5 tablets (12.5 mg total) by mouth daily. 45 tablet 3   No current facility-administered medications for this visit.     REVIEW OF SYSTEMS:  [X]  denotes positive finding, [ ]  denotes negative finding Vascular    Leg swelling    Cardiac    Chest pain or chest pressure:    Shortness of breath upon exertion:    Short of breath when lying flat:    Irregular heart rhythm:    Constitutional    Fever or chills:     PHYSICAL EXAM:   Vitals:   04/05/18 1040  BP: 127/84  Pulse: 63  Resp: 16  Temp: 98 F (36.7 C)  SpO2: 99%  Weight: 218 lb (98.9 kg)  Height: 5\' 10"  (1.778 m)    GENERAL: The patient is a well-nourished male, in no acute distress. The vital signs are documented above.  DATA:   No new data  MEDICAL ISSUES:   ENDOVENOUS LASER ABLATION RIGHT GREAT SAPHENOUS VEIN WITH 10-20 STAB PHLEBECTOMIES: The patient was taken to the exam room and the veins were marked with the patient standing.  The patient was then placed supine.  I had interrogated the saphenous vein with the duplex scanner and it looks like the best  point to cannulate the vein was just below the knee.  The vein was markedly dilated as below the saphenofemoral junction in the proximal thigh.  Right leg was prepped and draped in usual sterile fashion.  Under ultrasound guidance, after the skin was anesthetized, the saphenous vein was cannulated just below the knee with a micropuncture needle and a micropuncture sheath introduced over the wire.  A J-wire was advanced up to just below the saphenofemoral junction under vision using ultrasound.  Sheath was then advanced advanced at this level and the dilator and wire removed.  The laser fiber was then positioned within the sheath to just below the saphenofemoral junction.  The sheath was retracted.  Tumescent anesthesia was then introduced the entire length of the great saphenous vein  creating a circumferential area of anesthesia around the vein.  Patient was then placed in Trendelenburg.  Laser was performed from just below the saphenofemoral junction to just below the knee.  A total of 2260 J of energy were used.  Attention was then turned to the stab phlebectomies.  After the skin was anesthetized with tumescent anesthesia multiple stabs (approximately 15-20) were used and the veins were hooked brought into the wound and then gently bluntly excised using blunt dissection using hemostat.  Pressure was held for hemostasis.  Sterile dressing was applied.  Patient tolerated the procedure well.  Deitra Mayo [facility] [phone]

## 2018-04-10 ENCOUNTER — Encounter: Payer: Self-pay | Admitting: Vascular Surgery

## 2018-04-12 ENCOUNTER — Encounter: Payer: Self-pay | Admitting: Vascular Surgery

## 2018-04-12 ENCOUNTER — Ambulatory Visit (INDEPENDENT_AMBULATORY_CARE_PROVIDER_SITE_OTHER): Payer: BLUE CROSS/BLUE SHIELD | Admitting: Vascular Surgery

## 2018-04-12 ENCOUNTER — Ambulatory Visit (HOSPITAL_COMMUNITY)
Admission: RE | Admit: 2018-04-12 | Discharge: 2018-04-12 | Disposition: A | Discharge status: Home / Self Care | Payer: BLUE CROSS/BLUE SHIELD | Source: Ambulatory Visit | Attending: Vascular Surgery | Admitting: Vascular Surgery

## 2018-04-12 VITALS — BP 148/83 | HR 58 | Temp 98.0°F | Resp 16 | Ht 70.0 in | Wt 218.0 lb

## 2018-04-12 DIAGNOSIS — I868 Varicose veins of other specified sites: Secondary | ICD-10-CM

## 2018-04-12 DIAGNOSIS — I83811 Varicose veins of right lower extremities with pain: Secondary | ICD-10-CM

## 2018-04-12 NOTE — Progress Notes (Signed)
Patient name: Kilan Banfill MRN: [id number] DOB: 05-18-1956 Sex: male  REASON FOR VISIT:   Follow-up after endovenous laser ablation of the right great saphenous vein with 10-20 stab phlebectomies  HPI:   Lamari Beckles is a pleasant 62 y.o. male who underwent endovenous laser ablation of the right great saphenous vein and 10-20 stab phlebectomies on 04/05/2018.  Comes in for a one-week follow-up visit.  He states the swelling in the right leg has improved significantly.  He has no significant discomfort.  He has been wearing his compression stockings.  He has been elevating his leg.  Of note he did have a recent bleeding episode from a varicose vein in the anterior right leg adjacent to some small spider veins.  Current Outpatient Medications  Medication Sig Dispense Refill  . acetaminophen (TYLENOL) 325 MG tablet Take 2 tablets (650 mg total) by mouth every 6 (six) hours as needed for mild pain.    Marland Kitchen aspirin EC 81 MG tablet Take 1 tablet (81 mg total) by mouth daily.    . calcium citrate-vitamin D (CITRACAL+D) 315-200 MG-UNIT tablet Take 1 tablet by mouth daily.    . carvedilol (COREG) 6.25 MG tablet TAKE ONE TABLET BY MOUTH TWICE DAILY 60 tablet 11  . Coenzyme Q10 (COQ10) 400 MG CAPS Take 400 mg by mouth 2 (two) times daily.    . Flax OIL Take 1 capsule by mouth daily.    . Lactobacillus (ULTIMATE PROBIOTIC FORMULA) CAPS Take 1 capsule by mouth daily.     Marland Kitchen lisinopril (PRINIVIL,ZESTRIL) 5 MG tablet TAKE 1 TABLET BY MOUTH TWICE DAILY 60 tablet 6  . Magnesium 400 MG TABS Take 400 mg by mouth 2 (two) times daily.     . Omega-3 Fatty Acids (FISH OIL CONCENTRATE PO) Take 1 capsule by mouth daily.    Marland Kitchen oxymetazoline (AFRIN) 0.05 % nasal spray Place 1 spray into both nostrils at bedtime as needed for congestion.    Marland Kitchen spironolactone (ALDACTONE) 25 MG tablet Take 0.5 tablets (12.5 mg total) by mouth daily. 45 tablet 3   No current facility-administered medications for this visit.      REVIEW OF SYSTEMS:  [X]  denotes positive finding, [ ]  denotes negative finding Vascular    Leg swelling    Cardiac    Chest pain or chest pressure:    Shortness of breath upon exertion:    Short of breath when lying flat:    Irregular heart rhythm:    Constitutional    Fever or chills:     PHYSICAL EXAM:   Vitals:   04/12/18 1013  BP: (!) 148/83  Pulse: (!) 58  Resp: 16  Temp: 98 F (36.7 C)  SpO2: 99%  Weight: 218 lb (98.9 kg)  Height: 5\' 10"  (1.778 m)    GENERAL: The patient is a well-nourished male, in no acute distress. The vital signs are documented above. CARDIOVASCULAR: There is a regular rate and rhythm. PULMONARY: There is good air exchange bilaterally without wheezing or rales. EXTREMITIES: He has ecchymosis along the medial right thigh.  The swelling in the right leg has improved. He has some spider veins in the anterior right leg where he had the bleeding episode.  DATA:   DUPLEX: I have independently interpreted his duplex of the right lower extremity.  This shows no evidence of DVT.  The great saphenous vein is noncompressible from the saphenofemoral junction to the level of the knee.  MEDICAL ISSUES:   STATUS POST SUCCESSFUL ENDOVENOUS LASER  ABLATION OF THE RIGHT GREAT SAPHENOUS VEIN WITH STAB PHLEBECTOMIES: Patient is doing well status post endovenous laser ablation of the right great saphenous vein with stab phlebectomies.  He will continue wear his thigh-high compression stockings for another week.  I think he could then wear knee-high compression stockings when he is on his feet.  I think he would be a good candidate for sclerotherapy (1 unit) given his history of bleeding from the spider veins in the anterior right leg.  I will see him back as needed.  Deitra Mayo [facility] [phone]

## 2018-04-24 ENCOUNTER — Encounter (HOSPITAL_COMMUNITY): Payer: BLUE CROSS/BLUE SHIELD | Admitting: Cardiology

## 2018-04-29 ENCOUNTER — Other Ambulatory Visit (HOSPITAL_COMMUNITY): Payer: Self-pay | Admitting: Internal Medicine

## 2018-05-01 ENCOUNTER — Ambulatory Visit (HOSPITAL_COMMUNITY)
Admission: RE | Admit: 2018-05-01 | Discharge: 2018-05-01 | Disposition: A | Discharge status: Home / Self Care | Payer: BLUE CROSS/BLUE SHIELD | Source: Ambulatory Visit | Attending: Cardiology | Admitting: Cardiology

## 2018-05-01 VITALS — BP 118/64 | HR 67 | Wt 223.6 lb

## 2018-05-01 DIAGNOSIS — I712 Thoracic aortic aneurysm, without rupture: Secondary | ICD-10-CM | POA: Diagnosis not present

## 2018-05-01 DIAGNOSIS — Q231 Congenital insufficiency of aortic valve: Secondary | ICD-10-CM | POA: Diagnosis not present

## 2018-05-01 DIAGNOSIS — E785 Hyperlipidemia, unspecified: Secondary | ICD-10-CM | POA: Diagnosis not present

## 2018-05-01 DIAGNOSIS — Z8249 Family history of ischemic heart disease and other diseases of the circulatory system: Secondary | ICD-10-CM | POA: Diagnosis not present

## 2018-05-01 DIAGNOSIS — I25110 Atherosclerotic heart disease of native coronary artery with unstable angina pectoris: Secondary | ICD-10-CM

## 2018-05-01 DIAGNOSIS — Z951 Presence of aortocoronary bypass graft: Secondary | ICD-10-CM | POA: Insufficient documentation

## 2018-05-01 DIAGNOSIS — Z79899 Other long term (current) drug therapy: Secondary | ICD-10-CM | POA: Diagnosis not present

## 2018-05-01 DIAGNOSIS — I480 Paroxysmal atrial fibrillation: Secondary | ICD-10-CM | POA: Insufficient documentation

## 2018-05-01 DIAGNOSIS — I5022 Chronic systolic (congestive) heart failure: Secondary | ICD-10-CM | POA: Diagnosis present

## 2018-05-01 DIAGNOSIS — Z87891 Personal history of nicotine dependence: Secondary | ICD-10-CM | POA: Insufficient documentation

## 2018-05-01 DIAGNOSIS — Z7982 Long term (current) use of aspirin: Secondary | ICD-10-CM | POA: Diagnosis not present

## 2018-05-01 DIAGNOSIS — I255 Ischemic cardiomyopathy: Secondary | ICD-10-CM | POA: Diagnosis not present

## 2018-05-01 DIAGNOSIS — M791 Myalgia, unspecified site: Secondary | ICD-10-CM | POA: Diagnosis not present

## 2018-05-01 DIAGNOSIS — I2510 Atherosclerotic heart disease of native coronary artery without angina pectoris: Secondary | ICD-10-CM | POA: Diagnosis not present

## 2018-05-01 DIAGNOSIS — I252 Old myocardial infarction: Secondary | ICD-10-CM | POA: Diagnosis not present

## 2018-05-01 DIAGNOSIS — Z953 Presence of xenogenic heart valve: Secondary | ICD-10-CM | POA: Diagnosis not present

## 2018-05-01 DIAGNOSIS — R0989 Other specified symptoms and signs involving the circulatory and respiratory systems: Secondary | ICD-10-CM | POA: Insufficient documentation

## 2018-05-01 DIAGNOSIS — I110 Hypertensive heart disease with heart failure: Secondary | ICD-10-CM | POA: Insufficient documentation

## 2018-05-01 LAB — LIPID PANEL
CHOL/HDL RATIO: 6.7 ratio
CHOLESTEROL: 266 mg/dL — AB (ref 0–200)
HDL: 40 mg/dL — ABNORMAL LOW (ref >40)
LDL CALC: UNDETERMINED mg/dL (ref 0–99)
Triglycerides: 431 mg/dL — ABNORMAL HIGH (ref <150)
VLDL: UNDETERMINED mg/dL (ref 0–40)

## 2018-05-01 LAB — BASIC METABOLIC PANEL
Anion gap: 6 (ref 5–15)
BUN: 16 mg/dL (ref 6–20)
CHLORIDE: 106 mmol/L (ref 98–111)
CO2: 24 mmol/L (ref 22–32)
Calcium: 9.5 mg/dL (ref 8.9–10.3)
Creatinine, Ser: 0.94 mg/dL (ref 0.61–1.24)
GFR calc Af Amer: >60 mL/min (ref >60)
GFR calc non Af Amer: >60 mL/min (ref >60)
GLUCOSE: 97 mg/dL (ref 70–99)
Potassium: 4.1 mmol/L (ref 3.5–5.1)
Sodium: 136 mmol/L (ref 135–145)

## 2018-05-01 NOTE — Patient Instructions (Signed)
Labs done today  Your physician has requested that you have an echocardiogram. Echocardiography is a painless test that uses sound waves to create images of your heart. It provides your doctor with information about the size and shape of your heart and how well your heart's chambers and valves are working. This procedure takes approximately one hour. There are no restrictions for this procedure.  THIS WILL BE DONE AT [facility], THEY WILL CALL YOU TO SCHEDULE  Your physician has requested that you have a carotid duplex. This test is an ultrasound of the carotid arteries in your neck. It looks at blood flow through these arteries that supply the brain with blood. Allow one hour for this exam. There are no restrictions or special instructions.  THIS WILL BE DONE AT [facility] ON [address], THEY WILL CALL YOU TO SCHEDULE  We will contact you in 6 months to schedule your next appointment.

## 2018-05-01 NOTE — Progress Notes (Signed)
Patient ID: William Contreras, male   DOB: [birth year], 62 y.o.   MRN: [id number] PCP: Dr. Lin Landsman Cardiology: Dr. Aundra Dubin  62 y.o. with history of HTN was admitted in 8/16 with acute systolic CHF => volume overloaded, short of breath.  Troponin was mildly elevated to 0.04.  Echo showed EF 30-35%.  He was diuresed and had cardiac cath.  This showed diffuse severe disease but the LAD disease was relatively mild to CABG not recommended.  He had PCI with DES to mLCx and proximal to mid RCA.  Cardiac MRI/MRA chest showed EF 39%, bicuspid aortic valve with probable moderate aortic stenosis, 4.9 cm ascending aorta.  MRA in 12/17 showed ascending aorta dimension up to 5.1 cm.  He saw Dr. Servando Snare for aorta evaluation. Echo in 4/18 showed EF 45% with inferolateral hypokinesis, moderate AS with mean gradient 38 mmHg and AVA 1.3 cm^2, mild to moderate aortic insufficiency, bicuspid aortic valve, ascending aorta 5.1 cm.  I talked with Dr. Servando Snare and decided that it was time for AVR + ascending aorta replacement.  LHC in 8/18 showed progressive disease as described below in HPI.   Patient had bioprosthetic AVR, ascending aorta replacement, and CABG with LIMA-LAD and SVG-ramus in 9/18. He did not have any significant peri-operative complications.  He was noted to have a run of atrial fibrillation at cardiac rehab.  30 day monitor worn in 11/18 showed episodes of atrial fibrillation.  I recommended starting DOAC and stopping ASA but he refused.  He does not tolerate statins. He qualifies for Repatha but has not wanted to take it.   I next recommended Zetia but he refused this also.   He returns for followup of CAD and valvular heart disease.  He has been doing well symptomatically. No exertional dyspnea or chest pain. He works full time, but is going to lose his job at the end of the year.  No palpitations. No orthopnea/PND.    Labs (8/16): K 4.1, creatinine 1.0, HCT 42.6, LDL 200 Labs (9/16): K 4, creatinine 1.04, BNP  383 Labs (11/16): K 4.1, creatinine 0.99 Labs (12/16): LDL 152, HDL 37 (not taking Crestor) Labs (3/17): LDL 92, HDL 36, K 4.1, creatinine 0.92 Labs (6/17): LDL 83, HDL 38, K 3.8, creatinine 1.04 Labs (11/17): creatinine 0.94 Labs (9/18): K 4.4, creatinine 0.93 Labs (10/18): LDL 130 Labs (11/18): K 4.1, creatinine 0.9 Labs (2/19): K 4.1, creatinine 1.0  PMH: 1. HTN 2. CAD: NSTEMI/CHF in 8/16.  LHC (8/16) showed 40-50% mLAD, 50% prox ramus, 80% mLCx, total occlusion moderate OM2, 50% pRCA, 80% mRCA, 50% dRCA.  He had DES to prox and mid RCA and mid LCx.   - LHC (8/18): 50-60% mLAD, 40-50% dLM, severe diffuse disease in LCx involving moderate ramus and 3 relatively small OMs.   - 9/18 CABG with LIMA-LAD and SVG-ramus.  3. Chronic systolic CHF: Ischemic cardiomyopathy.   - Echo (8/16) with EF 30-35%, diffuse hypokinesis, mild LVH, ?bicuspid aortic valve with mild AS and mild AI, 5.1 cm aortic root.  - Cardiac MRI (12/16): EF 39%, mild LV dilation and moderate LVH, inferior/inferolateral/anterolateral hypokinesis, LGE in the inferior and inferolateral walls, normal RV size and systolic function, mild aortic insufficiency, bicuspid aortic valve with probable moderate aortic stenosis.  - Echo (4/17): EF 40-45%, inferolateral hypokinesis, bicuspid aortic valve with moderate aortic stenosis (mean gradient 33 mmHg), moderate AI, 4.5 cm aortic root. - Echo (4/18): EF 45% with inferolateral hypokinesis, moderate AS with mean gradient 38 mmHg and AVA 1.3  cm^2, mild to moderate aortic insufficiency, bicuspid aortic valve, ascending aorta 5.1 cm.  - Echo (11/18): EF 45-50%, mild LV dilation, bioprosthetic aortic valve with mean gradient 11 mmHg, mild mitral regurgitation.   4. Hyperlipidemia: Myalgias with all statins.  5. Bicuspid aortic valve: Moderate AS by 12/16 MRI, mild AS by 8/16 echo.  Moderate AS/moderate AI by 4/17 echo.  - Echo (4/18) with moderate AS and mild to moderate AI.  Mean AoV gradient  38 mmHg with AVA 1.3 cm^2.  - Bioprosthetic aortic valve in 9/18.  6. Ascending aortic aneurysm: 4.9 cm ascending aorta by MRA chest in 12/16.  Aortic root 4.5 cm on 4/17 echo.  - MRA chest 12/17 with 5.1 cm ascending aorta.  - Echo 4/18 with 5.1 cm ascending aorta.  - Ascending aorta replaced in 9/18.  7. Carotid dopplers (7/17) with no significant disease.  8. Atrial fibrillation: Paroxysmal.   - 30 day monitor (11/18) with runs of atrial fibrillation noted.   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  Lives in [place].   FH: No CAD that he knows of. +HTN.  No FH of aneurysm or bicuspid aortic valve.   ROS: All systems reviewed and negative except as per HPI.   Current Outpatient Medications  Medication Sig Dispense Refill  . acetaminophen (TYLENOL) 325 MG tablet Take 2 tablets (650 mg total) by mouth every 6 (six) hours as needed for mild pain.    Marland Kitchen aspirin EC 81 MG tablet Take 1 tablet (81 mg total) by mouth daily.    . calcium citrate-vitamin D (CITRACAL+D) 315-200 MG-UNIT tablet Take 1 tablet by mouth daily.    . carvedilol (COREG) 6.25 MG tablet TAKE 1 TABLET BY MOUTH TWICE DAILY 60 tablet 3  . Coenzyme Q10 (COQ10) 400 MG CAPS Take 400 mg by mouth 2 (two) times daily.    . Flax OIL Take 1 capsule by mouth daily.    . Lactobacillus (ULTIMATE PROBIOTIC FORMULA) CAPS Take 1 capsule by mouth daily.     Marland Kitchen lisinopril (PRINIVIL,ZESTRIL) 5 MG tablet TAKE 1 TABLET BY MOUTH TWICE DAILY (Patient taking differently: Take 5 mg by mouth daily. ) 60 tablet 6  . Magnesium 400 MG TABS Take 400 mg by mouth 2 (two) times daily.     . Omega-3 Fatty Acids (FISH OIL CONCENTRATE PO) Take 1 capsule by mouth daily.    Marland Kitchen oxymetazoline (AFRIN) 0.05 % nasal spray Place 1 spray into both nostrils at bedtime as needed for congestion.    Marland Kitchen spironolactone (ALDACTONE) 25 MG tablet Take 0.5 tablets (12.5 mg total) by mouth daily. 45 tablet 3   No current  facility-administered medications for this encounter.    BP 118/64   Pulse 67   Wt 101.4 kg (223 lb 9.6 oz)   SpO2 98%   BMI 32.08 kg/m  General: NAD Neck: No JVD, no thyromegaly or thyroid nodule.  Lungs: Clear to auscultation bilaterally with normal respiratory effort. CV: Nondisplaced PMI.  Heart regular S1/S2, no S3/S4, 2/6 early SEM RUSB.  No peripheral edema.  Left carotid bruit.  Normal pedal pulses.  Abdomen: Soft, nontender, no hepatosplenomegaly, no distention.  Skin: Intact without lesions or rashes.  Neurologic: Alert and oriented x 3.  Psych: Normal affect. Extremities: No clubbing or cyanosis.  HEENT: Normal.   Assessment/Plan: 1. CAD: S/p ACS 8/16 with DES to mLCx and proximal to mid RCA.  CABG with LIMA-LAD and SVG-ramus in 9/18. No ischemic  symptoms. - Continue ASA 81.  - Cannot tolerate statins.  - He refuses to take Repatha, Praluent, or Zetia.  He has had extensive atherosclerosis so I think this is a bad idea and told him so, but he is adamant about managing his lipids with diet only.  Hopefully he will have some protection for at least a number of years from his CABG.   2. Chronic systolic CHF: Ischemic cardiomyopathy.  EF 30-35% by echo in 2016.  Cardiac MRI showed EF up to 39% in 12/16.  Echo 4/17 showed EF improved to 40-45%.  Echo in 4/18 with EF 45%.  Post-op echo in 11/18 with EF 45-50%.  NYHA class I-II symptoms now, looks euvolemic.  - Continue current Coreg.  - Continue lisinopril 5 mg daily (had to decrease from bid due to orthostasis).  - Continue current spironolactone.  - BMET today.  - Repeat echo in 11/19 to make sure EF remains improved.  3. Bicuspid aortic valve: moderate AI, moderate AS by echo in 4/17.  Bioprosthetic AVR in 9/18.  Valve looked ok on post-op echo in 11/18. - Echo in 11/19 as above.  4. Hyperlipidemia: As above, cannot tolerate statins and refuses other agents despite my recommendation.   5. Ascending aortic aneurysm: 4.9 cm on  MRA chest in 12/16.  Likely related to bicuspid aortic valve.  4.5 cm aortic root on 4/17 echo.  MRA in 12/17 and echo in 4/18 showed increase in ascending aorta dimension to 5.1 cm.  In 9/18, had ascending aorta repair. Will followup with Dr. Servando Snare. 6. Atrial fibrillation: Paroxysmal.  First noted at cardiac rehab, later noted on 30 day monitor in 11/18.  Rhythm is regular today, suspect NSR.  CHADSVASC = 3.  With most recent guidelines, he should be able to take a DOAC (has bioprosthetic - not mechanical - aortic valve).  I have discussed stroke risk with him extensively and recommended that he replace ASA with a DOAC. He did not want to do this despite my recommendation.  7. Left carotid bruit: I will get carotid dopplers.   Followup in 6 months.    Loralie Champagne 05/01/2018

## 2018-05-03 ENCOUNTER — Encounter (HOSPITAL_COMMUNITY): Payer: Self-pay | Admitting: Cardiology

## 2018-05-03 ENCOUNTER — Telehealth (HOSPITAL_COMMUNITY): Payer: Self-pay | Admitting: Cardiology

## 2018-05-03 DIAGNOSIS — I25118 Atherosclerotic heart disease of native coronary artery with other forms of angina pectoris: Secondary | ICD-10-CM

## 2018-05-03 MED ORDER — ICOSAPENT ETHYL 1 G PO CAPS: 2.0000 g | ORAL_CAPSULE | Freq: Two times a day (BID) | ORAL | 3 refills | Status: DC

## 2018-05-03 NOTE — Telephone Encounter (Signed)
Notes recorded by Kerry Dory, CMA on 05/03/2018 at 11:21 AM EDT Patient aware. Patient voiced understanding. Repeat labs x 2 months, would like to review medication in detail, advised I would forward to CHF pharmacist so she could go over medication in detail   ------  Notes recorded by Kerry Dory, CMA on 05/03/2018 at 11:18 AM EDT Unable to reach patient. No answer unable to leave message   ------  Notes recorded by Larey Dresser, MD on 05/02/2018 at 8:54 PM EDT Very high triglycerides. Vascepa 2 g bid would be a good idea if he would agree to it. If so, repeat lipids in 2 months.

## 2018-05-03 NOTE — Telephone Encounter (Signed)
-----   Message from Larey Dresser, MD sent at 05/02/2018  8:54 PM EDT ----- Very high triglycerides.  Vascepa 2 g bid would be a good idea if he would agree to it.  If so, repeat lipids in 2 months.

## 2018-05-08 ENCOUNTER — Other Ambulatory Visit (HOSPITAL_COMMUNITY): Payer: Self-pay | Admitting: Pharmacist

## 2018-05-08 MED ORDER — OMEGA-3-ACID ETHYL ESTERS 1 G PO CAPS: 2.0000 g | ORAL_CAPSULE | Freq: Two times a day (BID) | ORAL | 5 refills | Status: DC

## 2018-05-14 ENCOUNTER — Other Ambulatory Visit (HOSPITAL_COMMUNITY): Payer: Self-pay | Admitting: Pharmacist

## 2018-05-14 MED ORDER — FENOFIBRATE 145 MG PO TABS: 145.0000 mg | ORAL_TABLET | Freq: Every day | ORAL | 5 refills | Status: DC

## 2018-05-17 ENCOUNTER — Other Ambulatory Visit (HOSPITAL_COMMUNITY): Payer: BLUE CROSS/BLUE SHIELD

## 2018-05-17 ENCOUNTER — Encounter (HOSPITAL_COMMUNITY): Payer: BLUE CROSS/BLUE SHIELD

## 2018-05-18 ENCOUNTER — Ambulatory Visit (HOSPITAL_BASED_OUTPATIENT_CLINIC_OR_DEPARTMENT_OTHER): Payer: BLUE CROSS/BLUE SHIELD

## 2018-05-18 ENCOUNTER — Ambulatory Visit (HOSPITAL_COMMUNITY)
Admission: RE | Admit: 2018-05-18 | Discharge: 2018-05-18 | Disposition: A | Discharge status: Home / Self Care | Payer: BLUE CROSS/BLUE SHIELD | Source: Ambulatory Visit | Attending: Cardiology | Admitting: Cardiology

## 2018-05-18 ENCOUNTER — Other Ambulatory Visit: Payer: Self-pay

## 2018-05-18 DIAGNOSIS — R0989 Other specified symptoms and signs involving the circulatory and respiratory systems: Secondary | ICD-10-CM | POA: Diagnosis present

## 2018-05-18 DIAGNOSIS — I5022 Chronic systolic (congestive) heart failure: Secondary | ICD-10-CM

## 2018-05-23 ENCOUNTER — Other Ambulatory Visit: Payer: Self-pay

## 2018-05-24 ENCOUNTER — Telehealth (HOSPITAL_COMMUNITY): Payer: Self-pay

## 2018-05-24 NOTE — Telephone Encounter (Signed)
Pt called no answer voice mail left for pt to call back for [place] and ECHO results

## 2018-05-29 ENCOUNTER — Ambulatory Visit (INDEPENDENT_AMBULATORY_CARE_PROVIDER_SITE_OTHER): Payer: BLUE CROSS/BLUE SHIELD | Admitting: *Deleted

## 2018-05-29 ENCOUNTER — Encounter: Payer: Self-pay | Admitting: *Deleted

## 2018-05-29 DIAGNOSIS — I83893 Varicose veins of bilateral lower extremities with other complications: Secondary | ICD-10-CM | POA: Diagnosis not present

## 2018-05-29 NOTE — Progress Notes (Signed)
X=.3% Sotradecol administered with a 27g butterfly.  Patient received a total of 8cc.  Treated all areas of concern. Easy access. Tol well. His stockings weren't very tight, so applied ace wraps over them. He will switch out after the 48 hours to his 20-30 iknee highs. Follow prn.  Photos: Yes.    Compression stockings applied: Yes.

## 2018-07-09 ENCOUNTER — Ambulatory Visit (HOSPITAL_COMMUNITY)
Admission: RE | Admit: 2018-07-09 | Discharge: 2018-07-09 | Disposition: A | Discharge status: Home / Self Care | Payer: BLUE CROSS/BLUE SHIELD | Source: Ambulatory Visit | Attending: Cardiology | Admitting: Cardiology

## 2018-07-09 DIAGNOSIS — I25118 Atherosclerotic heart disease of native coronary artery with other forms of angina pectoris: Secondary | ICD-10-CM | POA: Insufficient documentation

## 2018-07-09 LAB — LIPID PANEL
CHOLESTEROL: 277 mg/dL — AB (ref 0–200)
HDL: 42 mg/dL (ref >40)
LDL Cholesterol: 183 mg/dL — ABNORMAL HIGH (ref 0–99)
TRIGLYCERIDES: 260 mg/dL — AB (ref <150)
Total CHOL/HDL Ratio: 6.6 RATIO
VLDL: 52 mg/dL — ABNORMAL HIGH (ref 0–40)

## 2018-07-17 ENCOUNTER — Telehealth (HOSPITAL_COMMUNITY): Payer: Self-pay | Admitting: Licensed Clinical Social Worker

## 2018-07-17 NOTE — Telephone Encounter (Signed)
CSW consulted to follow up with patient regarding Repatha assistance.  CSW called and left message for pt last week and again today- have not had return call to discuss if pt wants to start Arnold, and if so, to discuss possible assistance options for payment.  Jorge Ny, LCSW Clinical Social Worker [facility] [phone]

## 2018-07-17 NOTE — Telephone Encounter (Signed)
Pt returned CSW call- pt is not interested in starting Repatha at this time and will try to make lifestyle adjustments instead.  Jorge Ny, LCSW Clinical Social Worker [facility] [phone]

## 2018-09-03 ENCOUNTER — Other Ambulatory Visit (HOSPITAL_COMMUNITY): Payer: Self-pay | Admitting: Internal Medicine

## 2018-10-08 ENCOUNTER — Other Ambulatory Visit: Payer: Self-pay | Admitting: Cardiothoracic Surgery

## 2018-10-08 DIAGNOSIS — Z952 Presence of prosthetic heart valve: Secondary | ICD-10-CM

## 2018-11-13 ENCOUNTER — Telehealth (HOSPITAL_COMMUNITY): Payer: Self-pay

## 2018-11-13 DIAGNOSIS — Z0279 Encounter for issue of other medical certificate: Secondary | ICD-10-CM | POA: Diagnosis not present

## 2018-11-13 NOTE — Telephone Encounter (Signed)
Received a request from [facility] for records to be faxed. Faxed records on 11/09/2018.

## 2018-11-22 ENCOUNTER — Ambulatory Visit: Payer: BLUE CROSS/BLUE SHIELD | Admitting: Cardiothoracic Surgery

## 2018-12-01 ENCOUNTER — Other Ambulatory Visit (HOSPITAL_COMMUNITY): Payer: Self-pay | Admitting: Internal Medicine

## 2018-12-11 ENCOUNTER — Other Ambulatory Visit: Payer: Self-pay

## 2018-12-11 ENCOUNTER — Ambulatory Visit
Admission: RE | Admit: 2018-12-11 | Discharge: 2018-12-11 | Disposition: A | Discharge status: Home / Self Care | Payer: BC Managed Care – PPO | Source: Ambulatory Visit | Attending: Cardiothoracic Surgery | Admitting: Cardiothoracic Surgery

## 2018-12-11 DIAGNOSIS — Z952 Presence of prosthetic heart valve: Secondary | ICD-10-CM

## 2018-12-11 MED ORDER — IOPAMIDOL (ISOVUE-370) INJECTION 76%
80.0000 mL | Freq: Once | INTRAVENOUS | Status: AC | PRN
  Administered 2018-12-11: 80 mL via INTRAVENOUS

## 2018-12-12 ENCOUNTER — Other Ambulatory Visit: Payer: BLUE CROSS/BLUE SHIELD

## 2018-12-12 ENCOUNTER — Other Ambulatory Visit: Payer: Self-pay

## 2018-12-13 ENCOUNTER — Ambulatory Visit: Payer: BLUE CROSS/BLUE SHIELD | Admitting: Cardiothoracic Surgery

## 2018-12-13 ENCOUNTER — Ambulatory Visit: Payer: BC Managed Care – PPO | Admitting: Cardiothoracic Surgery

## 2018-12-13 ENCOUNTER — Encounter: Payer: Self-pay | Admitting: Cardiothoracic Surgery

## 2018-12-13 ENCOUNTER — Other Ambulatory Visit: Payer: Self-pay | Admitting: *Deleted

## 2018-12-13 VITALS — BP 132/92 | HR 52 | Temp 97.7°F | Resp 16 | Ht 70.0 in | Wt 225.0 lb

## 2018-12-13 DIAGNOSIS — Z952 Presence of prosthetic heart valve: Secondary | ICD-10-CM | POA: Diagnosis not present

## 2018-12-13 DIAGNOSIS — Z9889 Other specified postprocedural states: Secondary | ICD-10-CM | POA: Diagnosis not present

## 2018-12-13 DIAGNOSIS — R1907 Generalized intra-abdominal and pelvic swelling, mass and lump: Secondary | ICD-10-CM

## 2018-12-13 DIAGNOSIS — Z8679 Personal history of other diseases of the circulatory system: Secondary | ICD-10-CM | POA: Diagnosis not present

## 2018-12-13 DIAGNOSIS — R599 Enlarged lymph nodes, unspecified: Secondary | ICD-10-CM

## 2018-12-13 DIAGNOSIS — R591 Generalized enlarged lymph nodes: Secondary | ICD-10-CM

## 2018-12-13 NOTE — Progress Notes (Signed)
[address]       [place] [address]             [phone]      William Contreras [facility] Medical Record #[id number] Date of Birth: 08-Sep-1955  Referring: Angelina Sheriff, MD Primary Care: Angelina Sheriff, MD  Chief Complaint:   POST OP FOLLOW UP 03/14/2017   OPERATIVE REPORT PREOPERATIVE DIAGNOSIS:  Aortic stenosis with probable bicuspid aortic valve, dilated ascending aorta, and coronary artery disease. POSTOPERATIVE DIAGNOSIS:  Aortic stenosis with probable bicuspid aortic valve, dilated ascending aorta, and coronary artery disease. SURGICAL PROCEDURE:  Aortic valve replacement with pericardial tissue valve, [employer], 25 mm, model 3300TFX, serial [id number]; replacement of ascending aorta with a 32-mm Hemashield woven Dacron graft; coronary artery bypass grafting x2 with the left internal mammary to the left anterior descending and reverse saphenous vein graft to the intermediate coronary artery with left thigh endovascular vein harvesting of the left greater saphenous vein.  History of Present Illness:     Patient returns to the office today 6 months following replacement of his ascending aorta and aortic valve in 2018.  From a cardiac standpoint the patient has been doing well without signs or symptoms of congestive heart failure.  He does note that his job was recently eliminated with consolidation of his company.  He has applied for disability.  Since last seen he denies any change in weight night sweats fevers or chills.    Past Medical History:  Diagnosis Date  . CHF (congestive heart failure) ([place])   . Coronary artery disease   . Essential hypertension   . GERD (gastroesophageal reflux disease)   . Heart murmur   . Morbid obesity ([place])   . Neuromuscular disorder ([place])    burning in both feet - pt. thinks its related to statin   . Sleep apnea    told that he was borderline, but he has lost 50 lbs & is feeling much  better      Social History   Tobacco Use  Smoking Status Former Smoker  . Packs/day: 1.00  . Years: 15.00  . Pack years: 15.00  . Types: Cigarettes  Smokeless Tobacco Never Used  Tobacco Comment   quit smoking ~ 20 yrs ago.    Social History   Substance and Sexual Activity  Alcohol Use No  . Alcohol/week: 0.0 standard drinks   Comment: prev drank - none in ~ 25 yrs.     Allergies  Allergen Reactions  . Lipitor [Atorvastatin] Other (See Comments)    Joint and head pains  . Penicillins Other (See Comments)    Lightheadness Has patient had a PCN reaction causing immediate rash, facial/tongue/throat swelling, SOB or lightheadedness with hypotension:unknown Has patient had a PCN reaction causing severe rash involving mucus membranes or skin necrosis: No Has patient had a PCN reaction that required hospitalization: No Has patient had a PCN reaction occurring within the last 10 years: No If all of the above answers are "NO", then may proceed with Cephalosporin use. Childhood allergy rec'd Amoxicillin with     Current Outpatient Medications  Medication Sig Dispense Refill  . acetaminophen (TYLENOL) 325 MG tablet Take 2 tablets (650 mg total) by mouth every 6 (six) hours as needed for mild pain.    Marland Kitchen aspirin EC 81 MG tablet Take 1 tablet (81 mg total) by mouth daily.    . calcium citrate-vitamin D (CITRACAL+D) 315-200 MG-UNIT tablet  Take 1 tablet by mouth daily.    . carvedilol (COREG) 6.25 MG tablet TAKE 1 TABLET BY MOUTH TWICE DAILY 60 tablet 3  . Coenzyme Q10 (COQ10) 400 MG CAPS Take 400 mg by mouth 2 (two) times daily.    . Flax OIL Take 1 capsule by mouth daily.    . Lactobacillus (ULTIMATE PROBIOTIC FORMULA) CAPS Take 1 capsule by mouth daily.     Marland Kitchen lisinopril (PRINIVIL,ZESTRIL) 5 MG tablet TAKE 1 TABLET BY MOUTH TWICE DAILY (Patient taking differently: Take 5 mg by mouth daily. ) 60 tablet 6  . Magnesium 400 MG TABS Take 400 mg by mouth 2 (two) times daily.     .  Omega-3 Fatty Acids (FISH OIL CONCENTRATE PO) Take 1 capsule by mouth daily.    Marland Kitchen oxymetazoline (AFRIN) 0.05 % nasal spray Place 1 spray into both nostrils at bedtime as needed for congestion.    Marland Kitchen spironolactone (ALDACTONE) 25 MG tablet Take 1/2 (one-half) tablet by mouth once daily 45 tablet 1   No current facility-administered medications for this visit.        Physical Exam: BP (!) 132/92 (BP Location: Right Arm, Patient Position: Sitting, Cuff Size: Normal)   Pulse (!) 52   Temp 97.7 F (36.5 C) (Skin)   Resp 16   Ht 5\' 10"  (1.778 m)   Wt 225 lb (102.1 kg)   SpO2 95% Comment: EA  BMI 32.28 kg/m  General appearance: alert, cooperative, appears stated age and no distress Head: Normocephalic, without obvious abnormality, atraumatic Neck: no adenopathy, no carotid bruit, no JVD, supple, symmetrical, trachea midline and thyroid not enlarged, symmetric, no tenderness/mass/nodules Lymph nodes: Cervical, supraclavicular, and axillary nodes normal. Resp: clear to auscultation bilaterally Back: symmetric, no curvature. ROM normal. No CVA tenderness. Cardio: regular rate and rhythm, S1, S2 normal, no murmur, click, rub or gallop GI: soft, non-tender; bowel sounds normal; no masses,  no organomegaly Extremities: extremities normal, atraumatic, no cyanosis or edema and Homans sign is negative, no sign of DVT Neurologic: Grossly normal Sternotomy incision is well-healed Specific attention to lymphadenopathy was made on exam as noted above there is no palpable cervical supraclavicular axillary or inguinal adenopathy appreciated  Diagnostic Studies & Laboratory data:     Recent Radiology Findings:   Ct Angio Chest Aorta W/cm &/or Wo/cm  Result Date: 12/11/2018 CLINICAL DATA:  Status post aortic valve replacement. EXAM: CT ANGIOGRAPHY CHEST WITH CONTRAST TECHNIQUE: Multidetector CT imaging of the chest was performed using the standard protocol during bolus administration of intravenous  contrast. Multiplanar CT image reconstructions and MIPs were obtained to evaluate the vascular anatomy. CONTRAST:  75 mL ISOVUE-370 IOPAMIDOL (ISOVUE-370) INJECTION 76% COMPARISON:  CT scan of Nov 16, 2017. FINDINGS: Cardiovascular: Status post aortic valve replacement. Status post surgical repair of ascending thoracic aorta. Status post coronary artery bypass graft. No aneurysm or dissection is noted. Great vessels are widely patent without significant stenosis. Normal cardiac size. No pericardial effusion. Mediastinum/Nodes: No enlarged mediastinal, hilar, or axillary lymph nodes. Thyroid gland, trachea, and esophagus demonstrate no significant findings. Lungs/Pleura: Lungs are clear. No pleural effusion or pneumothorax. Upper Abdomen: There is noted periaortic adenopathy which appears to be increased in size compared to prior exam. The largest lymph node measures 2.9 cm. Musculoskeletal: No chest wall abnormality. No acute or significant osseous findings. Review of the MIP images confirms the above findings. IMPRESSION: Patient is status post aortic valve replacement with surgical repair of ascending thoracic aorta and coronary artery bypass graft. These  are stable findings with no evidence of dissection or aneurysm. However, there appears to be interval development of significantly enlarged periaortic adenopathy in the visualized portion of the upper abdomen. This is concerning for possible lymphoma or other malignancy. CT scan of the abdomen and pelvis with intravenous contrast is recommended for further evaluation. These results will be called to the ordering clinician or representative by the Radiologist Assistant, and communication documented in the PACS or zVision Dashboard. Electronically Signed   By: Marijo Conception M.D.   On: 12/11/2018 15:00  I have independently reviewed the above radiology studies  and reviewed the findings with the patient.     Recent Lab Findings: Lab Results  Component Value  Date   WBC 7.5 05/11/2017   HGB 14.2 05/11/2017   HCT 41.3 05/11/2017   PLT 273 05/11/2017   GLUCOSE 97 05/01/2018   CHOL 277 (H) 07/09/2018   TRIG 260 (H) 07/09/2018   HDL 42 07/09/2018   LDLCALC 183 (H) 07/09/2018   ALT 20 03/09/2017   AST 21 03/09/2017   NA 136 05/01/2018   K 4.1 05/01/2018   CL 106 05/01/2018   CREATININE 0.94 05/01/2018   BUN 16 05/01/2018   CO2 24 05/01/2018   TSH 0.402 03/15/2017   INR 1.38 03/14/2017   HGBA1C 5.1 03/09/2017      Assessment / Plan: Status post aortic valve replacement and ascending aortic replacement, stable on CTA of the chest today Echocardiogram a month postop shows improved LV function 45 to 50% ejection fraction Patient has no symptoms of congestive heart failure, syncope or angina. I again reviewed with he and his wife the need for antibiotic prophylaxis with an artificial tissue valve anytime he has invasive procedures including dental work.  The patient's CT scan is reviewed with him incidental finding of significant periaortic adenopathy in the upper abdomen.  The patient scans were reviewed at the multidisciplinary thoracic oncology conference.  The recommendation was to proceed with a PET scan to fully evaluate retroperitoneal adenopathy and also to localize the most likely area for biopsy.   We will plan to get the PET scan as soon as possible and then proceed with oncology follow-up as appropriate following this.  We will plan to see the patient back in 1 year for cardiac surgery follow-up after his aneurysm replacement and aortic valve replacement.   Grace Isaac MD      [address] [place] [address] Office [phone]   Beeper [phone]  12/13/2018 9:40 AM

## 2018-12-14 DIAGNOSIS — Z736 Limitation of activities due to disability: Secondary | ICD-10-CM

## 2018-12-19 ENCOUNTER — Encounter: Payer: Self-pay | Admitting: *Deleted

## 2018-12-19 ENCOUNTER — Other Ambulatory Visit: Payer: Self-pay

## 2018-12-19 ENCOUNTER — Encounter (HOSPITAL_COMMUNITY)
Admission: RE | Admit: 2018-12-19 | Discharge: 2018-12-19 | Disposition: A | Discharge status: Home / Self Care | Payer: BC Managed Care – PPO | Source: Ambulatory Visit | Attending: Cardiothoracic Surgery | Admitting: Cardiothoracic Surgery

## 2018-12-19 DIAGNOSIS — R1907 Generalized intra-abdominal and pelvic swelling, mass and lump: Secondary | ICD-10-CM | POA: Diagnosis not present

## 2018-12-19 DIAGNOSIS — R590 Localized enlarged lymph nodes: Secondary | ICD-10-CM

## 2018-12-19 LAB — GLUCOSE, CAPILLARY: Glucose-Capillary: 104 mg/dL — ABNORMAL HIGH (ref 70–99)

## 2018-12-19 MED ORDER — FLUDEOXYGLUCOSE F - 18 (FDG) INJECTION
11.1500 | Freq: Once | INTRAVENOUS | Status: AC | PRN
  Administered 2018-12-19: 11.15 via INTRAVENOUS

## 2018-12-19 NOTE — Progress Notes (Signed)
Oncology Nurse Navigator Documentation  Oncology Nurse Navigator Flowsheets 12/19/2018  Navigator Location [facility]  Referral date to RadOnc/MedOnc 12/19/2018  Navigator Encounter Type Other/per Dr. Servando Snare and Dr. Julien Nordmann, patient needs a referral to Dr. Irene Limbo.  Referral completed.  I will update new patient coordinator to have them get him scheduled.   Treatment Phase Pre-Tx/Tx Discussion  Barriers/Navigation Needs Coordination of Care  Interventions Coordination of Care  Coordination of Care Other  Acuity Level 2  Time Spent with Patient 30

## 2018-12-20 ENCOUNTER — Other Ambulatory Visit: Payer: Self-pay | Admitting: *Deleted

## 2018-12-20 ENCOUNTER — Telehealth: Payer: Self-pay | Admitting: Hematology

## 2018-12-20 ENCOUNTER — Telehealth: Payer: Self-pay | Admitting: Cardiothoracic Surgery

## 2018-12-20 DIAGNOSIS — R918 Other nonspecific abnormal finding of lung field: Secondary | ICD-10-CM

## 2018-12-20 NOTE — Telephone Encounter (Signed)
      [address]       [place] [address]             [phone]    Called and discussed with the patient the findings of the PET scan done yesterday.  Also reviewed with him the discussion at the multidisciplinary thoracic oncology conference this morning on the review of his films.  Consensus was to proceed with CT directed needle biopsy of the left periaortic abdominal hypermetabolic lymph nodes and sent for evaluation for lymphoproliferative disorder.  This was relayed to the patient he was agreeable.  At the suggestion of oncology an appointment will be made for him with Dr. Irene Limbo.

## 2018-12-20 NOTE — Telephone Encounter (Signed)
Received a new patient referral from Dr. Servando Snare for Mediastinal adenopathy. Pt has been cld and scheduled to see Dr. Irene Limbo on 6/16 at 11am. William Contreras has been made aware to arrive 20 minutes early. Provided him with the location to our facility.

## 2018-12-24 ENCOUNTER — Other Ambulatory Visit: Payer: Self-pay | Admitting: Student

## 2018-12-24 ENCOUNTER — Telehealth: Payer: Self-pay | Admitting: Hematology

## 2018-12-24 NOTE — Telephone Encounter (Signed)
Pt cld to r/s appt w/Dr. Irene Limbo d/t bx scheduled on 6/16. New appt has been made for 6/23 at 11am.

## 2018-12-25 ENCOUNTER — Ambulatory Visit (HOSPITAL_COMMUNITY)
Admission: RE | Admit: 2018-12-25 | Discharge: 2018-12-25 | Disposition: A | Discharge status: Home / Self Care | Payer: BC Managed Care – PPO | Source: Ambulatory Visit | Attending: Cardiothoracic Surgery | Admitting: Cardiothoracic Surgery

## 2018-12-25 ENCOUNTER — Other Ambulatory Visit: Payer: BC Managed Care – PPO

## 2018-12-25 ENCOUNTER — Other Ambulatory Visit: Payer: Self-pay

## 2018-12-25 ENCOUNTER — Ambulatory Visit: Payer: BC Managed Care – PPO | Admitting: Hematology

## 2018-12-25 ENCOUNTER — Encounter (HOSPITAL_COMMUNITY): Payer: Self-pay

## 2018-12-25 DIAGNOSIS — Z955 Presence of coronary angioplasty implant and graft: Secondary | ICD-10-CM | POA: Diagnosis not present

## 2018-12-25 DIAGNOSIS — Z87891 Personal history of nicotine dependence: Secondary | ICD-10-CM | POA: Insufficient documentation

## 2018-12-25 DIAGNOSIS — I447 Left bundle-branch block, unspecified: Secondary | ICD-10-CM | POA: Insufficient documentation

## 2018-12-25 DIAGNOSIS — I491 Atrial premature depolarization: Secondary | ICD-10-CM | POA: Diagnosis not present

## 2018-12-25 DIAGNOSIS — Z880 Allergy status to penicillin: Secondary | ICD-10-CM | POA: Diagnosis not present

## 2018-12-25 DIAGNOSIS — Z951 Presence of aortocoronary bypass graft: Secondary | ICD-10-CM | POA: Diagnosis not present

## 2018-12-25 DIAGNOSIS — Z79899 Other long term (current) drug therapy: Secondary | ICD-10-CM | POA: Diagnosis not present

## 2018-12-25 DIAGNOSIS — I25810 Atherosclerosis of coronary artery bypass graft(s) without angina pectoris: Secondary | ICD-10-CM | POA: Diagnosis not present

## 2018-12-25 DIAGNOSIS — Z8249 Family history of ischemic heart disease and other diseases of the circulatory system: Secondary | ICD-10-CM | POA: Diagnosis not present

## 2018-12-25 DIAGNOSIS — G4730 Sleep apnea, unspecified: Secondary | ICD-10-CM | POA: Diagnosis not present

## 2018-12-25 DIAGNOSIS — R918 Other nonspecific abnormal finding of lung field: Secondary | ICD-10-CM

## 2018-12-25 DIAGNOSIS — R9431 Abnormal electrocardiogram [ECG] [EKG]: Secondary | ICD-10-CM | POA: Insufficient documentation

## 2018-12-25 DIAGNOSIS — I110 Hypertensive heart disease with heart failure: Secondary | ICD-10-CM | POA: Diagnosis not present

## 2018-12-25 DIAGNOSIS — R591 Generalized enlarged lymph nodes: Secondary | ICD-10-CM | POA: Insufficient documentation

## 2018-12-25 DIAGNOSIS — I509 Heart failure, unspecified: Secondary | ICD-10-CM | POA: Diagnosis not present

## 2018-12-25 DIAGNOSIS — Z7982 Long term (current) use of aspirin: Secondary | ICD-10-CM | POA: Diagnosis not present

## 2018-12-25 DIAGNOSIS — Z953 Presence of xenogenic heart valve: Secondary | ICD-10-CM | POA: Diagnosis not present

## 2018-12-25 LAB — CBC
HCT: 42.2 % (ref 39.0–52.0)
Hemoglobin: 14.9 g/dL (ref 13.0–17.0)
MCH: 32.5 pg (ref 26.0–34.0)
MCHC: 35.3 g/dL (ref 30.0–36.0)
MCV: 91.9 fL (ref 80.0–100.0)
Platelets: 220 10*3/uL (ref 150–400)
RBC: 4.59 MIL/uL (ref 4.22–5.81)
RDW: 11.5 % (ref 11.5–15.5)
WBC: 5.3 10*3/uL (ref 4.0–10.5)
nRBC: 0 % (ref 0.0–0.2)

## 2018-12-25 LAB — PROTIME-INR
INR: 1.1 (ref 0.8–1.2)
Prothrombin Time: 13.9 seconds (ref 11.4–15.2)

## 2018-12-25 MED ORDER — HYDROCODONE-ACETAMINOPHEN 5-325 MG PO TABS
1.0000 | ORAL_TABLET | ORAL | Status: DC | PRN
  Filled 2018-12-25: qty 2

## 2018-12-25 MED ORDER — SODIUM CHLORIDE 0.9 % IV SOLN: INTRAVENOUS | Status: DC

## 2018-12-25 MED ORDER — FENTANYL CITRATE (PF) 100 MCG/2ML IJ SOLN
INTRAMUSCULAR | Status: AC
  Filled 2018-12-25: qty 4

## 2018-12-25 MED ORDER — MIDAZOLAM HCL 2 MG/2ML IJ SOLN
INTRAMUSCULAR | Status: AC | PRN
  Administered 2018-12-25 (×2): 1 mg via INTRAVENOUS

## 2018-12-25 MED ORDER — MIDAZOLAM HCL 2 MG/2ML IJ SOLN
INTRAMUSCULAR | Status: AC
  Filled 2018-12-25: qty 4

## 2018-12-25 MED ORDER — FENTANYL CITRATE (PF) 100 MCG/2ML IJ SOLN
INTRAMUSCULAR | Status: AC | PRN
  Administered 2018-12-25: 25 ug via INTRAVENOUS
  Administered 2018-12-25: 50 ug via INTRAVENOUS

## 2018-12-25 NOTE — Discharge Instructions (Signed)
Needle Biopsy, Care After °These instructions tell you how to care for yourself after your procedure. Your doctor may also give you more specific instructions. Call your doctor if you have any problems or questions. °What can I expect after the procedure? °After the procedure, it is common to have: °· Soreness. °· Bruising. °· Mild pain. °Follow these instructions at home: ° °· Return to your normal activities as told by your doctor. Ask your doctor what activities are safe for you. °· Take over-the-counter and prescription medicines only as told by your doctor. °· Wash your hands with soap and water before you change your bandage (dressing). If you cannot use soap and water, use hand sanitizer. °· Follow instructions from your doctor about: °? How to take care of your puncture site. °? When and how to change your bandage. °? When to remove your bandage. °· Check your puncture site every day for signs of infection. Watch for: °? Redness, swelling, or pain. °? Fluid or blood.  °? Pus or a bad smell. °? Warmth. °· Do not take baths, swim, or use a hot tub until your doctor approves. Ask your doctor if you may take showers. You may only be allowed to take sponge baths. °· Keep all follow-up visits as told by your doctor. This is important. °Contact a doctor if you have: °· A fever. °· Redness, swelling, or pain at the puncture site, and it lasts longer than a few days. °· Fluid, blood, or pus coming from the puncture site. °· Warmth coming from the puncture site. °Get help right away if: °· You have a lot of bleeding from the puncture site. °Summary °· After the procedure, it is common to have soreness, bruising, or mild pain at the puncture site. °· Check your puncture site every day for signs of infection, such as redness, swelling, or pain. °· Get help right away if you have severe bleeding from your puncture site. °This information is not intended to replace advice given to you by your health care provider. Make  sure you discuss any questions you have with your health care provider. °Document Released: 06/09/2008 Document Revised: 07/10/2017 Document Reviewed: 07/10/2017 °Elsevier Interactive Patient Education © 2019 [employer] °Moderate Conscious Sedation, Adult, Care After °These instructions provide you with information about caring for yourself after your procedure. Your health care provider may also give you more specific instructions. Your treatment has been planned according to current medical practices, but problems sometimes occur. Call your health care provider if you have any problems or questions after your procedure. °What can I expect after the procedure? °After your procedure, it is common: °· To feel sleepy for several hours. °· To feel clumsy and have poor balance for several hours. °· To have poor judgment for several hours. °· To vomit if you eat too soon. °Follow these instructions at home: °For at least 24 hours after the procedure: ° °· Do not: °? Participate in activities where you could fall or become injured. °? Drive. °? Use heavy machinery. °? Drink alcohol. °? Take sleeping pills or medicines that cause drowsiness. °? Make important decisions or sign legal documents. °? Take care of children on your own. °· Rest. °Eating and drinking °· Follow the diet recommended by your health care provider. °· If you vomit: °? Drink water, juice, or soup when you can drink without vomiting. °? Make sure you have little or no nausea before eating solid foods. °General instructions °· Have a responsible adult stay   with you until you are awake and alert. °· Take over-the-counter and prescription medicines only as told by your health care provider. °· If you smoke, do not smoke without supervision. °· Keep all follow-up visits as told by your health care provider. This is important. °Contact a health care provider if: °· You keep feeling nauseous or you keep vomiting. °· You feel light-headed. °· You develop a  rash. °· You have a fever. °Get help right away if: °· You have trouble breathing. °This information is not intended to replace advice given to you by your health care provider. Make sure you discuss any questions you have with your health care provider. °Document Released: 04/17/2013 Document Revised: 11/30/2015 Document Reviewed: 10/17/2015 °Elsevier Interactive Patient Education © 2019 [employer] ° °

## 2018-12-25 NOTE — Procedures (Signed)
Interventional Radiology Procedure:   Indications: Lymphadenopathy  Procedure: CT guided retroperitoneal lymph node biopsy  Findings: Cores from left periaortic lymph node   Complications: None     EBL: less than 10 ml  Plan: Bedrest 2 hours.    Saqib Cazarez R. Anselm Pancoast, MD  Pager: [phone]

## 2018-12-25 NOTE — H&P (Signed)
Chief Complaint: Periaortic lymphadenopathy  Referring Physician(s): Grace Isaac  Supervising Physician: Markus Daft  Patient Status: [facility] - Out-pt  History of Present Illness: William Contreras is a 63 y.o. male who recently underwent Aortic Valve surgery and CABG x 2 vessels.  CT scan showed incidental finding of periaortic lymphadenopathy.  PET showed Hypermetabolic adenopathy within the chest, abdomen, and upper pelvis, most consistent with a lymphoproliferative process.  William Contreras is here today biopsy.   Past Medical History:  Diagnosis Date  . CHF (congestive heart failure) ([place])   . Coronary artery disease   . Essential hypertension   . GERD (gastroesophageal reflux disease)   . Heart murmur   . Morbid obesity ([place])   . Neuromuscular disorder ([place])    burning in both feet - pt. thinks its related to statin   . Sleep apnea    told that William Contreras was borderline, but William Contreras has lost 50 lbs & is feeling much better     Past Surgical History:  Procedure Laterality Date  . AORTIC VALVE REPLACEMENT N/A 03/14/2017   Procedure: AORTIC VALVE REPLACEMENT (AVR);  Surgeon: Grace Isaac, MD;  Location: [place];  Service: Open Heart Surgery;  Laterality: N/A;  Using 79mm Edwards Magna Ease Bioprosthetic Aortic Valve  . CARDIAC CATHETERIZATION N/A 03/10/2015   Procedure: Right/Left Heart Cath and Coronary Angiography;  Surgeon: Larey Dresser, MD;  Location: [place] CV LAB;  Service: Cardiovascular;  Laterality: N/A;  . CARDIAC CATHETERIZATION N/A 03/10/2015   Procedure: Coronary Stent Intervention;  Surgeon: Leonie Man, MD;  Location: [place] [facility];  Service: Cardiovascular;  Laterality: N/A;  . CORONARY ARTERY BYPASS GRAFT N/A 03/14/2017   Procedure: CORONARY ARTERY BYPASS GRAFTING (CABG), ON PUMP, TIMES TWO, USING LEFT INTERNAL MAMMARY ARTERY AND ENDOSCOPICALLY HARVESTED LEFT GREATER SAPHENOUS VEIN;  Surgeon: Grace Isaac, MD;  Location: [place];  Service: Open Heart  Surgery;  Laterality: N/A;  LIMA to LAD SVG to RAMUS INTERMEDIATE  . REPLACEMENT ASCENDING AORTA N/A 03/14/2017   Procedure: REPLACEMENT ASCENDING AORTA WITH WHEAT PROCEDURE;  Surgeon: Grace Isaac, MD;  Location: [place];  Service: Open Heart Surgery;  Laterality: N/A;  Using 39mm Hemashield Platinum Woven Double Velour Vascular Graft  . TEE WITHOUT CARDIOVERSION N/A 03/14/2017   Procedure: TRANSESOPHAGEAL ECHOCARDIOGRAM (TEE);  Surgeon: Grace Isaac, MD;  Location: [place];  Service: Open Heart Surgery;  Laterality: N/A;  . TONSILLECTOMY      Allergies: Lipitor [atorvastatin] and Penicillins  Medications: Prior to Admission medications   Medication Sig Start Date End Date Taking? Authorizing Provider  acetaminophen (TYLENOL) 325 MG tablet Take 2 tablets (650 mg total) by mouth every 6 (six) hours as needed for mild pain. 03/18/17   Barrett, Lodema Hong, PA-C  aspirin EC 81 MG tablet Take 1 tablet (81 mg total) by mouth daily. 04/20/17   Grace Isaac, MD  calcium citrate-vitamin D (CITRACAL+D) 315-200 MG-UNIT tablet Take 1 tablet by mouth daily.    [provider]  carvedilol (COREG) 6.25 MG tablet TAKE 1 TABLET BY MOUTH TWICE DAILY 09/03/18   Bensimhon, Shaune Pascal, MD  Coenzyme Q10 (COQ10) 400 MG CAPS Take 400 mg by mouth 2 (two) times daily.    [provider]  Flax OIL Take 1 capsule by mouth daily.    [provider]  Lactobacillus (ULTIMATE PROBIOTIC FORMULA) CAPS Take 1 capsule by mouth daily.     [provider]  lisinopril (PRINIVIL,ZESTRIL) 5 MG  tablet TAKE 1 TABLET BY MOUTH TWICE DAILY Patient taking differently: Take 5 mg by mouth daily.  01/03/18   Larey Dresser, MD  Magnesium 400 MG TABS Take 400 mg by mouth 2 (two) times daily.     [provider]  Omega-3 Fatty Acids (FISH OIL CONCENTRATE PO) Take 1 capsule by mouth daily.    [provider]  oxymetazoline (AFRIN) 0.05 % nasal spray Place 1 spray into both nostrils at  bedtime as needed for congestion.    [provider]  spironolactone (ALDACTONE) 25 MG tablet Take 1/2 (one-half) tablet by mouth once daily 12/04/18   Bensimhon, Shaune Pascal, MD     Family History  Problem Relation Age of Onset  . Other Father        died young in [place].  Marland Kitchen Heart attack Mother        alive @ 51.  . Other Brother        A & W  . Other Sister        A & W    Social History   Socioeconomic History  . Marital status: Married    Spouse name: Not on file  . Number of children: Not on file  . Years of education: Not on file  . Highest education level: Not on file  Occupational History  . Not on file  Social Needs  . Financial resource strain: Not on file  . Food insecurity    Worry: Not on file    Inability: Not on file  . Transportation needs    Medical: Not on file    Non-medical: Not on file  Tobacco Use  . Smoking status: Former Smoker    Packs/day: 1.00    Years: 15.00    Pack years: 15.00    Types: Cigarettes  . Smokeless tobacco: Never Used  . Tobacco comment: quit smoking ~ 20 yrs ago.  Substance and Sexual Activity  . Alcohol use: No    Alcohol/week: 0.0 standard drinks    Comment: prev drank - none in ~ 25 yrs.  . Drug use: No    Comment: prev smoked MJ, none in 25 yrs.  . Sexual activity: Yes  Lifestyle  . Physical activity    Days per week: Not on file    Minutes per session: Not on file  . Stress: Not on file  Relationships  . Social Herbalist on phone: Not on file    Gets together: Not on file    Attends religious service: Not on file    Active member of club or organization: Not on file    Attends meetings of clubs or organizations: Not on file    Relationship status: Not on file  Other Topics Concern  . Not on file  Social History Narrative   Pt lives in [place] with his wife and son.  William Contreras has 2 step children and another child - all grown and out of the house.     Review of Systems: A 12 point ROS  discussed and pertinent positives are indicated in the HPI above.  All other systems are negative.  Review of Systems  Vital Signs: BP (!) 160/104   Pulse 61   Temp 97.9 F (36.6 C) (Oral)   Resp 16   SpO2 99%   Physical Exam Vitals signs reviewed.  Constitutional:      Appearance: Normal appearance.  HENT:     Head: Normocephalic and  atraumatic.  Eyes:     Extraocular Movements: Extraocular movements intact.  Neck:     Musculoskeletal: Normal range of motion.  Cardiovascular:     Rate and Rhythm: Normal rate and regular rhythm.  Pulmonary:     Effort: Pulmonary effort is normal.     Breath sounds: Normal breath sounds.  Abdominal:     Palpations: Abdomen is soft.  Musculoskeletal: Normal range of motion.  Skin:    General: Skin is warm and dry.  Neurological:     General: No focal deficit present.     Mental Status: William Contreras is alert and oriented to person, place, and time.  Psychiatric:        Mood and Affect: Mood normal.        Behavior: Behavior normal.        Thought Content: Thought content normal.        Judgment: Judgment normal.     Imaging: Nm Pet Image Initial (pi) Skull Base To Thigh  Result Date: 12/19/2018 CLINICAL DATA:  Initial treatment strategy for abdominal adenopathy. Intraabdominal/pelvic swelling, mass. EXAM: NUCLEAR MEDICINE PET SKULL BASE TO THIGH TECHNIQUE: 11.2 mCi F-18 FDG was injected intravenously. Full-ring PET imaging was performed from the skull base to thigh after the radiotracer. CT data was obtained and used for attenuation correction and anatomic localization. Fasting blood glucose: 104 mg/dl COMPARISON:  Chest CT of 12/11/2018. FINDINGS: Mediastinal blood pool activity: SUV max 2.3 Liver activity: SUV max 4.4 NECK: No areas of abnormal hypermetabolism. Incidental CT findings: No cervical adenopathy. CHEST: High right paratracheal node measures 11 mm and a S.U.V. max of 6.3 on image 61/4. A left paratracheal/middle mediastinal node measures  1.0 cm and a S.U.V. max of 6.0 on image 75/4. Incidental CT findings: Deferred to recent diagnostic CT. Aortic valve repair. Median sternotomy. Cardiomegaly. ABDOMEN/PELVIS: A preaortic node measures 2.9 cm and a S.U.V. max of 27.5 on image 137/4. Gastrohepatic ligament node measures 1.2 cm and a S.U.V. max of 5.9 on image 118/4. Minimal extension of hypermetabolic adenopathy within the upper pelvis. A left common iliac node measures 1.0 cm and a S.U.V. max of 5.0 on image 156/4. Incidental CT findings: Abdominal aortic atherosclerosis. Scattered colonic diverticula. Mild prostatomegaly. Small bilateral hydroceles may be physiologic. SKELETON: No abnormal marrow activity. 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 IMPRESSION: 1. Hypermetabolic adenopathy within the chest, abdomen, and upper pelvis, most consistent with a lymphoproliferative process. 2.  Aortic Atherosclerosis (ICD10-I70.0). Electronically Signed   By: Abigail Miyamoto M.D.   On: 12/19/2018 09:31   Ct Angio Chest Aorta W/cm &/or Wo/cm  Result Date: 12/11/2018 CLINICAL DATA:  Status post aortic valve replacement. EXAM: CT ANGIOGRAPHY CHEST WITH CONTRAST TECHNIQUE: Multidetector CT imaging of the chest was performed using the standard protocol during bolus administration of intravenous contrast. Multiplanar CT image reconstructions and MIPs were obtained to evaluate the vascular anatomy. CONTRAST:  75 mL ISOVUE-370 IOPAMIDOL (ISOVUE-370) INJECTION 76% COMPARISON:  CT scan of Nov 16, 2017. FINDINGS: Cardiovascular: Status post aortic valve replacement. Status post surgical repair of ascending thoracic aorta. Status post coronary artery bypass graft. No aneurysm or dissection is noted. Great vessels are widely patent without significant stenosis. Normal cardiac size. No pericardial effusion. Mediastinum/Nodes: No enlarged mediastinal, hilar, or axillary lymph nodes. Thyroid gland, trachea, and esophagus  demonstrate no significant findings. Lungs/Pleura: Lungs are clear. No pleural effusion or pneumothorax. Upper Abdomen: There is noted periaortic adenopathy which appears to be increased in size compared  to prior exam. The largest lymph node measures 2.9 cm. Musculoskeletal: No chest wall abnormality. No acute or significant osseous findings. Review of the MIP images confirms the above findings. IMPRESSION: Patient is status post aortic valve replacement with surgical repair of ascending thoracic aorta and coronary artery bypass graft. These are stable findings with no evidence of dissection or aneurysm. However, there appears to be interval development of significantly enlarged periaortic adenopathy in the visualized portion of the upper abdomen. This is concerning for possible lymphoma or other malignancy. CT scan of the abdomen and pelvis with intravenous contrast is recommended for further evaluation. These results will be called to the ordering clinician or representative by the Radiologist Assistant, and communication documented in the PACS or zVision Dashboard. Electronically Signed   By: Marijo Conception M.D.   On: 12/11/2018 15:00    Labs:  CBC: Recent Labs    12/25/18 1020  WBC 5.3  HGB 14.9  HCT 42.2  PLT 220    COAGS: No results for input(s): INR, APTT in the last 8760 hours.  BMP: Recent Labs    05/01/18 1546  NA 136  K 4.1  CL 106  CO2 24  GLUCOSE 97  BUN 16  CALCIUM 9.5  CREATININE 0.94  GFRNONAA >60  GFRAA >60    LIVER FUNCTION TESTS: No results for input(s): BILITOT, AST, ALT, ALKPHOS, PROT, ALBUMIN in the last 8760 hours.  TUMOR MARKERS: No results for input(s): AFPTM, CEA, CA199, CHROMGRNA in the last 8760 hours.  Assessment and Plan:  Periaortic lymphadenopathy  Will proceed with image guided biopsy of left periaortic lymph node by Dr.Henn.  Risks and benefits of biopsy of left periaortic lymph node was discussed with the patient and/or patient's  family including, but not limited to bleeding, infection, damage to adjacent structures or low yield requiring additional tests.  All of the questions were answered and there is agreement to proceed.  Consent signed and in chart.  Thank you for this interesting consult.  I greatly enjoyed meeting Usher R Cates and look forward to participating in their care.  A copy of this report was sent to the requesting provider on this date.  Electronically Signed: Murrell Redden, PA-C   12/25/2018, 10:57 AM      I spent a total of  30 Minutes in face to face in clinical consultation, greater than 50% of which was counseling/coordinating care for lymph node biopsy.

## 2018-12-31 ENCOUNTER — Telehealth: Payer: Self-pay

## 2018-12-31 ENCOUNTER — Encounter: Payer: Self-pay | Admitting: Hematology

## 2018-12-31 NOTE — Telephone Encounter (Signed)
Called and left voicemail regarding pre-screening questions for appt on 6/23  

## 2018-12-31 NOTE — Progress Notes (Signed)
Returned pt's call to discuss financial concerns because he's unemployed and his ins will end at the end of this month.  I informed him because there's no treatment plan in place it's hard to say what's available but once his ins ends the hospital will give him an automatic 56% self-pay discount and he can apply for an additional discount thru the hospital if approved.  He hasn't applied for Medicaid so I will give him an application on 07/24/62.  I also let him know that once his treatment plan has been established there could be drug replacement available and Rob will contact him if that's the case and Marguarite Arbour will contact him for any other services that may be available.  He verbalized understanding.

## 2019-01-01 ENCOUNTER — Other Ambulatory Visit: Payer: Self-pay

## 2019-01-01 ENCOUNTER — Inpatient Hospital Stay: Payer: BC Managed Care – PPO | Attending: Hematology | Admitting: Hematology

## 2019-01-01 ENCOUNTER — Inpatient Hospital Stay: Payer: BC Managed Care – PPO

## 2019-01-01 VITALS — BP 155/99 | HR 61 | Temp 98.5°F | Resp 18 | Ht 70.0 in | Wt 228.1 lb

## 2019-01-01 DIAGNOSIS — C8338 Diffuse large B-cell lymphoma, lymph nodes of multiple sites: Secondary | ICD-10-CM

## 2019-01-01 DIAGNOSIS — I10 Essential (primary) hypertension: Secondary | ICD-10-CM | POA: Diagnosis not present

## 2019-01-01 DIAGNOSIS — E669 Obesity, unspecified: Secondary | ICD-10-CM | POA: Diagnosis not present

## 2019-01-01 DIAGNOSIS — C8330 Diffuse large B-cell lymphoma, unspecified site: Secondary | ICD-10-CM | POA: Insufficient documentation

## 2019-01-01 DIAGNOSIS — I255 Ischemic cardiomyopathy: Secondary | ICD-10-CM | POA: Diagnosis not present

## 2019-01-01 DIAGNOSIS — Z87891 Personal history of nicotine dependence: Secondary | ICD-10-CM

## 2019-01-01 LAB — CBC WITH DIFFERENTIAL/PLATELET
Abs Immature Granulocytes: 0.01 10*3/uL (ref 0.00–0.07)
Basophils Absolute: 0 10*3/uL (ref 0.0–0.1)
Basophils Relative: 0 %
Eosinophils Absolute: 0.1 10*3/uL (ref 0.0–0.5)
Eosinophils Relative: 2 %
HCT: 43.6 % (ref 39.0–52.0)
Hemoglobin: 15.3 g/dL (ref 13.0–17.0)
Immature Granulocytes: 0 %
Lymphocytes Relative: 25 %
Lymphs Abs: 1.3 10*3/uL (ref 0.7–4.0)
MCH: 32.5 pg (ref 26.0–34.0)
MCHC: 35.1 g/dL (ref 30.0–36.0)
MCV: 92.6 fL (ref 80.0–100.0)
Monocytes Absolute: 0.5 10*3/uL (ref 0.1–1.0)
Monocytes Relative: 9 %
Neutro Abs: 3.5 10*3/uL (ref 1.7–7.7)
Neutrophils Relative %: 64 %
Platelets: 238 10*3/uL (ref 150–400)
RBC: 4.71 MIL/uL (ref 4.22–5.81)
RDW: 11.4 % — ABNORMAL LOW (ref 11.5–15.5)
WBC: 5.4 10*3/uL (ref 4.0–10.5)
nRBC: 0 % (ref 0.0–0.2)

## 2019-01-01 LAB — CMP (CANCER CENTER ONLY)
ALT: 16 U/L (ref 0–44)
AST: 17 U/L (ref 15–41)
Albumin: 3.9 g/dL (ref 3.5–5.0)
Alkaline Phosphatase: 46 U/L (ref 38–126)
Anion gap: 7 (ref 5–15)
BUN: 12 mg/dL (ref 8–23)
CO2: 27 mmol/L (ref 22–32)
Calcium: 9.4 mg/dL (ref 8.9–10.3)
Chloride: 104 mmol/L (ref 98–111)
Creatinine: 1.03 mg/dL (ref 0.61–1.24)
GFR, Est AFR Am: >60 mL/min (ref >60)
GFR, Estimated: >60 mL/min (ref >60)
Glucose, Bld: 95 mg/dL (ref 70–99)
Potassium: 4.4 mmol/L (ref 3.5–5.1)
Sodium: 138 mmol/L (ref 135–145)
Total Bilirubin: 0.7 mg/dL (ref 0.3–1.2)
Total Protein: 7.5 g/dL (ref 6.5–8.1)

## 2019-01-01 LAB — LACTATE DEHYDROGENASE: LDH: 179 U/L (ref 98–192)

## 2019-01-01 NOTE — Patient Instructions (Signed)
Thank you for choosing [facility] to provide your oncology and hematology care.   Should you have questions after your visit to the [facility] ([facility]), please contact this office at [phone] between 8:30 AM and 4:30 PM. Voicemails left after 4:00 PM may not be returned until the following business day. Calls received after 4:30 PM will be answered by an off-site Nurse Triage Line.    Prescription Refills:  Please have your pharmacy contact us directly for most prescription requests.  Contact the office directly for refills of narcotics (pain medications). Allow 48-72 hours for refills.  Appointments: Please contact the [facility] scheduling department [phone] for questions regarding [facility] appointment scheduling.  Contact the schedulers with any scheduling changes so that your appointment can be rescheduled in a timely manner.   Central Scheduling for [place] [facility] [phone] - Call to schedule procedures such as PET scans, CT scans, MRI, Ultrasound, etc.  To afford each patient quality time with our providers, please arrive 30 minutes before your scheduled appointment time.  If you arrive late for your appointment, you may be asked to reschedule.  We strive to give you quality time with our providers, and arriving late affects you and other patients whose appointments are after yours. If you are a no show for multiple scheduled visits, you may be dismissed from the clinic at the providers discretion.     Resources: [facility] Social Workers [phone] for additional information on assistance programs --Anne Cunningham/Abigail Elmore  [place] DSS  [phone]: Information regarding food stamps, Medicaid, and utility assistance [place] [phone]   [place] [employer]'s shared-ride transportation service for eligible riders who have a disability that prevents them from riding the fixed route bus.   Medicare Rights Center 800-333-4114 Helps people with  Medicare understand their rights and benefits, navigate the Medicare system, and secure the quality healthcare they deserve [employer] 800-227-2345 Assists patients locate various types of support and financial assistance Cancer Care: 1-800-813-HOPE (4673) Provides financial assistance, online support groups, medication/co-pay assistance.      

## 2019-01-01 NOTE — Progress Notes (Signed)
HEMATOLOGY/ONCOLOGY CONSULTATION NOTE  Date of Service: 01/01/2019  Patient Care Team: Angelina Sheriff, MD as PCP - General (Family Medicine)  CHIEF COMPLAINTS/PURPOSE OF CONSULTATION:  Newly diagnosed Diffuse Large B-Cell Lymphoma  HISTORY OF PRESENTING ILLNESS:   William Contreras is a wonderful 63 y.o. male who has been referred to [place] by Dr. Lanelle Bal in Cardiothoracic surgery for evaluation and management of his Newly diagnosed Diffuse Large B-Cell Lymphoma. The pt reports that he is doing well overall.   The pt reports that he has had a history of CABG x2 alongside replacing his aortic valve in 2018. His last ECHO was in November 2019 which revealed an LV EF of 45%. He was seen to have enlarged lymph nodes incidentally on a follow up CT with his cardiothoracic surgeon in May 2020. He denies fevers, chills, night sweats, unexpected weight loss. He has not noticed any new lumps or bumps and denies feeling differently presently as compared to 6 months ago. He denies any limiting symptoms when walking or with mild exercise. He denies any CHF exacerbations. The pt also notes a history of HLD and notes that he took statins for a little while which cause numbness and tingling in his toes and made him "feel mentally affected." He notes that he has well controlled HTN and endorses much improved GERD.  The pt notes that he does not have sleep apnea, there was once concern, but he lost weight to address this concern.  Of note prior to the patient's visit today, pt has had a PET/CT completed on 12/19/18 with results revealing "Hypermetabolic adenopathy within the chest, abdomen, and upper pelvis, most consistent with a lymphoproliferative process. 2. Aortic Atherosclerosis."   Most recent lab results (12/25/18) of CBC is as follows: all values are WNL.  On review of systems, pt reports stable energy levels, stable weight, and denies fevers, chills, night sweats, unexpected weight loss,  new lumps or bumps, difficulty breathing, SOB, CP, problems swallowing, dental concerns, painful teeth, recent infections, pain along the spine, abdominal pains, leg swelling, and any other symptoms.   On PMHx the pt reports s/p replacement of ascending aorta and aortic valve in 2018, HLD, HTN, CHF. On Social Hx the pt reports that he quit smoking cigarettes more than 20 years ago and denies any alcohol consumption. Works as a Environmental education officer. On Family Hx the pt reports brother with unspecified leukemia, father killed when pt was 24 years old. Mother well and living in 60s without medical concerns.   MEDICAL HISTORY:  Past Medical History:  Diagnosis Date   CHF (congestive heart failure) (HCC)    Coronary artery disease    Essential hypertension    GERD (gastroesophageal reflux disease)    Heart murmur    Morbid obesity ([place])    Neuromuscular disorder ([place])    burning in both feet - pt. thinks its related to statin    Sleep apnea    told that he was borderline, but he has lost 50 lbs & is feeling much better     SURGICAL HISTORY: Past Surgical History:  Procedure Laterality Date   AORTIC VALVE REPLACEMENT N/A 03/14/2017   Procedure: AORTIC VALVE REPLACEMENT (AVR);  Surgeon: Grace Isaac, MD;  Location: [facility];  Service: Open Heart Surgery;  Laterality: N/A;  Using 42mm Edwards Magna Ease Bioprosthetic Aortic Valve   CARDIAC CATHETERIZATION N/A 03/10/2015   Procedure: Right/Left Heart Cath and Coronary Angiography;  Surgeon: Larey Dresser, MD;  Location:  [facility];  Service: Cardiovascular;  Laterality: N/A;   CARDIAC CATHETERIZATION N/A 03/10/2015   Procedure: Coronary Stent Intervention;  Surgeon: Leonie Man, MD;  Location: [facility];  Service: Cardiovascular;  Laterality: N/A;   CORONARY ARTERY BYPASS GRAFT N/A 03/14/2017   Procedure: CORONARY ARTERY BYPASS GRAFTING (CABG), ON PUMP, TIMES TWO, USING LEFT INTERNAL MAMMARY ARTERY AND ENDOSCOPICALLY  HARVESTED LEFT GREATER SAPHENOUS VEIN;  Surgeon: Grace Isaac, MD;  Location: [facility];  Service: Open Heart Surgery;  Laterality: N/A;  LIMA to LAD SVG to RAMUS INTERMEDIATE   REPLACEMENT ASCENDING AORTA N/A 03/14/2017   Procedure: REPLACEMENT ASCENDING AORTA WITH WHEAT PROCEDURE;  Surgeon: Grace Isaac, MD;  Location: [facility];  Service: Open Heart Surgery;  Laterality: N/A;  Using 62mm Hemashield Platinum Woven Double Velour Vascular Graft   TEE WITHOUT CARDIOVERSION N/A 03/14/2017   Procedure: TRANSESOPHAGEAL ECHOCARDIOGRAM (TEE);  Surgeon: Grace Isaac, MD;  Location: [facility];  Service: Open Heart Surgery;  Laterality: N/A;   TONSILLECTOMY      SOCIAL HISTORY: Social History   Socioeconomic History   Marital status: Married    Spouse name: Not on file   Number of children: Not on file   Years of education: Not on file   Highest education level: Not on file  Occupational History   Not on file  Social Needs   Financial resource strain: Not on file   Food insecurity    Worry: Not on file    Inability: Not on file   Transportation needs    Medical: Not on file    Non-medical: Not on file  Tobacco Use   Smoking status: Former Smoker    Packs/day: 1.00    Years: 15.00    Pack years: 15.00    Types: Cigarettes   Smokeless tobacco: Never Used   Tobacco comment: quit smoking ~ 20 yrs ago.  Substance and Sexual Activity   Alcohol use: No    Alcohol/week: 0.0 standard drinks    Comment: prev drank - none in ~ 25 yrs.   Drug use: No    Comment: prev smoked MJ, none in 25 yrs.   Sexual activity: Yes  Lifestyle   Physical activity    Days per week: Not on file    Minutes per session: Not on file   Stress: Not on file  Relationships   Social connections    Talks on phone: Not on file    Gets together: Not on file    Attends religious service: Not on file    Active member of club or organization: Not on file    Attends meetings of clubs or  organizations: Not on file    Relationship status: Not on file   Intimate partner violence    Fear of current or ex partner: Not on file    Emotionally abused: Not on file    Physically abused: Not on file    Forced sexual activity: Not on file  Other Topics Concern   Not on file  Social History Narrative   Pt lives in [place] with his wife and son.  He has 2 step children and another child - all grown and out of the house.    FAMILY HISTORY: Family History  Problem Relation Age of Onset   Other Father        died young in MVA.   Heart attack Mother        alive @ 58.  Other Brother        A & W   Other Sister        A & W    ALLERGIES:  is allergic to lipitor [atorvastatin] and penicillins.  MEDICATIONS:  Current Outpatient Medications  Medication Sig Dispense Refill   acetaminophen (TYLENOL) 325 MG tablet Take 2 tablets (650 mg total) by mouth every 6 (six) hours as needed for mild pain.     aspirin EC 81 MG tablet Take 1 tablet (81 mg total) by mouth daily.     calcium citrate-vitamin D (CITRACAL+D) 315-200 MG-UNIT tablet Take 1 tablet by mouth daily.     carvedilol (COREG) 6.25 MG tablet TAKE 1 TABLET BY MOUTH TWICE DAILY 60 tablet 3   Coenzyme Q10 (COQ10) 400 MG CAPS Take 400 mg by mouth 2 (two) times daily.     Flax OIL Take 1 capsule by mouth daily.     Lactobacillus (ULTIMATE PROBIOTIC FORMULA) CAPS Take 1 capsule by mouth daily.      lisinopril (PRINIVIL,ZESTRIL) 5 MG tablet TAKE 1 TABLET BY MOUTH TWICE DAILY (Patient taking differently: Take 5 mg by mouth daily. ) 60 tablet 6   Magnesium 400 MG TABS Take 400 mg by mouth 2 (two) times daily.      Omega-3 Fatty Acids (FISH OIL CONCENTRATE PO) Take 1 capsule by mouth daily.     oxymetazoline (AFRIN) 0.05 % nasal spray Place 1 spray into both nostrils at bedtime as needed for congestion.     spironolactone (ALDACTONE) 25 MG tablet Take 1/2 (one-half) tablet by mouth once daily 45 tablet 1   No  current facility-administered medications for this visit.     REVIEW OF SYSTEMS:    10 Point review of Systems was done is negative except as noted above.  PHYSICAL EXAMINATION: ECOG PERFORMANCE STATUS: 1 - Symptomatic but completely ambulatory  . Vitals:   01/01/19 1106  BP: (!) 155/99  Pulse: 61  Resp: 18  Temp: 98.5 F (36.9 C)  SpO2: 100%   Filed Weights   01/01/19 1106  Weight: 228 lb 1.6 oz (103.5 kg)   .Body mass index is 32.73 kg/m.  GENERAL:alert, in no acute distress and comfortable SKIN: no acute rashes, no significant lesions EYES: conjunctiva are pink and non-injected, sclera anicteric OROPHARYNX: MMM, no exudates, no oropharyngeal erythema or ulceration NECK: supple, no JVD LYMPH:  no palpable lymphadenopathy in the cervical, axillary or inguinal regions LUNGS: clear to auscultation b/l with normal respiratory effort HEART: regular rate & rhythm ABDOMEN:  normoactive bowel sounds , non tender, not distended. No palpable hepatosplenomegaly. Extremity: no pedal edema PSYCH: alert & oriented x 3 with fluent speech NEURO: no focal motor/sensory deficits  LABORATORY DATA:  I have reviewed the data as listed  . CBC Latest Ref Rng & Units 12/25/2018 05/11/2017 03/31/2017  WBC 4.0 - 10.5 K/uL 5.3 7.5 7.5  Hemoglobin 13.0 - 17.0 g/dL 14.9 14.2 11.8(L)  Hematocrit 39.0 - 52.0 % 42.2 41.3 35.0(L)  Platelets 150 - 400 K/uL 220 273 452(H)    . CMP Latest Ref Rng & Units 05/01/2018 09/05/2017 06/05/2017  Glucose 70 - 99 mg/dL 97 99 91  BUN 6 - 20 mg/dL 16 16 15   Creatinine 0.61 - 1.24 mg/dL 0.94 1.00 0.90  Sodium 135 - 145 mmol/L 136 135 136  Potassium 3.5 - 5.1 mmol/L 4.1 4.1 3.9  Chloride 98 - 111 mmol/L 106 101 104  CO2 22 - 32 mmol/L 24 24 25   Calcium  8.9 - 10.3 mg/dL 9.5 9.4 9.2  Total Protein 6.5 - 8.1 g/dL - - -  Total Bilirubin 0.3 - 1.2 mg/dL - - -  Alkaline Phos 38 - 126 U/L - - -  AST 15 - 41 U/L - - -  ALT 17 - 63 U/L - - -    12/25/18 Left  Retroperitoneal Biopsy:    RADIOGRAPHIC STUDIES: I have personally reviewed the radiological images as listed and agreed with the findings in the report. Nm Pet Image Initial (pi) Skull Base To Thigh  Result Date: 12/19/2018 CLINICAL DATA:  Initial treatment strategy for abdominal adenopathy. Intraabdominal/pelvic swelling, mass. EXAM: NUCLEAR MEDICINE PET SKULL BASE TO THIGH TECHNIQUE: 11.2 mCi F-18 FDG was injected intravenously. Full-ring PET imaging was performed from the skull base to thigh after the radiotracer. CT data was obtained and used for attenuation correction and anatomic localization. Fasting blood glucose: 104 mg/dl COMPARISON:  Chest CT of 12/11/2018. FINDINGS: Mediastinal blood pool activity: SUV max 2.3 Liver activity: SUV max 4.4 NECK: No areas of abnormal hypermetabolism. Incidental CT findings: No cervical adenopathy. CHEST: High right paratracheal node measures 11 mm and a S.U.V. max of 6.3 on image 61/4. A left paratracheal/middle mediastinal node measures 1.0 cm and a S.U.V. max of 6.0 on image 75/4. Incidental CT findings: Deferred to recent diagnostic CT. Aortic valve repair. Median sternotomy. Cardiomegaly. ABDOMEN/PELVIS: A preaortic node measures 2.9 cm and a S.U.V. max of 27.5 on image 137/4. Gastrohepatic ligament node measures 1.2 cm and a S.U.V. max of 5.9 on image 118/4. Minimal extension of hypermetabolic adenopathy within the upper pelvis. A left common iliac node measures 1.0 cm and a S.U.V. max of 5.0 on image 156/4. Incidental CT findings: Abdominal aortic atherosclerosis. Scattered colonic diverticula. Mild prostatomegaly. Small bilateral hydroceles may be physiologic. SKELETON: No abnormal marrow activity. 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 IMPRESSION: 1. Hypermetabolic adenopathy within the chest, abdomen, and upper pelvis, most consistent with a lymphoproliferative process. 2.  Aortic Atherosclerosis  (ICD10-I70.0). Electronically Signed   By: Abigail Miyamoto M.D.   On: 12/19/2018 09:31   Ct Biopsy  Result Date: 12/25/2018 INDICATION: 63 year old with chest and abdominal lymphadenopathy of unknown etiology. Concern for a lymphoproliferative process. Tissue sampling is needed. EXAM: CT-GUIDED LEFT RETROPERITONEAL LYMPH NODE BIOPSY MEDICATIONS: None. ANESTHESIA/SEDATION: Moderate (conscious) sedation was employed during this procedure. A total of Versed 2.0 mg and Fentanyl 75 mcg was administered intravenously. Moderate Sedation Time: 23 minutes. The patient's level of consciousness and vital signs were monitored continuously by radiology nursing throughout the procedure under my direct supervision. FLUOROSCOPY TIME:  None COMPLICATIONS: None immediate. PROCEDURE: Informed written consent was obtained from the patient after a thorough discussion of the procedural risks, benefits and alternatives. All questions were addressed. A timeout was performed prior to the initiation of the procedure. Patient was placed prone. CT images through the abdomen were obtained. The left periaortic lymph nodes were targeted for biopsy. The left side of the back was prepped with chlorhexidine and a sterile field was created. Skin and soft tissues were anesthetized with 1% lidocaine. Using CT guidance, a 17 gauge coaxial needle was directed into a left periaortic lymph node. Multiple core biopsies were obtained with an 18 gauge core device. Specimens placed in saline. 17 gauge needle was removed without complication. Bandage placed over the puncture site. FINDINGS: Several small periaortic lymph nodes. Largest lymph nodes are just anterior to the aorta. Lymph node just left of the aorta  was sampled. Multiple small core biopsies were obtained. IMPRESSION: CT-guided core biopsies of a left retroperitoneal lymph node. Electronically Signed   By: Markus Daft M.D.   On: 12/25/2018 17:23   Ct Angio Chest Aorta W/cm &/or Wo/cm  Result  Date: 12/11/2018 CLINICAL DATA:  Status post aortic valve replacement. EXAM: CT ANGIOGRAPHY CHEST WITH CONTRAST TECHNIQUE: Multidetector CT imaging of the chest was performed using the standard protocol during bolus administration of intravenous contrast. Multiplanar CT image reconstructions and MIPs were obtained to evaluate the vascular anatomy. CONTRAST:  75 mL ISOVUE-370 IOPAMIDOL (ISOVUE-370) INJECTION 76% COMPARISON:  CT scan of Nov 16, 2017. FINDINGS: Cardiovascular: Status post aortic valve replacement. Status post surgical repair of ascending thoracic aorta. Status post coronary artery bypass graft. No aneurysm or dissection is noted. Great vessels are widely patent without significant stenosis. Normal cardiac size. No pericardial effusion. Mediastinum/Nodes: No enlarged mediastinal, hilar, or axillary lymph nodes. Thyroid gland, trachea, and esophagus demonstrate no significant findings. Lungs/Pleura: Lungs are clear. No pleural effusion or pneumothorax. Upper Abdomen: There is noted periaortic adenopathy which appears to be increased in size compared to prior exam. The largest lymph node measures 2.9 cm. Musculoskeletal: No chest wall abnormality. No acute or significant osseous findings. Review of the MIP images confirms the above findings. IMPRESSION: Patient is status post aortic valve replacement with surgical repair of ascending thoracic aorta and coronary artery bypass graft. These are stable findings with no evidence of dissection or aneurysm. However, there appears to be interval development of significantly enlarged periaortic adenopathy in the visualized portion of the upper abdomen. This is concerning for possible lymphoma or other malignancy. CT scan of the abdomen and pelvis with intravenous contrast is recommended for further evaluation. These results will be called to the ordering clinician or representative by the Radiologist Assistant, and communication documented in the PACS or zVision  Dashboard. Electronically Signed   By: Marijo Conception M.D.   On: 12/11/2018 15:00    ASSESSMENT & PLAN:  63 y.o. male with  1. Newly diagnosed Diffuse Large B-Cell Lymphoma, Stage III-A -Discussed patient's most recent labs from 12/25/18, blood counts normal including WBC at 5.3k, HGB at 14.9 and PLT at 220k -Discussed the 12/25/18 Left retroperitoneal biopsy which revealed Diffuse Large B-Cell Lymphoma -Discussed the 12/19/18 PET/CT which revealed "Hypermetabolic adenopathy within the chest, abdomen, and upper pelvis, most consistent with a lymphoproliferative process. 2. Aortic Atherosclerosis." -Discussed the diagnosis, natural history, indication for impending treatment, and the curative intent of treatment. -Discussed that in general we would recommend treatment of up to 4-6 cycles of R-CHOP with G-CSF support as per NCCN guidelines, however an important consideration for this pt in regards to treatment planning will be his CHF which I will discuss further with our tumor board. Liposomal preparation of Doxorubicin vs infusion regimen of dose adjusted EPOCH-R as possibilities? -Discussed that the pt's last LV EF is borderline, and pt is not limited functionally in any way by his CHF -Will order ECHO. Most recent was in November 2019.  -Will also discuss with Dr. Loralie Champagne in Cardiology -Will order labs today -Will refer the pt to IR for port placement -Will see the pt back in 8-9 days with phone visit   Labs today ECHO in 3-4 days Port a cath placement in 4-5 days Cardiology followup with Dr Loralie Champagne for cardiac evaluation/optimization pre-chemotherapy Telephone visit with Dr Irene Limbo next Thursday 8-9 days)   All of the patients questions were answered with apparent satisfaction.  The patient knows to call the clinic with any problems, questions or concerns.  The total time spent in the appt was 60 minutes and more than 50% was on counseling and direct patient cares.    Sullivan Lone MD MS AAHIVMS [facility] Hematology/Oncology Physician [facility]  (Office):       [phone] (Work cell):  [phone] (Fax):           [phone]  01/01/2019 12:33 PM  I, Baldwin Jamaica, am acting as a scribe for Dr. Sullivan Lone.   .I have reviewed the above documentation for accuracy and completeness, and I agree with the above. Brunetta Genera MD

## 2019-01-02 ENCOUNTER — Telehealth: Payer: Self-pay | Admitting: Hematology

## 2019-01-02 ENCOUNTER — Other Ambulatory Visit (HOSPITAL_COMMUNITY): Payer: Self-pay | Admitting: Internal Medicine

## 2019-01-02 LAB — HEPATITIS B SURFACE ANTIGEN: Hepatitis B Surface Ag: NEGATIVE

## 2019-01-02 LAB — HEPATITIS B CORE ANTIBODY, TOTAL: Hep B Core Total Ab: NEGATIVE

## 2019-01-02 LAB — HEPATITIS C ANTIBODY: HCV Ab: <0.1 s/co ratio (ref 0.0–0.9)

## 2019-01-02 NOTE — Telephone Encounter (Signed)
Scheduled appt per 6/23 los. Spoke with patient and patient aware of appt date and time.

## 2019-01-03 ENCOUNTER — Ambulatory Visit: Payer: BLUE CROSS/BLUE SHIELD | Admitting: Cardiothoracic Surgery

## 2019-01-03 ENCOUNTER — Other Ambulatory Visit: Payer: BLUE CROSS/BLUE SHIELD

## 2019-01-04 ENCOUNTER — Other Ambulatory Visit: Payer: Self-pay | Admitting: Physician Assistant

## 2019-01-04 ENCOUNTER — Ambulatory Visit (HOSPITAL_COMMUNITY)
Admission: RE | Admit: 2019-01-04 | Discharge: 2019-01-04 | Disposition: A | Discharge status: Home / Self Care | Payer: BC Managed Care – PPO | Source: Ambulatory Visit | Attending: Hematology | Admitting: Hematology

## 2019-01-04 ENCOUNTER — Other Ambulatory Visit: Payer: Self-pay

## 2019-01-04 DIAGNOSIS — I509 Heart failure, unspecified: Secondary | ICD-10-CM | POA: Insufficient documentation

## 2019-01-04 DIAGNOSIS — C8338 Diffuse large B-cell lymphoma, lymph nodes of multiple sites: Secondary | ICD-10-CM | POA: Diagnosis not present

## 2019-01-04 DIAGNOSIS — I2510 Atherosclerotic heart disease of native coronary artery without angina pectoris: Secondary | ICD-10-CM | POA: Insufficient documentation

## 2019-01-04 DIAGNOSIS — I110 Hypertensive heart disease with heart failure: Secondary | ICD-10-CM | POA: Diagnosis not present

## 2019-01-04 DIAGNOSIS — Z951 Presence of aortocoronary bypass graft: Secondary | ICD-10-CM | POA: Diagnosis not present

## 2019-01-04 NOTE — Progress Notes (Signed)
  Echocardiogram 2D Echocardiogram has been performed.  Jannett Celestine 01/04/2019, 9:48 AM

## 2019-01-07 ENCOUNTER — Other Ambulatory Visit: Payer: Self-pay | Admitting: Radiology

## 2019-01-08 ENCOUNTER — Telehealth: Payer: Self-pay | Admitting: Hematology

## 2019-01-08 ENCOUNTER — Encounter (HOSPITAL_COMMUNITY): Payer: Self-pay

## 2019-01-08 ENCOUNTER — Other Ambulatory Visit: Payer: Self-pay

## 2019-01-08 ENCOUNTER — Ambulatory Visit (HOSPITAL_COMMUNITY)
Admission: RE | Admit: 2019-01-08 | Discharge: 2019-01-08 | Disposition: A | Discharge status: Home / Self Care | Payer: BC Managed Care – PPO | Source: Ambulatory Visit | Attending: Hematology | Admitting: Hematology

## 2019-01-08 DIAGNOSIS — Z7982 Long term (current) use of aspirin: Secondary | ICD-10-CM | POA: Diagnosis not present

## 2019-01-08 DIAGNOSIS — Z79899 Other long term (current) drug therapy: Secondary | ICD-10-CM | POA: Diagnosis not present

## 2019-01-08 DIAGNOSIS — Z951 Presence of aortocoronary bypass graft: Secondary | ICD-10-CM | POA: Insufficient documentation

## 2019-01-08 DIAGNOSIS — Z955 Presence of coronary angioplasty implant and graft: Secondary | ICD-10-CM | POA: Diagnosis not present

## 2019-01-08 DIAGNOSIS — I2510 Atherosclerotic heart disease of native coronary artery without angina pectoris: Secondary | ICD-10-CM | POA: Diagnosis not present

## 2019-01-08 DIAGNOSIS — I110 Hypertensive heart disease with heart failure: Secondary | ICD-10-CM | POA: Diagnosis not present

## 2019-01-08 DIAGNOSIS — K219 Gastro-esophageal reflux disease without esophagitis: Secondary | ICD-10-CM | POA: Insufficient documentation

## 2019-01-08 DIAGNOSIS — C8338 Diffuse large B-cell lymphoma, lymph nodes of multiple sites: Secondary | ICD-10-CM

## 2019-01-08 DIAGNOSIS — G4730 Sleep apnea, unspecified: Secondary | ICD-10-CM | POA: Insufficient documentation

## 2019-01-08 DIAGNOSIS — Z880 Allergy status to penicillin: Secondary | ICD-10-CM | POA: Diagnosis not present

## 2019-01-08 DIAGNOSIS — Z6831 Body mass index (BMI) 31.0-31.9, adult: Secondary | ICD-10-CM | POA: Insufficient documentation

## 2019-01-08 DIAGNOSIS — Z953 Presence of xenogenic heart valve: Secondary | ICD-10-CM | POA: Diagnosis not present

## 2019-01-08 DIAGNOSIS — I509 Heart failure, unspecified: Secondary | ICD-10-CM | POA: Diagnosis not present

## 2019-01-08 DIAGNOSIS — R011 Cardiac murmur, unspecified: Secondary | ICD-10-CM | POA: Diagnosis not present

## 2019-01-08 HISTORY — PX: IR IMAGING GUIDED PORT INSERTION: IMG5740

## 2019-01-08 LAB — CBC
HCT: 44.1 % (ref 39.0–52.0)
Hemoglobin: 15.5 g/dL (ref 13.0–17.0)
MCH: 32.8 pg (ref 26.0–34.0)
MCHC: 35.1 g/dL (ref 30.0–36.0)
MCV: 93.4 fL (ref 80.0–100.0)
Platelets: 241 10*3/uL (ref 150–400)
RBC: 4.72 MIL/uL (ref 4.22–5.81)
RDW: 11.5 % (ref 11.5–15.5)
WBC: 5.7 10*3/uL (ref 4.0–10.5)
nRBC: 0 % (ref 0.0–0.2)

## 2019-01-08 LAB — APTT: aPTT: 28 seconds (ref 24–36)

## 2019-01-08 LAB — PROTIME-INR
INR: 1 (ref 0.8–1.2)
Prothrombin Time: 13.1 seconds (ref 11.4–15.2)

## 2019-01-08 MED ORDER — HEPARIN SOD (PORK) LOCK FLUSH 100 UNIT/ML IV SOLN
INTRAVENOUS | Status: AC
  Filled 2019-01-08: qty 5

## 2019-01-08 MED ORDER — MIDAZOLAM HCL 2 MG/2ML IJ SOLN
INTRAMUSCULAR | Status: AC | PRN
  Administered 2019-01-08 (×3): 1 mg via INTRAVENOUS

## 2019-01-08 MED ORDER — VANCOMYCIN HCL 10 G IV SOLR
1500.0000 mg | Freq: Once | INTRAVENOUS | Status: AC
  Administered 2019-01-08: 1500 mg via INTRAVENOUS
  Filled 2019-01-08: qty 1500

## 2019-01-08 MED ORDER — LIDOCAINE-EPINEPHRINE (PF) 2 %-1:200000 IJ SOLN
INTRAMUSCULAR | Status: AC | PRN
  Administered 2019-01-08 (×2): 10 mL

## 2019-01-08 MED ORDER — SODIUM CHLORIDE 0.9 % IV SOLN
INTRAVENOUS | Status: DC
  Administered 2019-01-08: 13:00:00 via INTRAVENOUS

## 2019-01-08 MED ORDER — FENTANYL CITRATE (PF) 100 MCG/2ML IJ SOLN
INTRAMUSCULAR | Status: AC | PRN
  Administered 2019-01-08 (×2): 50 ug via INTRAVENOUS

## 2019-01-08 MED ORDER — LIDOCAINE-EPINEPHRINE (PF) 2 %-1:200000 IJ SOLN
INTRAMUSCULAR | Status: AC
  Filled 2019-01-08: qty 20

## 2019-01-08 MED ORDER — MIDAZOLAM HCL 2 MG/2ML IJ SOLN
INTRAMUSCULAR | Status: AC
  Filled 2019-01-08: qty 4

## 2019-01-08 MED ORDER — FENTANYL CITRATE (PF) 100 MCG/2ML IJ SOLN
INTRAMUSCULAR | Status: AC
  Filled 2019-01-08: qty 2

## 2019-01-08 NOTE — Progress Notes (Signed)
Spoke with wife, Almyra Free.  She is driver. Informed her of possible d/c time.  Informed her when he returns from procedure we would call her with a more definite d/c time. She voiced understanding.

## 2019-01-08 NOTE — Procedures (Signed)
Pre Procedure Dx: Poor venous access Post Procedural Dx: Same  Successful placement of right IJ approach port-a-cath with tip at the superior caval atrial junction. The catheter is ready for immediate use.  Estimated Blood Loss: Minimal  Complications: None immediate.  Jay Lakyra Tippins, MD Pager #: [phone]   

## 2019-01-08 NOTE — Telephone Encounter (Signed)
Left message for patient to verify telephone visit for pre reg °

## 2019-01-08 NOTE — Discharge Instructions (Addendum)
Moderate Conscious Sedation, Adult, Care After These instructions provide you with information about caring for yourself after your procedure. Your health care provider may also give you more specific instructions. Your treatment has been planned according to current medical practices, but problems sometimes occur. Call your health care provider if you have any problems or questions after your procedure. What can I expect after the procedure? After your procedure, it is common:  To feel sleepy for several hours.  To feel clumsy and have poor balance for several hours.  To have poor judgment for several hours.  To vomit if you eat too soon. Follow these instructions at home: For at least 24 hours after the procedure:   Do not: ? Participate in activities where you could fall or become injured. ? Drive. ? Use heavy machinery. ? Drink alcohol. ? Take sleeping pills or medicines that cause drowsiness. ? Make important decisions or sign legal documents. ? Take care of children on your own.  Rest. Eating and drinking  Follow the diet recommended by your health care provider.  If you vomit: ? Drink water, juice, or soup when you can drink without vomiting. ? Make sure you have little or no nausea before eating solid foods. General instructions  Have a responsible adult stay with you until you are awake and alert.  Take over-the-counter and prescription medicines only as told by your health care provider.  If you smoke, do not smoke without supervision.  Keep all follow-up visits as told by your health care provider. This is important. Contact a health care provider if:  You keep feeling nauseous or you keep vomiting.  You feel light-headed.  You develop a rash.  You have a fever. Get help right away if:  You have trouble breathing. This information is not intended to replace advice given to you by your health care provider. Make sure you discuss any questions you have  with your health care provider. Document Released: 04/17/2013 Document Revised: 06/09/2017 Document Reviewed: 10/17/2015 Elsevier Patient Education  Prosser Insertion, Care After This sheet gives you information about how to care for yourself after your procedure. Your health care provider may also give you more specific instructions. If you have problems or questions, contact your health care provider. What can I expect after the procedure? After the procedure, it is common to have:  Discomfort at the port insertion site.  Bruising on the skin over the port. This should improve over 3-4 days. Follow these instructions at home: [facility] care  After your port is placed, you will get a manufacturer's information card. The card has information about your port. Keep this card with you at all times.  Take care of the port as told by your health care provider. Ask your health care provider if you or a family member can get training for taking care of the port at home. A home health care nurse may also take care of the port.  Make sure to remember what type of port you have. Incision care      Follow instructions from your health care provider about how to take care of your port insertion site. Make sure you: ? Wash your hands with soap and water before and after you change your bandage (dressing). If soap and water are not available, use hand sanitizer. ? Change your dressing as told by your health care provider. ? Leave stitches (sutures), skin glue, or adhesive strips in place. These skin closures may  need to stay in place for 2 weeks or longer. If adhesive strip edges start to loosen and curl up, you may trim the loose edges. Do not remove adhesive strips completely unless your health care provider tells you to do that.  Check your port insertion site every day for signs of infection. Check for: ? Redness, swelling, or pain. ? Fluid or blood. ? Warmth. ? Pus or a  bad smell. Activity  Return to your normal activities as told by your health care provider. Ask your health care provider what activities are safe for you.  Do not lift anything that is heavier than 10 lb (4.5 kg), or the limit that you are told, until your health care provider says that it is safe. General instructions  Take over-the-counter and prescription medicines only as told by your health care provider.  Do not take baths, swim, or use a hot tub until your health care provider approves. Ask your health care provider if you may take showers. You may only be allowed to take sponge baths.  Do not drive for 24 hours if you were given a sedative during your procedure.  Wear a medical alert bracelet in case of an emergency. This will tell any health care providers that you have a port.  Keep all follow-up visits as told by your health care provider. This is important. Contact a health care provider if:  You cannot flush your port with saline as directed, or you cannot draw blood from the port.  You have a fever or chills.  You have redness, swelling, or pain around your port insertion site.  You have fluid or blood coming from your port insertion site.  Your port insertion site feels warm to the touch.  You have pus or a bad smell coming from the port insertion site. Get help right away if:  You have chest pain or shortness of breath.  You have bleeding from your port that you cannot control. Summary  Take care of the port as told by your health care provider. Keep the manufacturer's information card with you at all times.  Change your dressing as told by your health care provider.  Contact a health care provider if you have a fever or chills or if you have redness, swelling, or pain around your port insertion site.  Keep all follow-up visits as told by your health care provider. This information is not intended to replace advice given to you by your health care  provider. Make sure you discuss any questions you have with your health care provider. Document Released: 04/17/2013 Document Revised: 01/23/2018 Document Reviewed: 01/23/2018 Elsevier Patient Education  [place] Not Use Elma Cream until skin glue has come completely off, usually takes 2 weeks. You may remove dressing after 3:00 pm on 01/09/2019, shower , pat area dry, you do not have to put another dressing over the area, it will be bruised. It will be sore. You make take what you normally take for aches and pain.

## 2019-01-08 NOTE — H&P (Addendum)
Referring Physician(s): Brunetta Genera  Supervising Physician: Sandi Mariscal  Patient Status:  [facility] OP  Chief Complaint: "I'm getting a port a cath"   Subjective: Patient familiar to IR service from left retroperitoneal lymph node biopsy on 12/25/2018.  He has a history of recently diagnosed diffuse large B-cell lymphoma and presents again today for Port-A-Cath placement for chemotherapy.  He denies fever, headache, chest pain, dyspnea, cough, abdominal/back pain, nausea, vomiting or bleeding.  Past Medical History:  Diagnosis Date   CHF (congestive heart failure) (HCC)    Coronary artery disease    Essential hypertension    GERD (gastroesophageal reflux disease)    Heart murmur    Morbid obesity ([place])    Neuromuscular disorder ([place])    burning in both feet - pt. thinks its related to statin    Sleep apnea    told that he was borderline, but he has lost 50 lbs & is feeling much better    Past Surgical History:  Procedure Laterality Date   AORTIC VALVE REPLACEMENT N/A 03/14/2017   Procedure: AORTIC VALVE REPLACEMENT (AVR);  Surgeon: Grace Isaac, MD;  Location: [place];  Service: Open Heart Surgery;  Laterality: N/A;  Using 56mm Edwards Magna Ease Bioprosthetic Aortic Valve   CARDIAC CATHETERIZATION N/A 03/10/2015   Procedure: Right/Left Heart Cath and Coronary Angiography;  Surgeon: Larey Dresser, MD;  Location: [place] [facility];  Service: Cardiovascular;  Laterality: N/A;   CARDIAC CATHETERIZATION N/A 03/10/2015   Procedure: Coronary Stent Intervention;  Surgeon: Leonie Man, MD;  Location: [place] [facility];  Service: Cardiovascular;  Laterality: N/A;   CORONARY ARTERY BYPASS GRAFT N/A 03/14/2017   Procedure: CORONARY ARTERY BYPASS GRAFTING (CABG), ON PUMP, TIMES TWO, USING LEFT INTERNAL MAMMARY ARTERY AND ENDOSCOPICALLY HARVESTED LEFT GREATER SAPHENOUS VEIN;  Surgeon: Grace Isaac, MD;  Location: [place];  Service: Open Heart Surgery;  Laterality:  N/A;  LIMA to LAD SVG to RAMUS INTERMEDIATE   REPLACEMENT ASCENDING AORTA N/A 03/14/2017   Procedure: REPLACEMENT ASCENDING AORTA WITH WHEAT PROCEDURE;  Surgeon: Grace Isaac, MD;  Location: [place];  Service: Open Heart Surgery;  Laterality: N/A;  Using 8mm Hemashield Platinum Woven Double Velour Vascular Graft   TEE WITHOUT CARDIOVERSION N/A 03/14/2017   Procedure: TRANSESOPHAGEAL ECHOCARDIOGRAM (TEE);  Surgeon: Grace Isaac, MD;  Location: [place];  Service: Open Heart Surgery;  Laterality: N/A;   TONSILLECTOMY         Allergies: Lipitor [atorvastatin] and Penicillins  Medications: Prior to Admission medications   Medication Sig Start Date End Date Taking? Authorizing Provider  acetaminophen (TYLENOL) 325 MG tablet Take 2 tablets (650 mg total) by mouth every 6 (six) hours as needed for mild pain. 03/18/17   Barrett, Lodema Hong, PA-C  aspirin EC 81 MG tablet Take 1 tablet (81 mg total) by mouth daily. 04/20/17   Grace Isaac, MD  calcium citrate-vitamin D (CITRACAL+D) 315-200 MG-UNIT tablet Take 1 tablet by mouth daily.    [provider]  carvedilol (COREG) 6.25 MG tablet Take 1 tablet by mouth twice daily 01/03/19   Bensimhon, Shaune Pascal, MD  Coenzyme Q10 (COQ10) 400 MG CAPS Take 400 mg by mouth 2 (two) times daily.    [provider]  Flax OIL Take 1 capsule by mouth daily.    [provider]  Lactobacillus (ULTIMATE PROBIOTIC FORMULA) CAPS Take 1 capsule by mouth daily.     [provider]  lisinopril (PRINIVIL,ZESTRIL) 5 MG tablet  TAKE 1 TABLET BY MOUTH TWICE DAILY Patient taking differently: Take 5 mg by mouth daily.  01/03/18   Larey Dresser, MD  Magnesium 400 MG TABS Take 400 mg by mouth 2 (two) times daily.     [provider]  Omega-3 Fatty Acids (FISH OIL CONCENTRATE PO) Take 1 capsule by mouth daily.    [provider]  oxymetazoline (AFRIN) 0.05 % nasal spray Place 1 spray into both nostrils at bedtime as needed  for congestion.    [provider]  spironolactone (ALDACTONE) 25 MG tablet Take 1/2 (one-half) tablet by mouth once daily 12/04/18   Bensimhon, Shaune Pascal, MD     Vital Signs: BP (!) 138/102    Pulse 61    Temp 98.3 F (36.8 C) (Oral)    Resp 18    SpO2 96%   Physical Exam awake, alert.  Chest clear to auscultation bilaterally.  Heart with normal rate, occasional ectopy noted.  Abdomen soft, positive bowel sounds, nontender.  No significant lower extremity edema.  Imaging: No results found.  Labs:  CBC: Recent Labs    12/25/18 1020 01/01/19 1236  WBC 5.3 5.4  HGB 14.9 15.3  HCT 42.2 43.6  PLT 220 238    COAGS: Recent Labs    12/25/18 1020  INR 1.1    BMP: Recent Labs    05/01/18 1546 01/01/19 1236  NA 136 138  K 4.1 4.4  CL 106 104  CO2 24 27  GLUCOSE 97 95  BUN 16 12  CALCIUM 9.5 9.4  CREATININE 0.94 1.03  GFRNONAA >60 >60  GFRAA >60 >60    LIVER FUNCTION TESTS: Recent Labs    01/01/19 1236  BILITOT 0.7  AST 17  ALT 16  ALKPHOS 46  PROT 7.5  ALBUMIN 3.9    Assessment and Plan: Pt with history of recently diagnosed diffuse large B-cell lymphoma ; presents  today for Port-A-Cath placement for chemotherapy.  Risks and benefits of image guided port-a-catheter placement was discussed with the patient including, but not limited to bleeding, infection, pneumothorax, or fibrin sheath development and need for additional procedures.  All of the patient's questions were answered, patient is agreeable to proceed. Consent signed and in chart.  LABS PENDING   Electronically Signed: D. Rowe Robert, PA-C 01/08/2019, 1:22 PM   I spent a total of 25 minutes at the the patient's bedside AND on the patient's hospital floor or unit, greater than 50% of which was counseling/coordinating care for port a cath placement

## 2019-01-08 NOTE — Progress Notes (Signed)
HEMATOLOGY/ONCOLOGY CLINIC NOTE  Date of Service: 01/09/2019  Patient Care Team: William Sheriff, MD as PCP - General (Family Medicine)  CHIEF COMPLAINTS/PURPOSE OF CONSULTATION:  Newly diagnosed Diffuse Large B-Cell Lymphoma  HISTORY OF PRESENTING ILLNESS:   William Contreras is a wonderful 63 y.o. male who has been referred to [place] by Dr. Lanelle Contreras in Cardiothoracic surgery for evaluation and management of his Newly diagnosed Diffuse Large B-Cell Lymphoma. The pt reports that he is doing well overall.   The pt reports that he has had a history of CABG x2 alongside replacing his aortic valve in 2018. His last ECHO was in November 2019 which revealed an LV EF of 45%. He was seen to have enlarged lymph nodes incidentally on a follow up CT with his cardiothoracic surgeon in May 2020. He denies fevers, chills, night sweats, unexpected weight loss. He has not noticed any new lumps or bumps and denies feeling differently presently as compared to 6 months ago. He denies any limiting symptoms when walking or with mild exercise. He denies any CHF exacerbations. The pt also notes a history of HLD and notes that he took statins for a little while which cause numbness and tingling in his toes and made him "feel mentally affected." He notes that he has well controlled HTN and endorses much improved GERD.  The pt notes that he does not have sleep apnea, there was once concern, but he lost weight to address this concern.  Of note prior to the patient's visit today, pt has had a PET/CT completed on 12/19/18 with results revealing "Hypermetabolic adenopathy within the chest, abdomen, and upper pelvis, most consistent with a lymphoproliferative process. 2. Aortic Atherosclerosis."   Most recent lab results (12/25/18) of CBC is as follows: all values are WNL.  On review of systems, pt reports stable energy levels, stable weight, and denies fevers, chills, night sweats, unexpected weight loss, new  lumps or bumps, difficulty breathing, SOB, CP, problems swallowing, dental concerns, painful teeth, recent infections, pain along the spine, abdominal pains, leg swelling, and any other symptoms.   On PMHx the pt reports s/p replacement of ascending aorta and aortic valve in 2018, HLD, HTN, CHF. On Social Hx the pt reports that he quit smoking cigarettes more than 20 years ago and denies any alcohol consumption. Works as a Environmental education officer. On Family Hx the pt reports brother with unspecified leukemia, father killed when pt was 77 years old. Mother well and living in 27s without medical concerns.  Interval History:   I connected with William Contreras on 01/09/19 at  9:00 AM EDT by telephone and verified that I am speaking with the correct person using two identifiers.  I discussed the limitations, risks, security and privacy concerns of performing an evaluation and management service by telemedicine and the availability of in-person appointments. I also discussed with the patient that there may be a patient responsible charge related to this service. The patient expressed understanding and agreed to proceed.   Other persons participating in the visit and their role in the encounter: his wife.  Patient's location: his home Provider's location: my office at the [facility] is called today for management and evaluation of his recently diagnosed Diffuse Large B-Cell Lymphoma. The patient's last visit with [place] was on 01/01/19. The pt reports that he is doing well overall.  The pt reports that his port placement went well and was uneventful. He denies developing  any new concerns in the brief interim and denies constitutional symptoms including fevers, chills, and night sweats. He endorses feeling that he is at his normal baseline.  Of note since the patient's last visit, pt has had an ECHO completed on 01/04/19 with results revealing LV EF of 40-45%, impaired relaxation, and  moderate hypokinesis of LV, entire inferior wall and apical segment.  Lab results (01/01/19) of CBC w/diff and CMP is as follows: all values are WNL except for RDW at 11.4. 01/01/19 LDH at 179 01/01/19 Hep B and Hep C negative  On review of systems, pt reports stable energy levels, and denies fevers, chills, night sweats, and any other symptoms.   MEDICAL HISTORY:  Past Medical History:  Diagnosis Date   CHF (congestive heart failure) (HCC)    Coronary artery disease    Essential hypertension    GERD (gastroesophageal reflux disease)    Heart murmur    Morbid obesity ([place])    Neuromuscular disorder ([place])    burning in both feet - pt. thinks its related to statin    Sleep apnea    told that he was borderline, but he has lost 50 lbs & is feeling much better     SURGICAL HISTORY: Past Surgical History:  Procedure Laterality Date   AORTIC VALVE REPLACEMENT N/A 03/14/2017   Procedure: AORTIC VALVE REPLACEMENT (AVR);  Surgeon: Grace Isaac, MD;  Location: [facility];  Service: Open Heart Surgery;  Laterality: N/A;  Using 68m Edwards Magna Ease Bioprosthetic Aortic Valve   CARDIAC CATHETERIZATION N/A 03/10/2015   Procedure: Right/Left Heart Cath and Coronary Angiography;  Surgeon: DLarey Dresser MD;  [facility];  Service: Cardiovascular;  Laterality: N/A;   CARDIAC CATHETERIZATION N/A 03/10/2015   Procedure: Coronary Stent Intervention;  Surgeon: DLeonie Man MD;  [place] [facility];  Service: Cardiovascular;  Laterality: N/A;   CORONARY ARTERY BYPASS GRAFT N/A 03/14/2017   Procedure: CORONARY ARTERY BYPASS GRAFTING (CABG), ON PUMP, TIMES TWO, USING LEFT INTERNAL MAMMARY ARTERY AND ENDOSCOPICALLY HARVESTED LEFT GREATER SAPHENOUS VEIN;  Surgeon: GGrace Isaac MD;  [place] [facility]  Service: Open Heart Surgery;  Laterality: N/A;  LIMA to LAD SVG to RAMUS INTERMEDIATE   IR IMAGING GUIDED PORT INSERTION  01/08/2019   REPLACEMENT ASCENDING AORTA  N/A 03/14/2017   Procedure: REPLACEMENT ASCENDING AORTA WITH WHEAT PROCEDURE;  Surgeon: GGrace Isaac MD;  [place] [facility]  Service: Open Heart Surgery;  Laterality: N/A;  Using 336mHemashield Platinum Woven Double Velour Vascular Graft   TEE WITHOUT CARDIOVERSION N/A 03/14/2017   Procedure: TRANSESOPHAGEAL ECHOCARDIOGRAM (TEE);  Surgeon: GeGrace IsaacMD;  [facility] Service: Open Heart Surgery;  Laterality: N/A;   TONSILLECTOMY      SOCIAL HISTORY: Social History   Socioeconomic History   Marital status: Married    Spouse name: Not on file   Number of children: Not on file   Years of education: Not on file   Highest education level: Not on file  Occupational History   Not on file  Social Needs   Financial resource strain: Not on file   Food insecurity    Worry: Not on file    Inability: Not on file   Transportation needs    Medical: Not on file    Non-medical: Not on file  Tobacco Use   Smoking status: Former Smoker    Packs/day: 1.00    Years: 15.00    Pack years: 15.00  Types: Cigarettes   Smokeless tobacco: Never Used   Tobacco comment: quit smoking ~ 20 yrs ago.  Substance and Sexual Activity   Alcohol use: No    Alcohol/week: 0.0 standard drinks    Comment: prev drank - none in ~ 25 yrs.   Drug use: No    Comment: prev smoked MJ, none in 25 yrs.   Sexual activity: Yes  Lifestyle   Physical activity    Days per week: Not on file    Minutes per session: Not on file   Stress: Not on file  Relationships   Social connections    Talks on phone: Not on file    Gets together: Not on file    Attends religious service: Not on file    Active member of club or organization: Not on file    Attends meetings of clubs or organizations: Not on file    Relationship status: Not on file   Intimate partner violence    Fear of current or ex partner: Not on file    Emotionally abused: Not on file    Physically abused: Not on file     Forced sexual activity: Not on file  Other Topics Concern   Not on file  Social History Narrative   Pt lives in [place] with his wife and son.  He has 2 step children and another child - all grown and out of the house.    FAMILY HISTORY: Family History  Problem Relation Age of Onset   Other Father        died young in MVA.   Heart attack Mother        alive @ 62.   Other Brother        A & W   Other Sister        A & W    ALLERGIES:  is allergic to lipitor [atorvastatin] and penicillins.  MEDICATIONS:  Current Outpatient Medications  Medication Sig Dispense Refill   acetaminophen (TYLENOL) 325 MG tablet Take 2 tablets (650 mg total) by mouth every 6 (six) hours as needed for mild pain.     aspirin EC 81 MG tablet Take 1 tablet (81 mg total) by mouth daily.     calcium citrate-vitamin D (CITRACAL+D) 315-200 MG-UNIT tablet Take 1 tablet by mouth daily.     carvedilol (COREG) 6.25 MG tablet Take 1 tablet by mouth twice daily 60 tablet 0   Coenzyme Q10 (COQ10) 400 MG CAPS Take 400 mg by mouth 2 (two) times daily.     Flax OIL Take 1 capsule by mouth daily.     Lactobacillus (ULTIMATE PROBIOTIC FORMULA) CAPS Take 1 capsule by mouth daily.      lisinopril (PRINIVIL,ZESTRIL) 5 MG tablet TAKE 1 TABLET BY MOUTH TWICE DAILY (Patient taking differently: Take 5 mg by mouth daily. ) 60 tablet 6   Magnesium 400 MG TABS Take 400 mg by mouth 2 (two) times daily.      Omega-3 Fatty Acids (FISH OIL CONCENTRATE PO) Take 1 capsule by mouth daily.     oxymetazoline (AFRIN) 0.05 % nasal spray Place 1 spray into both nostrils at bedtime as needed for congestion.     spironolactone (ALDACTONE) 25 MG tablet Take 1/2 (one-half) tablet by mouth once daily 45 tablet 1   No current facility-administered medications for this visit.     REVIEW OF SYSTEMS:    A 10+ POINT REVIEW OF SYSTEMS WAS OBTAINED including neurology, dermatology, psychiatry, cardiac,  respiratory, lymph,  extremities, GI, GU, Musculoskeletal, constitutional, breasts, reproductive, HEENT.  All pertinent positives are noted in the HPI.  All others are negative.   PHYSICAL EXAMINATION: ECOG PERFORMANCE STATUS: 1 - Symptomatic but completely ambulatory  There were no vitals filed for this visit. There were no vitals filed for this visit. .There is no height or weight on file to calculate BMI.  Phone Visit  LABORATORY DATA:  I have reviewed the data as listed  . CBC Latest Ref Rng & Units 01/08/2019 01/01/2019 12/25/2018  WBC 4.0 - 10.5 K/uL 5.7 5.4 5.3  Hemoglobin 13.0 - 17.0 g/dL 15.5 15.3 14.9  Hematocrit 39.0 - 52.0 % 44.1 43.6 42.2  Platelets 150 - 400 K/uL 241 238 220    . CMP Latest Ref Rng & Units 01/01/2019 05/01/2018 09/05/2017  Glucose 70 - 99 mg/dL 95 97 99  BUN 8 - 23 mg/dL '12 16 16  ' Creatinine 0.61 - 1.24 mg/dL 1.03 0.94 1.00  Sodium 135 - 145 mmol/L 138 136 135  Potassium 3.5 - 5.1 mmol/L 4.4 4.1 4.1  Chloride 98 - 111 mmol/L 104 106 101  CO2 22 - 32 mmol/L '27 24 24  ' Calcium 8.9 - 10.3 mg/dL 9.4 9.5 9.4  Total Protein 6.5 - 8.1 g/dL 7.5 - -  Total Bilirubin 0.3 - 1.2 mg/dL 0.7 - -  Alkaline Phos 38 - 126 U/L 46 - -  AST 15 - 41 U/L 17 - -  ALT 0 - 44 U/L 16 - -    12/25/18 Left Retroperitoneal Biopsy:    RADIOGRAPHIC STUDIES: I have personally reviewed the radiological images as listed and agreed with the findings in the report. Nm Pet Image Initial (pi) Skull Base To Thigh  Result Date: 12/19/2018 CLINICAL DATA:  Initial treatment strategy for abdominal adenopathy. Intraabdominal/pelvic swelling, mass. EXAM: NUCLEAR MEDICINE PET SKULL BASE TO THIGH TECHNIQUE: 11.2 mCi F-18 FDG was injected intravenously. Full-ring PET imaging was performed from the skull base to thigh after the radiotracer. CT data was obtained and used for attenuation correction and anatomic localization. Fasting blood glucose: 104 mg/dl COMPARISON:  Chest CT of 12/11/2018. FINDINGS: Mediastinal  blood pool activity: SUV max 2.3 Liver activity: SUV max 4.4 NECK: No areas of abnormal hypermetabolism. Incidental CT findings: No cervical adenopathy. CHEST: High right paratracheal node measures 11 mm and a S.U.V. max of 6.3 on image 61/4. A left paratracheal/middle mediastinal node measures 1.0 cm and a S.U.V. max of 6.0 on image 75/4. Incidental CT findings: Deferred to recent diagnostic CT. Aortic valve repair. Median sternotomy. Cardiomegaly. ABDOMEN/PELVIS: A preaortic node measures 2.9 cm and a S.U.V. max of 27.5 on image 137/4. Gastrohepatic ligament node measures 1.2 cm and a S.U.V. max of 5.9 on image 118/4. Minimal extension of hypermetabolic adenopathy within the upper pelvis. A left common iliac node measures 1.0 cm and a S.U.V. max of 5.0 on image 156/4. Incidental CT findings: Abdominal aortic atherosclerosis. Scattered colonic diverticula. Mild prostatomegaly. Small bilateral hydroceles may be physiologic. SKELETON: No abnormal marrow activity. 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 IMPRESSION: 1. Hypermetabolic adenopathy within the chest, abdomen, and upper pelvis, most consistent with a lymphoproliferative process. 2.  Aortic Atherosclerosis (ICD10-I70.0). Electronically Signed   By: Abigail Miyamoto M.D.   On: 12/19/2018 09:31   Ct Biopsy  Result Date: 12/25/2018 INDICATION: 63 year old with chest and abdominal lymphadenopathy of unknown etiology. Concern for a lymphoproliferative process. Tissue sampling is needed. EXAM: CT-GUIDED LEFT RETROPERITONEAL LYMPH NODE  BIOPSY MEDICATIONS: None. ANESTHESIA/SEDATION: Moderate (conscious) sedation was employed during this procedure. A total of Versed 2.0 mg and Fentanyl 75 mcg was administered intravenously. Moderate Sedation Time: 23 minutes. The patient's level of consciousness and vital signs were monitored continuously by radiology nursing throughout the procedure under my direct supervision.  FLUOROSCOPY TIME:  None COMPLICATIONS: None immediate. PROCEDURE: Informed written consent was obtained from the patient after a thorough discussion of the procedural risks, benefits and alternatives. All questions were addressed. A timeout was performed prior to the initiation of the procedure. Patient was placed prone. CT images through the abdomen were obtained. The left periaortic lymph nodes were targeted for biopsy. The left side of the back was prepped with chlorhexidine and a sterile field was created. Skin and soft tissues were anesthetized with 1% lidocaine. Using CT guidance, a 17 gauge coaxial needle was directed into a left periaortic lymph node. Multiple core biopsies were obtained with an 18 gauge core device. Specimens placed in saline. 17 gauge needle was removed without complication. Bandage placed over the puncture site. FINDINGS: Several small periaortic lymph nodes. Largest lymph nodes are just anterior to the aorta. Lymph node just left of the aorta was sampled. Multiple small core biopsies were obtained. IMPRESSION: CT-guided core biopsies of a left retroperitoneal lymph node. Electronically Signed   By: Markus Daft M.D.   On: 12/25/2018 17:23   Ct Angio Chest Aorta W/cm &/or Wo/cm  Result Date: 12/11/2018 CLINICAL DATA:  Status post aortic valve replacement. EXAM: CT ANGIOGRAPHY CHEST WITH CONTRAST TECHNIQUE: Multidetector CT imaging of the chest was performed using the standard protocol during bolus administration of intravenous contrast. Multiplanar CT image reconstructions and MIPs were obtained to evaluate the vascular anatomy. CONTRAST:  75 mL ISOVUE-370 IOPAMIDOL (ISOVUE-370) INJECTION 76% COMPARISON:  CT scan of Nov 16, 2017. FINDINGS: Cardiovascular: Status post aortic valve replacement. Status post surgical repair of ascending thoracic aorta. Status post coronary artery bypass graft. No aneurysm or dissection is noted. Great vessels are widely patent without significant stenosis.  Normal cardiac size. No pericardial effusion. Mediastinum/Nodes: No enlarged mediastinal, hilar, or axillary lymph nodes. Thyroid gland, trachea, and esophagus demonstrate no significant findings. Lungs/Pleura: Lungs are clear. No pleural effusion or pneumothorax. Upper Abdomen: There is noted periaortic adenopathy which appears to be increased in size compared to prior exam. The largest lymph node measures 2.9 cm. Musculoskeletal: No chest wall abnormality. No acute or significant osseous findings. Review of the MIP images confirms the above findings. IMPRESSION: Patient is status post aortic valve replacement with surgical repair of ascending thoracic aorta and coronary artery bypass graft. These are stable findings with no evidence of dissection or aneurysm. However, there appears to be interval development of significantly enlarged periaortic adenopathy in the visualized portion of the upper abdomen. This is concerning for possible lymphoma or other malignancy. CT scan of the abdomen and pelvis with intravenous contrast is recommended for further evaluation. These results will be called to the ordering clinician or representative by the Radiologist Assistant, and communication documented in the PACS or zVision Dashboard. Electronically Signed   By: Marijo Conception M.D.   On: 12/11/2018 15:00   Ir Imaging Guided Port Insertion  Result Date: 01/08/2019 INDICATION: History of diffuse large B-cell lymphoma. In need of durable intravenous access for chemotherapy administration. EXAM: IMPLANTED PORT A CATH PLACEMENT WITH ULTRASOUND AND FLUOROSCOPIC GUIDANCE COMPARISON:  PET CT-12/19/2018 MEDICATIONS: Vancomycin 1.5 IV; The antibiotic was administered within an appropriate time interval prior to skin puncture.  ANESTHESIA/SEDATION: Moderate (conscious) sedation was employed during this procedure. A total of Versed 3 mg and Fentanyl 100 mcg was administered intravenously. Moderate Sedation Time: 27 minutes. The  patient's level of consciousness and vital signs were monitored continuously by radiology nursing throughout the procedure under my direct supervision. CONTRAST:  None FLUOROSCOPY TIME:  18 seconds (12 mGy) COMPLICATIONS: None immediate. PROCEDURE: The procedure, risks, benefits, and alternatives were explained to the patient. Questions regarding the procedure were encouraged and answered. The patient understands and consents to the procedure. The right neck and chest were prepped with chlorhexidine in a sterile fashion, and a sterile drape was applied covering the operative field. Maximum barrier sterile technique with sterile gowns and gloves were used for the procedure. A timeout was performed prior to the initiation of the procedure. Local anesthesia was provided with 1% lidocaine with epinephrine. After creating a small venotomy incision, a micropuncture kit was utilized to access the internal jugular vein. Real-time ultrasound guidance was utilized for vascular access including the acquisition of a permanent ultrasound image documenting patency of the accessed vessel. The microwire was utilized to measure appropriate catheter length. A subcutaneous port pocket was then created along the upper chest wall utilizing a combination of sharp and blunt dissection. The pocket was irrigated with sterile saline. A single lumen ISP power injectable port was chosen for placement. The 8 Fr catheter was tunneled from the port pocket site to the venotomy incision. The port was placed in the pocket. The external catheter was trimmed to appropriate length. At the venotomy, an 8 Fr peel-away sheath was placed over a guidewire under fluoroscopic guidance. The catheter was then placed through the sheath and the sheath was removed. Final catheter positioning was confirmed and documented with a fluoroscopic spot radiograph. The port was accessed with a Huber needle, aspirated and flushed with heparinized saline. The venotomy site  was closed with an interrupted 4-0 Vicryl suture. The port pocket incision was closed with interrupted 2-0 Vicryl suture and the skin was opposed with a running subcuticular 4-0 Vicryl suture. Dermabond and Steri-strips were applied to both incisions. Dressings were placed. The patient tolerated the procedure well without immediate post procedural complication. FINDINGS: After catheter placement, the tip lies within the superior cavoatrial junction. The catheter aspirates and flushes normally and is ready for immediate use. IMPRESSION: Successful placement of a right internal jugular approach power injectable Port-A-Cath. The catheter is ready for immediate use. Electronically Signed   By: Sandi Mariscal M.D.   On: 01/08/2019 15:18    ASSESSMENT & PLAN:  63 y.o. male with  1. Newly diagnosed Diffuse Large B-Cell Lymphoma, Stage III-A Labs upon initial presentation from 12/25/18, blood counts normal including WBC at 5.3k, HGB at 14.9 and PLT at 220k 12/25/18 Left retroperitoneal biopsy revealed Diffuse Large B-Cell Lymphoma 12/19/18 PET/CT revealed "Hypermetabolic adenopathy within the chest, abdomen, and upper pelvis, most consistent with a lymphoproliferative process. 2. Aortic Atherosclerosis."  PLAN:  -Discussed pt labwork today, 01/09/19; blood counts and chemistries are normal. Reassuring that bone marrow is not involved. LDH normal at 179. -01/01/19 Hep B and Hep C negative. -Discussed the 01/04/19 ECHO which revealed LV EF of 40-45%, moderately increased left ventricular wall thickness, impaired relaxation, and moderate hypokinesis of LV, entire inferior wall and apical segment. -Discussed that treatment choice will need to take into account his CHF and decreased LV EF -Discussed that the general treatment recommendation is up to 6 cycles of R-CHOP. However, given his cardiac considerations I would  recommend substituting Etoposide for Doxorubicin for R-CEOP regimen to limit risk of worsening heart  failure. Discussed that this choice can decrease treatment efficacy but would aim to preserve heart function. Other option would be infusion form EPOCH-R which has reduced risk of heart complications but includes Doxorubicin; pt strongly prefers outpatient treatment and to avoid anthracycline in any form, which I also recommend. -Will need to watch fluid status throughout treatment. -Continue follow up with Dr. Loralie Champagne in Cardiology -Will set pt up for chemotherapy counseling session and will send supportive medications to pharmacy -Will aim to begin C1 R-CEOP in 1-2 weeks -Will see the pt back in 7-10 days   Chemotherapy counseling for R-CEOP in 3-4 days Schedule to start R-CEOP in 7-10 days with labs and MD visit   All of the patients questions were answered with apparent satisfaction. The patient knows to call the clinic with any problems, questions or concerns.  The total time spent in the appt was 40 minutes and more than 50% was on counseling and direct patient cares.    Sullivan Lone MD MS AAHIVMS [facility] Hematology/Oncology Physician [facility]  (Office):       [phone] (Work cell):  [phone] (Fax):           [phone]  01/09/2019 10:07 AM  I, Baldwin Jamaica, am acting as a scribe for Dr. Sullivan Lone.   .I have reviewed the above documentation for accuracy and completeness, and I agree with the above. Brunetta Genera MD

## 2019-01-09 ENCOUNTER — Inpatient Hospital Stay: Payer: BC Managed Care – PPO | Attending: Hematology | Admitting: Hematology

## 2019-01-09 ENCOUNTER — Telehealth: Payer: Self-pay | Admitting: Hematology

## 2019-01-09 DIAGNOSIS — C8338 Diffuse large B-cell lymphoma, lymph nodes of multiple sites: Secondary | ICD-10-CM | POA: Diagnosis not present

## 2019-01-09 DIAGNOSIS — I255 Ischemic cardiomyopathy: Secondary | ICD-10-CM

## 2019-01-09 DIAGNOSIS — I700 Atherosclerosis of aorta: Secondary | ICD-10-CM | POA: Insufficient documentation

## 2019-01-09 DIAGNOSIS — K30 Functional dyspepsia: Secondary | ICD-10-CM | POA: Insufficient documentation

## 2019-01-09 DIAGNOSIS — Z7189 Other specified counseling: Secondary | ICD-10-CM

## 2019-01-09 DIAGNOSIS — Z87891 Personal history of nicotine dependence: Secondary | ICD-10-CM | POA: Insufficient documentation

## 2019-01-09 DIAGNOSIS — Z806 Family history of leukemia: Secondary | ICD-10-CM | POA: Insufficient documentation

## 2019-01-09 DIAGNOSIS — Z5189 Encounter for other specified aftercare: Secondary | ICD-10-CM | POA: Insufficient documentation

## 2019-01-09 NOTE — Telephone Encounter (Signed)
Scheduled appt per 7/1 los. Spoke with patient and he is aware of his appt date and time.

## 2019-01-10 DIAGNOSIS — Z7189 Other specified counseling: Secondary | ICD-10-CM | POA: Insufficient documentation

## 2019-01-10 DIAGNOSIS — C8338 Diffuse large B-cell lymphoma, lymph nodes of multiple sites: Secondary | ICD-10-CM | POA: Insufficient documentation

## 2019-01-10 NOTE — Progress Notes (Signed)
START ON PATHWAY REGIMEN - Lymphoma and CLL     A cycle is every 21 days:     Rituximab-xxxx      Cyclophosphamide      Vincristine      Etoposide      Prednisone   **Always confirm dose/schedule in your pharmacy ordering system**  Patient Characteristics: Diffuse Large B-Cell Lymphoma or Follicular Lymphoma, Grade 3B, First Line, Stage III and IV Disease Type: Not Applicable Disease Type: Diffuse Large B-Cell Lymphoma Disease Type: Not Applicable Line of therapy: First Line [place] Stage: III Intent of Therapy: Curative Intent, Discussed with Patient

## 2019-01-15 ENCOUNTER — Inpatient Hospital Stay: Payer: BC Managed Care – PPO

## 2019-01-15 ENCOUNTER — Telehealth: Payer: Self-pay | Admitting: *Deleted

## 2019-01-16 ENCOUNTER — Telehealth: Payer: Self-pay | Admitting: *Deleted

## 2019-01-16 ENCOUNTER — Other Ambulatory Visit: Payer: Self-pay | Admitting: Hematology

## 2019-01-16 DIAGNOSIS — Z7189 Other specified counseling: Secondary | ICD-10-CM

## 2019-01-16 DIAGNOSIS — C8338 Diffuse large B-cell lymphoma, lymph nodes of multiple sites: Secondary | ICD-10-CM

## 2019-01-16 MED ORDER — ONDANSETRON HCL 8 MG PO TABS: 8.0000 mg | ORAL_TABLET | Freq: Two times a day (BID) | ORAL | 1 refills | Status: AC | PRN

## 2019-01-16 MED ORDER — PREDNISONE 20 MG PO TABS: 60.0000 mg | ORAL_TABLET | Freq: Every day | ORAL | 5 refills | Status: DC

## 2019-01-16 MED ORDER — LORAZEPAM 0.5 MG PO TABS: 0.5000 mg | ORAL_TABLET | Freq: Four times a day (QID) | ORAL | 0 refills | Status: DC | PRN

## 2019-01-16 MED ORDER — PROCHLORPERAZINE MALEATE 10 MG PO TABS: 10.0000 mg | ORAL_TABLET | Freq: Four times a day (QID) | ORAL | 1 refills | Status: DC | PRN

## 2019-01-16 MED ORDER — ALLOPURINOL 300 MG PO TABS: 150.0000 mg | ORAL_TABLET | Freq: Every day | ORAL | 0 refills | Status: DC

## 2019-01-16 MED ORDER — LIDOCAINE-PRILOCAINE 2.5-2.5 % EX CREA: TOPICAL_CREAM | CUTANEOUS | 3 refills | Status: DC

## 2019-01-16 NOTE — Telephone Encounter (Signed)
Contacted patient to inform him of schedule changes. Dr. Irene Limbo communicated  to scheduling that patient treatment is 3 days - first day is RCEOP and Etoposide the next 2 days with udenyca injection. New treatment schedule created for patient. Schedule changes and times reviewed with patient and patient verbalized understanding. Reviewed patient medications prednisone, allopurinol, compazine and EMLA creme with patient. Patient verbalized understanding.

## 2019-01-17 ENCOUNTER — Encounter: Payer: Self-pay | Admitting: Hematology

## 2019-01-17 NOTE — Progress Notes (Signed)
HEMATOLOGY/ONCOLOGY CLINIC NOTE  Date of Service: 01/18/2019  Patient Care Team: Angelina Sheriff, MD as PCP - General (Family Medicine)  CHIEF COMPLAINTS/PURPOSE OF CONSULTATION:  Newly diagnosed Diffuse Large B-Cell Lymphoma  HISTORY OF PRESENTING ILLNESS:   William Contreras is a wonderful 63 y.o. male who has been referred to [place] by Dr. Lanelle Bal in Cardiothoracic surgery for evaluation and management of his Newly diagnosed Diffuse Large B-Cell Lymphoma. The pt reports that he is doing well overall.   The pt reports that he has had a history of CABG x2 alongside replacing his aortic valve in 2018. His last ECHO was in November 2019 which revealed an LV EF of 45%. He was seen to have enlarged lymph nodes incidentally on a follow up CT with his cardiothoracic surgeon in May 2020. He denies fevers, chills, night sweats, unexpected weight loss. He has not noticed any new lumps or bumps and denies feeling differently presently as compared to 6 months ago. He denies any limiting symptoms when walking or with mild exercise. He denies any CHF exacerbations. The pt also notes a history of HLD and notes that he took statins for a little while which cause numbness and tingling in his toes and made him "feel mentally affected." He notes that he has well controlled HTN and endorses much improved GERD.  The pt notes that he does not have sleep apnea, there was once concern, but he lost weight to address this concern.  Of note prior to the patient's visit today, pt has had a PET/CT completed on 12/19/18 with results revealing "Hypermetabolic adenopathy within the chest, abdomen, and upper pelvis, most consistent with a lymphoproliferative process. 2. Aortic Atherosclerosis."   Most recent lab results (12/25/18) of CBC is as follows: all values are WNL.  On review of systems, pt reports stable energy levels, stable weight, and denies fevers, chills, night sweats, unexpected weight loss, new  lumps or bumps, difficulty breathing, SOB, CP, problems swallowing, dental concerns, painful teeth, recent infections, pain along the spine, abdominal pains, leg swelling, and any other symptoms.   On PMHx the pt reports s/p replacement of ascending aorta and aortic valve in 2018, HLD, HTN, CHF. On Social Hx the pt reports that he quit smoking cigarettes more than 20 years ago and denies any alcohol consumption. Works as a Environmental education officer. On Family Hx the pt reports brother with unspecified leukemia, father killed when pt was 35 years old. Mother well and living in 70s without medical concerns.  Interval History:   William Contreras returns today for management and evaluation of his recently diagnosed Diffuse Large B-Cell Lymphoma. The patient's last visit with [place] was on 01/09/19. The pt reports that he is doing well overall.  The pt reports that he has been having some indigestion after eating, which he describes as "a raw feeling." He has taken Peptobismol occasionally. He was able to pick up his supportive medications from his pharmacy. The pt endorses stable energy levels and denies any abdominal pains as such, and also denies leg swelling.  Lab results today (01/18/19) of CBC w/diff and CMP is as follows: all values are WNL except for RDW at 11.2.  On review of systems, pt reports recent indigestion, stable energy levels, and denies abdominal pains, leg swelling, and any other symptoms.   MEDICAL HISTORY:  Past Medical History:  Diagnosis Date   CHF (congestive heart failure) ([place])    Coronary artery disease    Essential hypertension  GERD (gastroesophageal reflux disease)    Heart murmur    Morbid obesity ([place])    Neuromuscular disorder ([place])    burning in both feet - pt. thinks its related to statin    Sleep apnea    told that he was borderline, but he has lost 50 lbs & is feeling much better     SURGICAL HISTORY: Past Surgical History:  Procedure Laterality Date   AORTIC  VALVE REPLACEMENT N/A 03/14/2017   Procedure: AORTIC VALVE REPLACEMENT (AVR);  Surgeon: Grace Isaac, MD;  Location: [facility];  Service: Open Heart Surgery;  Laterality: N/A;  Using 80m Edwards Magna Ease Bioprosthetic Aortic Valve   CARDIAC CATHETERIZATION N/A 03/10/2015   Procedure: Right/Left Heart Cath and Coronary Angiography;  Surgeon: DLarey Dresser MD;  [facility];  Service: Cardiovascular;  Laterality: N/A;   CARDIAC CATHETERIZATION N/A 03/10/2015   Procedure: Coronary Stent Intervention;  Surgeon: DLeonie Man MD;  [facility];  Service: Cardiovascular;  Laterality: N/A;   CORONARY ARTERY BYPASS GRAFT N/A 03/14/2017   Procedure: CORONARY ARTERY BYPASS GRAFTING (CABG), ON PUMP, TIMES TWO, USING LEFT INTERNAL MAMMARY ARTERY AND ENDOSCOPICALLY HARVESTED LEFT GREATER SAPHENOUS VEIN;  Surgeon: GGrace Isaac MD;  [facility]  Service: Open Heart Surgery;  Laterality: N/A;  LIMA to LAD SVG to RAMUS INTERMEDIATE   IR IMAGING GUIDED PORT INSERTION  01/08/2019   REPLACEMENT ASCENDING AORTA N/A 03/14/2017   Procedure: REPLACEMENT ASCENDING AORTA WITH WHEAT PROCEDURE;  Surgeon: GGrace Isaac MD;  [facility]  Service: Open Heart Surgery;  Laterality: N/A;  Using 374mHemashield Platinum Woven Double Velour Vascular Graft   TEE WITHOUT CARDIOVERSION N/A 03/14/2017   Procedure: TRANSESOPHAGEAL ECHOCARDIOGRAM (TEE);  Surgeon: GeGrace IsaacMD;  [facility] Service: Open Heart Surgery;  Laterality: N/A;   TONSILLECTOMY      SOCIAL HISTORY: Social History   Socioeconomic History   Marital status: Married    Spouse name: Not on file   Number of children: Not on file   Years of education: Not on file   Highest education level: Not on file  Occupational History   Not on file  Social Needs   Financial resource strain: Not on file   Food insecurity    Worry: Not on file    Inability: Not on file   Transportation  needs    Medical: Not on file    Non-medical: Not on file  Tobacco Use   Smoking status: Former Smoker    Packs/day: 1.00    Years: 15.00    Pack years: 15.00    Types: Cigarettes   Smokeless tobacco: Never Used   Tobacco comment: quit smoking ~ 20 yrs ago.  Substance and Sexual Activity   Alcohol use: No    Alcohol/week: 0.0 standard drinks    Comment: prev drank - none in ~ 25 yrs.   Drug use: No    Comment: prev smoked MJ, none in 25 yrs.   Sexual activity: Yes  Lifestyle   Physical activity    Days per week: Not on file    Minutes per session: Not on file   Stress: Not on file  Relationships   Social connections    Talks on phone: Not on file    Gets together: Not on file    Attends religious service: Not on file    Active member of club or organization: Not on file  Attends meetings of clubs or organizations: Not on file    Relationship status: Not on file   Intimate partner violence    Fear of current or ex partner: Not on file    Emotionally abused: Not on file    Physically abused: Not on file    Forced sexual activity: Not on file  Other Topics Concern   Not on file  Social History Narrative   Pt lives in [place] with his wife and son.  He has 2 step children and another child - all grown and out of the house.    FAMILY HISTORY: Family History  Problem Relation Age of Onset   Other Father        died young in MVA.   Heart attack Mother        alive @ 77.   Other Brother        A & W   Other Sister        A & W    ALLERGIES:  is allergic to lipitor [atorvastatin] and penicillins.  MEDICATIONS:  Current Outpatient Medications  Medication Sig Dispense Refill   acetaminophen (TYLENOL) 325 MG tablet Take 2 tablets (650 mg total) by mouth every 6 (six) hours as needed for mild pain.     allopurinol (ZYLOPRIM) 300 MG tablet Take 0.5 tablets (150 mg total) by mouth daily. 30 tablet 0   aspirin EC 81 MG tablet Take 1 tablet (81 mg  total) by mouth daily.     calcium citrate-vitamin D (CITRACAL+D) 315-200 MG-UNIT tablet Take 1 tablet by mouth daily.     carvedilol (COREG) 6.25 MG tablet Take 1 tablet by mouth twice daily 60 tablet 0   Coenzyme Q10 (COQ10) 400 MG CAPS Take 400 mg by mouth 2 (two) times daily.     Flax OIL Take 1 capsule by mouth daily.     Lactobacillus (ULTIMATE PROBIOTIC FORMULA) CAPS Take 1 capsule by mouth daily.      lidocaine-prilocaine (EMLA) cream Apply to affected area once 30 g 3   lisinopril (PRINIVIL,ZESTRIL) 5 MG tablet TAKE 1 TABLET BY MOUTH TWICE DAILY (Patient taking differently: Take 5 mg by mouth daily. ) 60 tablet 6   LORazepam (ATIVAN) 0.5 MG tablet Take 1 tablet (0.5 mg total) by mouth every 6 (six) hours as needed (Nausea or vomiting). 30 tablet 0   Magnesium 400 MG TABS Take 400 mg by mouth 2 (two) times daily.      Omega-3 Fatty Acids (FISH OIL CONCENTRATE PO) Take 1 capsule by mouth daily.     ondansetron (ZOFRAN) 8 MG tablet Take 1 tablet (8 mg total) by mouth 2 (two) times daily as needed for refractory nausea / vomiting. Start on day 3 after cyclophosphamide. 30 tablet 1   oxymetazoline (AFRIN) 0.05 % nasal spray Place 1 spray into both nostrils at bedtime as needed for congestion.     predniSONE (DELTASONE) 20 MG tablet Take 3 tablets (60 mg total) by mouth daily. Take on days 1-5 of chemotherapy. 15 tablet 5   prochlorperazine (COMPAZINE) 10 MG tablet Take 1 tablet (10 mg total) by mouth every 6 (six) hours as needed (Nausea or vomiting). 30 tablet 1   spironolactone (ALDACTONE) 25 MG tablet Take 1/2 (one-half) tablet by mouth once daily 45 tablet 1   No current facility-administered medications for this visit.     REVIEW OF SYSTEMS:    A 10+ POINT REVIEW OF SYSTEMS WAS OBTAINED including neurology, dermatology, psychiatry,  cardiac, respiratory, lymph, extremities, GI, GU, Musculoskeletal, constitutional, breasts, reproductive, HEENT.  All pertinent positives are  noted in the HPI.  All others are negative.  PHYSICAL EXAMINATION: ECOG PERFORMANCE STATUS: 1 - Symptomatic but completely ambulatory  Vitals:   01/18/19 0909  BP: (!) 153/87  Pulse: (!) 55  Resp: 18  Temp: 98.7 F (37.1 C)  SpO2: 99%   Filed Weights   01/18/19 0909  Weight: 228 lb 8 oz (103.6 kg)   .Body mass index is 32.79 kg/m.  GENERAL:alert, in no acute distress and comfortable SKIN: no acute rashes, no significant lesions EYES: conjunctiva are pink and non-injected, sclera anicteric OROPHARYNX: MMM, no exudates, no oropharyngeal erythema or ulceration NECK: supple, no JVD LYMPH: no palpable lymphadenopathy in the cervical, axillary or inguinal regions LUNGS: clear to auscultation b/l with normal respiratory effort HEART: regular rate & rhythm ABDOMEN:  normoactive bowel sounds , non tender, not distended. No palpable hepatosplenomegaly.  Extremity: no pedal edema PSYCH: alert & oriented x 3 with fluent speech NEURO: no focal motor/sensory deficits   LABORATORY DATA:  I have reviewed the data as listed  . CBC Latest Ref Rng & Units 01/18/2019 01/08/2019 01/01/2019  WBC 4.0 - 10.5 K/uL 4.5 5.7 5.4  Hemoglobin 13.0 - 17.0 g/dL 15.0 15.5 15.3  Hematocrit 39.0 - 52.0 % 41.8 44.1 43.6  Platelets 150 - 400 K/uL 218 241 238    . CMP Latest Ref Rng & Units 01/18/2019 01/01/2019 05/01/2018  Glucose 70 - 99 mg/dL 96 95 97  BUN 8 - 23 mg/dL _0 Creatinine 0.61 - 1.24 mg/dL 0.93 1.03 0.94  Sodium 135 - 145 mmol/L 140 138 136  Potassium 3.5 - 5.1 mmol/L 4.1 4.4 4.1  Chloride 98 - 111 mmol/L 105 104 106  CO2 22 - 32 mmol/L _1 Calcium 8.9 - 10.3 mg/dL 9.1 9.4 9.5  Total Protein 6.5 - 8.1 g/dL 7.2 7.5 -  Total Bilirubin 0.3 - 1.2 mg/dL 0.5 0.7 -  Alkaline Phos 38 - 126 U/L 46 46 -  AST 15 - 41 U/L 19 17 -  ALT 0 - 44 U/L 17 16 -    12/25/18 Left Retroperitoneal Biopsy:    RADIOGRAPHIC STUDIES: I have personally reviewed the radiological images as listed  and agreed with the findings in the report. Ct Biopsy  Result Date: 12/25/2018 INDICATION: 63 year old with chest and abdominal lymphadenopathy of unknown etiology. Concern for a lymphoproliferative process. Tissue sampling is needed. EXAM: CT-GUIDED LEFT RETROPERITONEAL LYMPH NODE BIOPSY MEDICATIONS: None. ANESTHESIA/SEDATION: Moderate (conscious) sedation was employed during this procedure. A total of Versed 2.0 mg and Fentanyl 75 mcg was administered intravenously. Moderate Sedation Time: 23 minutes. The patient's level of consciousness and vital signs were monitored continuously by radiology nursing throughout the procedure under my direct supervision. FLUOROSCOPY TIME:  None COMPLICATIONS: None immediate. PROCEDURE: Informed written consent was obtained from the patient after a thorough discussion of the procedural risks, benefits and alternatives. All questions were addressed. A timeout was performed prior to the initiation of the procedure. Patient was placed prone. CT images through the abdomen were obtained. The left periaortic lymph nodes were targeted for biopsy. The left side of the back was prepped with chlorhexidine and a sterile field was created. Skin and soft tissues were anesthetized with 1% lidocaine. Using CT guidance, a 17 gauge coaxial needle was directed into a left periaortic lymph node. Multiple core biopsies were obtained with an 18 gauge core device. Specimens  placed in saline. 17 gauge needle was removed without complication. Bandage placed over the puncture site. FINDINGS: Several small periaortic lymph nodes. Largest lymph nodes are just anterior to the aorta. Lymph node just left of the aorta was sampled. Multiple small core biopsies were obtained. IMPRESSION: CT-guided core biopsies of a left retroperitoneal lymph node. Electronically Signed   By: Markus Daft M.D.   On: 12/25/2018 17:23   Ir Imaging Guided Port Insertion  Result Date: 01/08/2019 INDICATION: History of diffuse  large B-cell lymphoma. In need of durable intravenous access for chemotherapy administration. EXAM: IMPLANTED PORT A CATH PLACEMENT WITH ULTRASOUND AND FLUOROSCOPIC GUIDANCE COMPARISON:  PET CT-12/19/2018 MEDICATIONS: Vancomycin 1.5 IV; The antibiotic was administered within an appropriate time interval prior to skin puncture. ANESTHESIA/SEDATION: Moderate (conscious) sedation was employed during this procedure. A total of Versed 3 mg and Fentanyl 100 mcg was administered intravenously. Moderate Sedation Time: 27 minutes. The patient's level of consciousness and vital signs were monitored continuously by radiology nursing throughout the procedure under my direct supervision. CONTRAST:  None FLUOROSCOPY TIME:  18 seconds (12 mGy) COMPLICATIONS: None immediate. PROCEDURE: The procedure, risks, benefits, and alternatives were explained to the patient. Questions regarding the procedure were encouraged and answered. The patient understands and consents to the procedure. The right neck and chest were prepped with chlorhexidine in a sterile fashion, and a sterile drape was applied covering the operative field. Maximum barrier sterile technique with sterile gowns and gloves were used for the procedure. A timeout was performed prior to the initiation of the procedure. Local anesthesia was provided with 1% lidocaine with epinephrine. After creating a small venotomy incision, a micropuncture kit was utilized to access the internal jugular vein. Real-time ultrasound guidance was utilized for vascular access including the acquisition of a permanent ultrasound image documenting patency of the accessed vessel. The microwire was utilized to measure appropriate catheter length. A subcutaneous port pocket was then created along the upper chest wall utilizing a combination of sharp and blunt dissection. The pocket was irrigated with sterile saline. A single lumen ISP power injectable port was chosen for placement. The 8 Fr catheter  was tunneled from the port pocket site to the venotomy incision. The port was placed in the pocket. The external catheter was trimmed to appropriate length. At the venotomy, an 8 Fr peel-away sheath was placed over a guidewire under fluoroscopic guidance. The catheter was then placed through the sheath and the sheath was removed. Final catheter positioning was confirmed and documented with a fluoroscopic spot radiograph. The port was accessed with a Huber needle, aspirated and flushed with heparinized saline. The venotomy site was closed with an interrupted 4-0 Vicryl suture. The port pocket incision was closed with interrupted 2-0 Vicryl suture and the skin was opposed with a running subcuticular 4-0 Vicryl suture. Dermabond and Steri-strips were applied to both incisions. Dressings were placed. The patient tolerated the procedure well without immediate post procedural complication. FINDINGS: After catheter placement, the tip lies within the superior cavoatrial junction. The catheter aspirates and flushes normally and is ready for immediate use. IMPRESSION: Successful placement of a right internal jugular approach power injectable Port-A-Cath. The catheter is ready for immediate use. Electronically Signed   By: Sandi Mariscal M.D.   On: 01/08/2019 15:18    ASSESSMENT & PLAN:  63 y.o. male with  1. Newly diagnosed Diffuse Large B-Cell Lymphoma, Stage III-A Labs upon initial presentation from 12/25/18, blood counts normal including WBC at 5.3k, HGB at 14.9 and PLT  at 220k 12/25/18 Left retroperitoneal biopsy revealed Diffuse Large B-Cell Lymphoma 12/19/18 PET/CT revealed "Hypermetabolic adenopathy within the chest, abdomen, and upper pelvis, most consistent with a lymphoproliferative process. 2. Aortic Atherosclerosis." 01/01/19 Hep B and Hep C negative  01/04/19 ECHO revealed LV EF of 40-45%, moderately increased left ventricular wall thickness, impaired relaxation, and moderate hypokinesis of LV, entire inferior  wall and apical segment. Discussed that the general treatment recommendation is up to 6 cycles of R-CHOP. However, given his cardiac considerations I would recommend substituting Etoposide for Doxorubicin for R-CEOP regimen to limit risk of worsening heart failure. Discussed that this choice can decrease treatment efficacy but would aim to preserve heart function. Pt strongly prefers outpatient treatment and to avoid anthracycline in any form, which I also recommend.   PLAN:  -Discussed pt labwork today, 01/18/19; blood counts and chemistries are normal -Will order Protonix for indigestion -Continue Allopurinol through C1 to prevent tumor lysis syndrome -The pt has no prohibitive toxicities from beginning C1 R-CEOP with G-CSF support on 01/22/19, at this time. -Will aim to repeat PET/CT after completing C3 and discussed again that I would recommend treating up to 6 cycles if well tolerated -Advised again the recommendation to avoid contact with individuals with infections, crowd avoidance, wearing a mask, and other infection prevention strategies -Recommended that the pt continue to eat well, drink at least 48-64 oz of water each day, and walk 20-30 minutes each day.  -Will need to watch fluid status throughout treatment. -Continue follow up with Dr. Loralie Champagne in Cardiology -Will see the pt back in 2 weeks   RTC with Dr Irene Limbo with labs in 2 weeks for toxicity check Plz schedule C2 of RCEOP with labs and MD visit   All of the patients questions were answered with apparent satisfaction. The patient knows to call the clinic with any problems, questions or concerns.  The total time spent in the appt was 15 minutes and more than 50% was on counseling and direct patient cares.    Sullivan Lone MD MS AAHIVMS [facility] Hematology/Oncology Physician [facility]  (Office):       [phone] (Work cell):  [phone] (Fax):           [phone]  01/18/2019 10:00 AM  I, Baldwin Jamaica, am acting as a scribe for Dr. Sullivan Lone.   .I have reviewed the above documentation for accuracy and completeness, and I agree with the above. Brunetta Genera MD

## 2019-01-17 NOTE — Progress Notes (Signed)
Called patient and left voicemail to introduce myself as Arboriculturist and ask about financial questions or concerns.  Will discuss one-time $33 [place] and qualifications to assist with personal expenses while going through treatment such as gas cards;etc. Left my contact information.

## 2019-01-18 ENCOUNTER — Telehealth: Payer: Self-pay | Admitting: Hematology

## 2019-01-18 ENCOUNTER — Other Ambulatory Visit: Payer: Self-pay

## 2019-01-18 ENCOUNTER — Inpatient Hospital Stay: Payer: BC Managed Care – PPO

## 2019-01-18 ENCOUNTER — Inpatient Hospital Stay (HOSPITAL_BASED_OUTPATIENT_CLINIC_OR_DEPARTMENT_OTHER): Payer: BC Managed Care – PPO | Admitting: Hematology

## 2019-01-18 VITALS — BP 153/87 | HR 55 | Temp 98.7°F | Resp 18 | Ht 70.0 in | Wt 228.5 lb

## 2019-01-18 DIAGNOSIS — C8338 Diffuse large B-cell lymphoma, lymph nodes of multiple sites: Secondary | ICD-10-CM | POA: Diagnosis present

## 2019-01-18 DIAGNOSIS — Z806 Family history of leukemia: Secondary | ICD-10-CM | POA: Diagnosis not present

## 2019-01-18 DIAGNOSIS — Z87891 Personal history of nicotine dependence: Secondary | ICD-10-CM | POA: Diagnosis not present

## 2019-01-18 DIAGNOSIS — I700 Atherosclerosis of aorta: Secondary | ICD-10-CM | POA: Diagnosis not present

## 2019-01-18 DIAGNOSIS — K30 Functional dyspepsia: Secondary | ICD-10-CM

## 2019-01-18 DIAGNOSIS — Z95828 Presence of other vascular implants and grafts: Secondary | ICD-10-CM

## 2019-01-18 DIAGNOSIS — Z5189 Encounter for other specified aftercare: Secondary | ICD-10-CM | POA: Diagnosis not present

## 2019-01-18 LAB — CBC WITH DIFFERENTIAL/PLATELET
Abs Immature Granulocytes: 0.01 10*3/uL (ref 0.00–0.07)
Basophils Absolute: 0 10*3/uL (ref 0.0–0.1)
Basophils Relative: 1 %
Eosinophils Absolute: 0.1 10*3/uL (ref 0.0–0.5)
Eosinophils Relative: 2 %
HCT: 41.8 % (ref 39.0–52.0)
Hemoglobin: 15 g/dL (ref 13.0–17.0)
Immature Granulocytes: 0 %
Lymphocytes Relative: 32 %
Lymphs Abs: 1.5 10*3/uL (ref 0.7–4.0)
MCH: 32.5 pg (ref 26.0–34.0)
MCHC: 35.9 g/dL (ref 30.0–36.0)
MCV: 90.7 fL (ref 80.0–100.0)
Monocytes Absolute: 0.4 10*3/uL (ref 0.1–1.0)
Monocytes Relative: 9 %
Neutro Abs: 2.5 10*3/uL (ref 1.7–7.7)
Neutrophils Relative %: 56 %
Platelets: 218 10*3/uL (ref 150–400)
RBC: 4.61 MIL/uL (ref 4.22–5.81)
RDW: 11.2 % — ABNORMAL LOW (ref 11.5–15.5)
WBC: 4.5 10*3/uL (ref 4.0–10.5)
nRBC: 0 % (ref 0.0–0.2)

## 2019-01-18 LAB — CMP (CANCER CENTER ONLY)
ALT: 17 U/L (ref 0–44)
AST: 19 U/L (ref 15–41)
Albumin: 3.8 g/dL (ref 3.5–5.0)
Alkaline Phosphatase: 46 U/L (ref 38–126)
Anion gap: 9 (ref 5–15)
BUN: 18 mg/dL (ref 8–23)
CO2: 26 mmol/L (ref 22–32)
Calcium: 9.1 mg/dL (ref 8.9–10.3)
Chloride: 105 mmol/L (ref 98–111)
Creatinine: 0.93 mg/dL (ref 0.61–1.24)
GFR, Est AFR Am: >60 mL/min (ref >60)
GFR, Estimated: >60 mL/min (ref >60)
Glucose, Bld: 96 mg/dL (ref 70–99)
Potassium: 4.1 mmol/L (ref 3.5–5.1)
Sodium: 140 mmol/L (ref 135–145)
Total Bilirubin: 0.5 mg/dL (ref 0.3–1.2)
Total Protein: 7.2 g/dL (ref 6.5–8.1)

## 2019-01-18 MED ORDER — HEPARIN SOD (PORK) LOCK FLUSH 100 UNIT/ML IV SOLN
500.0000 [IU] | Freq: Once | INTRAVENOUS | Status: AC
  Administered 2019-01-18: 500 [IU] via INTRAVENOUS
  Filled 2019-01-18: qty 5

## 2019-01-18 MED ORDER — SODIUM CHLORIDE 0.9% FLUSH
10.0000 mL | Freq: Once | INTRAVENOUS | Status: AC
  Administered 2019-01-18: 10 mL via INTRAVENOUS
  Filled 2019-01-18: qty 10

## 2019-01-18 NOTE — Telephone Encounter (Signed)
Scheduled appt per 7/10 los.

## 2019-01-19 ENCOUNTER — Ambulatory Visit: Payer: BC Managed Care – PPO

## 2019-01-21 NOTE — Progress Notes (Signed)
Rapid Infusion Rituximab Pharmacist Evaluation  Patients may be eligible for Rapid Infusion Rituximab (RIR) if they have no significant cardiac disease, no risk for Tumor Lysis Syndrome (TLS), received rituximab within the last 6 months, and tolerated those infusions per standard protocol without grade 3-4 infusion reactions. A pharmacist has verified the patient tolerated rituximab infusions per the [facility] standard infusion protocol without grade 3-4 infusion reactions. The treatment plan will be updated to reflect RIR if the patient qualifies per the checklist below.   William Contreras is a 63 y.o. male being treated with rituximab for DLBCL. This patient may be considered for RIR.    Age > 39 years old Yes   Stable renal, hepatic, and hematologic function Yes   Recent Pertinent Lab Values  Lab Results  Component Value Date   CREATININE 0.93 01/18/2019   BILITOT 0.5 01/18/2019   Lab Results  Component Value Date   WBC 4.5 01/18/2019   LYMPHSABS 1.5 01/18/2019   PLT 218 01/18/2019     Prior documented reaction to rituximab No   Previous rituximab infusion within 6 months No   Physician approval of RIR   Treatment Plan updated orders to reflect RIR    Will montior Cycle 1 tolerance of RTX and address w/ MD for order changes if appropriate for RIR.  Kennith Center P 01/21/19 9:41 AM

## 2019-01-22 ENCOUNTER — Other Ambulatory Visit: Payer: Self-pay

## 2019-01-22 ENCOUNTER — Inpatient Hospital Stay: Payer: BC Managed Care – PPO

## 2019-01-22 ENCOUNTER — Other Ambulatory Visit: Payer: Self-pay | Admitting: Hematology

## 2019-01-22 VITALS — BP 110/80 | HR 79 | Temp 98.6°F | Resp 16 | Ht 70.0 in | Wt 228.5 lb

## 2019-01-22 DIAGNOSIS — C8338 Diffuse large B-cell lymphoma, lymph nodes of multiple sites: Secondary | ICD-10-CM

## 2019-01-22 DIAGNOSIS — Z7189 Other specified counseling: Secondary | ICD-10-CM

## 2019-01-22 MED ORDER — DIPHENHYDRAMINE HCL 25 MG PO CAPS
50.0000 mg | ORAL_CAPSULE | Freq: Once | ORAL | Status: AC
  Administered 2019-01-22: 50 mg via ORAL

## 2019-01-22 MED ORDER — SODIUM CHLORIDE 0.9 % IV SOLN
Freq: Once | INTRAVENOUS | Status: AC
  Administered 2019-01-22: 08:00:00 via INTRAVENOUS
  Filled 2019-01-22: qty 250

## 2019-01-22 MED ORDER — VINCRISTINE SULFATE CHEMO INJECTION 1 MG/ML
2.0000 mg | Freq: Once | INTRAVENOUS | Status: AC
  Administered 2019-01-22: 2 mg via INTRAVENOUS
  Filled 2019-01-22: qty 2

## 2019-01-22 MED ORDER — HEPARIN SOD (PORK) LOCK FLUSH 100 UNIT/ML IV SOLN
500.0000 [IU] | Freq: Once | INTRAVENOUS | Status: AC | PRN
  Administered 2019-01-22: 500 [IU]
  Filled 2019-01-22: qty 5

## 2019-01-22 MED ORDER — ACETAMINOPHEN 325 MG PO TABS
650.0000 mg | ORAL_TABLET | Freq: Once | ORAL | Status: AC
  Administered 2019-01-22: 650 mg via ORAL

## 2019-01-22 MED ORDER — SODIUM CHLORIDE 0.9 % IV SOLN
100.0000 mg/m2 | Freq: Once | INTRAVENOUS | Status: AC
  Administered 2019-01-22: 11:00:00 220 mg via INTRAVENOUS
  Filled 2019-01-22: qty 11

## 2019-01-22 MED ORDER — SODIUM CHLORIDE 0.9 % IV SOLN
375.0000 mg/m2 | Freq: Once | INTRAVENOUS | Status: AC
  Administered 2019-01-22: 800 mg via INTRAVENOUS
  Filled 2019-01-22: qty 50

## 2019-01-22 MED ORDER — DEXAMETHASONE SODIUM PHOSPHATE 10 MG/ML IJ SOLN
10.0000 mg | Freq: Once | INTRAMUSCULAR | Status: AC
  Administered 2019-01-22: 10 mg via INTRAVENOUS

## 2019-01-22 MED ORDER — DIPHENHYDRAMINE HCL 25 MG PO CAPS
ORAL_CAPSULE | ORAL | Status: AC
  Filled 2019-01-22: qty 2

## 2019-01-22 MED ORDER — SODIUM CHLORIDE 0.9 % IV SOLN
750.0000 mg/m2 | Freq: Once | INTRAVENOUS | Status: AC
  Administered 2019-01-22: 1660 mg via INTRAVENOUS
  Filled 2019-01-22: qty 83

## 2019-01-22 MED ORDER — PALONOSETRON HCL INJECTION 0.25 MG/5ML
INTRAVENOUS | Status: AC
  Filled 2019-01-22: qty 5

## 2019-01-22 MED ORDER — ACETAMINOPHEN 325 MG PO TABS
ORAL_TABLET | ORAL | Status: AC
  Filled 2019-01-22: qty 2

## 2019-01-22 MED ORDER — PALONOSETRON HCL INJECTION 0.25 MG/5ML
0.2500 mg | Freq: Once | INTRAVENOUS | Status: AC
  Administered 2019-01-22: 0.25 mg via INTRAVENOUS

## 2019-01-22 MED ORDER — DEXAMETHASONE SODIUM PHOSPHATE 10 MG/ML IJ SOLN
INTRAMUSCULAR | Status: AC
  Filled 2019-01-22: qty 1

## 2019-01-22 MED ORDER — PANTOPRAZOLE SODIUM 40 MG PO TBEC: 40.0000 mg | DELAYED_RELEASE_TABLET | Freq: Every day | ORAL | 1 refills | Status: AC

## 2019-01-22 MED ORDER — SODIUM CHLORIDE 0.9% FLUSH
10.0000 mL | INTRAVENOUS | Status: DC | PRN
  Administered 2019-01-22: 10 mL
  Filled 2019-01-22: qty 10

## 2019-01-22 NOTE — Patient Instructions (Addendum)
[facility] Discharge Instructions for Patients Receiving Chemotherapy  Today you received the following chemotherapy agents:  Vincristine, Cytoxan, Etoposide, Rituxan  To help prevent nausea and vomiting after your treatment, we encourage you to take your nausea medication as prescribed.   If you develop nausea and vomiting that is not controlled by your nausea medication, call the clinic.   BELOW ARE SYMPTOMS THAT SHOULD BE REPORTED IMMEDIATELY:  *FEVER GREATER THAN 100.5 F  *CHILLS WITH OR WITHOUT FEVER  NAUSEA AND VOMITING THAT IS NOT CONTROLLED WITH YOUR NAUSEA MEDICATION  *UNUSUAL SHORTNESS OF BREATH  *UNUSUAL BRUISING OR BLEEDING  TENDERNESS IN MOUTH AND THROAT WITH OR WITHOUT PRESENCE OF ULCERS  *URINARY PROBLEMS  *BOWEL PROBLEMS  UNUSUAL RASH Items with * indicate a potential emergency and should be followed up as soon as possible.  Feel free to call the clinic should you have any questions or concerns. The clinic phone number is [phone].  Please show the [facility] at check-in to the Emergency Department and triage nurse.  Vincristine injection What is this medicine? VINCRISTINE (vin KRIS teen) is a chemotherapy drug. It slows the growth of cancer cells. This medicine is used to treat many types of cancer like Hodgkin's disease, leukemia, non-Hodgkin's lymphoma, neuroblastoma (brain cancer), rhabdomyosarcoma, and Wilms' tumor. This medicine may be used for other purposes; ask your health care provider or pharmacist if you have questions. COMMON BRAND NAME(S): Oncovin, Vincasar PFS What should I tell my health care provider before I take this medicine? They need to know if you have any of these conditions:  blood disorders  gout  infection (especially chickenpox, cold sores, or herpes)  kidney disease  liver disease  lung disease  nervous system disease like Charcot-Marie-Tooth (CMT)  recent or ongoing radiation therapy  an  unusual or allergic reaction to vincristine, other chemotherapy agents, other medicines, foods, dyes, or preservatives  pregnant or trying to get pregnant  breast-feeding How should I use this medicine? This drug is given as an infusion into a vein. It is administered in a hospital or clinic by a specially trained health care professional. If you have pain, swelling, burning, or any unusual feeling around the site of your injection, tell your health care professional right away. Talk to your pediatrician regarding the use of this medicine in children. While this drug may be prescribed for selected conditions, precautions do apply. Overdosage: If you think you have taken too much of this medicine contact a poison control center or emergency room at once. NOTE: This medicine is only for you. Do not share this medicine with others. What if I miss a dose? It is important not to miss your dose. Call your doctor or health care professional if you are unable to keep an appointment. What may interact with this medicine? Do not take this medicine with any of the following medications:  itraconazole  mibefradil  voriconazole This medicine may also interact with the following medications:  cyclosporine  erythromycin  fluconazole  ketoconazole  medicines for HIV like delavirdine, efavirenz, nevirapine  medicines for seizures like ethotoin, fosphenotoin, phenytoin  medicines to increase blood counts like filgrastim, pegfilgrastim, sargramostim  other chemotherapy drugs like cisplatin, L-asparaginase, methotrexate, mitomycin, paclitaxel  pegaspargase  vaccines  zalcitabine, ddC Talk to your doctor or health care professional before taking any of these medicines:  acetaminophen  aspirin  ibuprofen  ketoprofen  naproxen This list may not describe all possible interactions. Give your health care provider a  list of all the medicines, herbs, non-prescription drugs, or dietary  supplements you use. Also tell them if you smoke, drink alcohol, or use illegal drugs. Some items may interact with your medicine. What should I watch for while using this medicine? Your condition will be monitored carefully while you are receiving this medicine. You will need important blood work done while you are taking this medicine. This drug may make you feel generally unwell. This is not uncommon, as chemotherapy can affect healthy cells as well as cancer cells. Report any side effects. Continue your course of treatment even though you feel ill unless your doctor tells you to stop. In some cases, you may be given additional medicines to help with side effects. Follow all directions for their use. Call your doctor or health care professional for advice if you get a fever, chills or sore throat, or other symptoms of a cold or flu. Do not treat yourself. Avoid taking products that contain aspirin, acetaminophen, ibuprofen, naproxen, or ketoprofen unless instructed by your doctor. These medicines may hide a fever. Do not become pregnant while taking this medicine. Women should inform their doctor if they wish to become pregnant or think they might be pregnant. There is a potential for serious side effects to an unborn child. Talk to your health care professional or pharmacist for more information. Do not breast-feed an infant while taking this medicine. Men may have a lower sperm count while taking this medicine. Talk to your doctor if you plan to father a child. What side effects may I notice from receiving this medicine? Side effects that you should report to your doctor or health care professional as soon as possible:  allergic reactions like skin rash, itching or hives, swelling of the face, lips, or tongue  breathing problems  confusion or changes in emotions or moods  constipation  cough  mouth sores  muscle weakness  nausea and vomiting  pain, swelling, redness or irritation at  the injection site  pain, tingling, numbness in the hands or feet  problems with balance, talking, walking  seizures  stomach pain  trouble passing urine or change in the amount of urine Side effects that usually do not require medical attention (report to your doctor or health care professional if they continue or are bothersome):  diarrhea  hair loss  jaw pain  loss of appetite This list may not describe all possible side effects. Call your doctor for medical advice about side effects. You may report side effects to FDA at 1-800-FDA-1088. Where should I keep my medicine? This drug is given in a hospital or clinic and will not be stored at home. NOTE: This sheet is a summary. It may not cover all possible information. If you have questions about this medicine, talk to your doctor, pharmacist, or health care provider.  2020 Elsevier/Gold Standard (2008-03-24 17:17:13)  Cyclophosphamide injection What is this medicine? CYCLOPHOSPHAMIDE (sye kloe FOSS fa mide) is a chemotherapy drug. It slows the growth of cancer cells. This medicine is used to treat many types of cancer like lymphoma, myeloma, leukemia, breast cancer, and ovarian cancer, to name a few. This medicine may be used for other purposes; ask your health care provider or pharmacist if you have questions. COMMON BRAND NAME(S): Cytoxan, Neosar What should I tell my health care provider before I take this medicine? They need to know if you have any of these conditions:  blood disorders  history of other chemotherapy  infection  kidney disease  liver disease  recent or ongoing radiation therapy  tumors in the bone marrow  an unusual or allergic reaction to cyclophosphamide, other chemotherapy, other medicines, foods, dyes, or preservatives  pregnant or trying to get pregnant  breast-feeding How should I use this medicine? This drug is usually given as an injection into a vein or muscle or by infusion into a  vein. It is administered in a hospital or clinic by a specially trained health care professional. Talk to your pediatrician regarding the use of this medicine in children. Special care may be needed. Overdosage: If you think you have taken too much of this medicine contact a poison control center or emergency room at once. NOTE: This medicine is only for you. Do not share this medicine with others. What if I miss a dose? It is important not to miss your dose. Call your doctor or health care professional if you are unable to keep an appointment. What may interact with this medicine? This medicine may interact with the following medications:  amiodarone  amphotericin B  azathioprine  certain antiviral medicines for HIV or AIDS such as protease inhibitors (e.g., indinavir, ritonavir) and zidovudine  certain blood pressure medications such as benazepril, captopril, enalapril, fosinopril, lisinopril, moexipril, monopril, perindopril, quinapril, ramipril, trandolapril  certain cancer medications such as anthracyclines (e.g., daunorubicin, doxorubicin), busulfan, cytarabine, paclitaxel, pentostatin, tamoxifen, trastuzumab  certain diuretics such as chlorothiazide, chlorthalidone, hydrochlorothiazide, indapamide, metolazone  certain medicines that treat or prevent blood clots like warfarin  certain muscle relaxants such as succinylcholine  cyclosporine  etanercept  indomethacin  medicines to increase blood counts like filgrastim, pegfilgrastim, sargramostim  medicines used as general anesthesia  metronidazole  natalizumab This list may not describe all possible interactions. Give your health care provider a list of all the medicines, herbs, non-prescription drugs, or dietary supplements you use. Also tell them if you smoke, drink alcohol, or use illegal drugs. Some items may interact with your medicine. What should I watch for while using this medicine? Visit your doctor for checks  on your progress. This drug may make you feel generally unwell. This is not uncommon, as chemotherapy can affect healthy cells as well as cancer cells. Report any side effects. Continue your course of treatment even though you feel ill unless your doctor tells you to stop. Drink water or other fluids as directed. Urinate often, even at night. In some cases, you may be given additional medicines to help with side effects. Follow all directions for their use. Call your doctor or health care professional for advice if you get a fever, chills or sore throat, or other symptoms of a cold or flu. Do not treat yourself. This drug decreases your body's ability to fight infections. Try to avoid being around people who are sick. This medicine may increase your risk to bruise or bleed. Call your doctor or health care professional if you notice any unusual bleeding. Be careful brushing and flossing your teeth or using a toothpick because you may get an infection or bleed more easily. If you have any dental work done, tell your dentist you are receiving this medicine. You may get drowsy or dizzy. Do not drive, use machinery, or do anything that needs mental alertness until you know how this medicine affects you. Do not become pregnant while taking this medicine or for 1 year after stopping it. Women should inform their doctor if they wish to become pregnant or think they might be pregnant. Men should not father a child while  taking this medicine and for 4 months after stopping it. There is a potential for serious side effects to an unborn child. Talk to your health care professional or pharmacist for more information. Do not breast-feed an infant while taking this medicine. This medicine may interfere with the ability to have a child. This medicine has caused ovarian failure in some women. This medicine has caused reduced sperm counts in some men. You should talk with your doctor or health care professional if you are  concerned about your fertility. If you are going to have surgery, tell your doctor or health care professional that you have taken this medicine. What side effects may I notice from receiving this medicine? Side effects that you should report to your doctor or health care professional as soon as possible:  allergic reactions like skin rash, itching or hives, swelling of the face, lips, or tongue  low blood counts - this medicine may decrease the number of white blood cells, red blood cells and platelets. You may be at increased risk for infections and bleeding.  signs of infection - fever or chills, cough, sore throat, pain or difficulty passing urine  signs of decreased platelets or bleeding - bruising, pinpoint red spots on the skin, black, tarry stools, blood in the urine  signs of decreased red blood cells - unusually weak or tired, fainting spells, lightheadedness  breathing problems  dark urine  dizziness  palpitations  swelling of the ankles, feet, hands  trouble passing urine or change in the amount of urine  weight gain  yellowing of the eyes or skin Side effects that usually do not require medical attention (report to your doctor or health care professional if they continue or are bothersome):  changes in nail or skin color  hair loss  missed menstrual periods  mouth sores  nausea, vomiting This list may not describe all possible side effects. Call your doctor for medical advice about side effects. You may report side effects to FDA at 1-800-FDA-1088. Where should I keep my medicine? This drug is given in a hospital or clinic and will not be stored at home. NOTE: This sheet is a summary. It may not cover all possible information. If you have questions about this medicine, talk to your doctor, pharmacist, or health care provider.  2020 Elsevier/Gold Standard (2012-05-11 16:22:58)  Etoposide, VP-16 injection What is this medicine? ETOPOSIDE, VP-16 (e toe POE  side) is a chemotherapy drug. It is used to treat testicular cancer, lung cancer, and other cancers. This medicine may be used for other purposes; ask your health care provider or pharmacist if you have questions. COMMON BRAND NAME(S): Etopophos, Toposar, VePesid What should I tell my health care provider before I take this medicine? They need to know if you have any of these conditions:  infection  kidney disease  liver disease  low blood counts, like low white cell, platelet, or red cell counts  an unusual or allergic reaction to etoposide, other medicines, foods, dyes, or preservatives  pregnant or trying to get pregnant  breast-feeding How should I use this medicine? This medicine is for infusion into a vein. It is administered in a hospital or clinic by a specially trained health care professional. Talk to your pediatrician regarding the use of this medicine in children. Special care may be needed. Overdosage: If you think you have taken too much of this medicine contact a poison control center or emergency room at once. NOTE: This medicine is only for you.  Do not share this medicine with others. What if I miss a dose? It is important not to miss your dose. Call your doctor or health care professional if you are unable to keep an appointment. What may interact with this medicine?  aspirin  certain medications for seizures like carbamazepine, phenobarbital, phenytoin, valproic acid  cyclosporine  levamisole  warfarin This list may not describe all possible interactions. Give your health care provider a list of all the medicines, herbs, non-prescription drugs, or dietary supplements you use. Also tell them if you smoke, drink alcohol, or use illegal drugs. Some items may interact with your medicine. What should I watch for while using this medicine? Visit your doctor for checks on your progress. This drug may make you feel generally unwell. This is not uncommon, as  chemotherapy can affect healthy cells as well as cancer cells. Report any side effects. Continue your course of treatment even though you feel ill unless your doctor tells you to stop. In some cases, you may be given additional medicines to help with side effects. Follow all directions for their use. Call your doctor or health care professional for advice if you get a fever, chills or sore throat, or other symptoms of a cold or flu. Do not treat yourself. This drug decreases your body's ability to fight infections. Try to avoid being around people who are sick. This medicine may increase your risk to bruise or bleed. Call your doctor or health care professional if you notice any unusual bleeding. Talk to your doctor about your risk of cancer. You may be more at risk for certain types of cancers if you take this medicine. Do not become pregnant while taking this medicine or for at least 6 months after stopping it. Women should inform their doctor if they wish to become pregnant or think they might be pregnant. Women of child-bearing potential will need to have a negative pregnancy test before starting this medicine. There is a potential for serious side effects to an unborn child. Talk to your health care professional or pharmacist for more information. Do not breast-feed an infant while taking this medicine. Men must use a latex condom during sexual contact with a woman while taking this medicine and for at least 4 months after stopping it. A latex condom is needed even if you have had a vasectomy. Contact your doctor right away if your partner becomes pregnant. Do not donate sperm while taking this medicine and for at least 4 months after you stop taking this medicine. Men should inform their doctors if they wish to father a child. This medicine may lower sperm counts. What side effects may I notice from receiving this medicine? Side effects that you should report to your doctor or health care professional  as soon as possible:  allergic reactions like skin rash, itching or hives, swelling of the face, lips, or tongue  low blood counts - this medicine may decrease the number of white blood cells, red blood cells and platelets. You may be at increased risk for infections and bleeding.  signs of infection - fever or chills, cough, sore throat, pain or difficulty passing urine  signs of decreased platelets or bleeding - bruising, pinpoint red spots on the skin, black, tarry stools, blood in the urine  signs of decreased red blood cells - unusually weak or tired, fainting spells, lightheadedness  breathing problems  changes in vision  mouth or throat sores or ulcers  pain, redness, swelling or irritation at  the injection site  pain, tingling, numbness in the hands or feet  redness, blistering, peeling or loosening of the skin, including inside the mouth  seizures  vomiting Side effects that usually do not require medical attention (report to your doctor or health care professional if they continue or are bothersome):  diarrhea  hair loss  loss of appetite  nausea  stomach pain This list may not describe all possible side effects. Call your doctor for medical advice about side effects. You may report side effects to FDA at 1-800-FDA-1088. Where should I keep my medicine? This drug is given in a hospital or clinic and will not be stored at home. NOTE: This sheet is a summary. It may not cover all possible information. If you have questions about this medicine, talk to your doctor, pharmacist, or health care provider.  2020 Elsevier/Gold Standard (2015-06-19 11:53:23)  Rituximab injection What is this medicine? RITUXIMAB (ri TUX i mab) is a monoclonal antibody. It is used to treat certain types of cancer like non-Hodgkin lymphoma and chronic lymphocytic leukemia. It is also used to treat rheumatoid arthritis, granulomatosis with polyangiitis (or Wegener's granulomatosis),  microscopic polyangiitis, and pemphigus vulgaris. This medicine may be used for other purposes; ask your health care provider or pharmacist if you have questions. COMMON BRAND NAME(S): Rituxan, RUXIENCE What should I tell my health care provider before I take this medicine? They need to know if you have any of these conditions:  heart disease  infection (especially a virus infection such as hepatitis B, chickenpox, cold sores, or herpes)  immune system problems  irregular heartbeat  kidney disease  low blood counts, like low white cell, platelet, or red cell counts  lung or breathing disease, like asthma  recently received or scheduled to receive a vaccine  an unusual or allergic reaction to rituximab, other medicines, foods, dyes, or preservatives  pregnant or trying to get pregnant  breast-feeding How should I use this medicine? This medicine is for infusion into a vein. It is administered in a hospital or clinic by a specially trained health care professional. A special MedGuide will be given to you by the pharmacist with each prescription and refill. Be sure to read this information carefully each time. Talk to your pediatrician regarding the use of this medicine in children. This medicine is not approved for use in children. Overdosage: If you think you have taken too much of this medicine contact a poison control center or emergency room at once. NOTE: This medicine is only for you. Do not share this medicine with others. What if I miss a dose? It is important not to miss a dose. Call your doctor or health care professional if you are unable to keep an appointment. What may interact with this medicine?  cisplatin  live virus vaccines This list may not describe all possible interactions. Give your health care provider a list of all the medicines, herbs, non-prescription drugs, or dietary supplements you use. Also tell them if you smoke, drink alcohol, or use illegal  drugs. Some items may interact with your medicine. What should I watch for while using this medicine? Your condition will be monitored carefully while you are receiving this medicine. You may need blood work done while you are taking this medicine. This medicine can cause serious allergic reactions. To reduce your risk you may need to take medicine before treatment with this medicine. Take your medicine as directed. In some patients, this medicine may cause a serious brain infection  that may cause death. If you have any problems seeing, thinking, speaking, walking, or standing, tell your healthcare professional right away. If you cannot reach your healthcare professional, urgently seek other source of medical care. Call your doctor or health care professional for advice if you get a fever, chills or sore throat, or other symptoms of a cold or flu. Do not treat yourself. This drug decreases your body's ability to fight infections. Try to avoid being around people who are sick. Do not become pregnant while taking this medicine or for at least 12 months after stopping it. Women should inform their doctor if they wish to become pregnant or think they might be pregnant. There is a potential for serious side effects to an unborn child. Talk to your health care professional or pharmacist for more information. Do not breast-feed an infant while taking this medicine or for at least 6 months after stopping it. What side effects may I notice from receiving this medicine? Side effects that you should report to your doctor or health care professional as soon as possible:  allergic reactions like skin rash, itching or hives; swelling of the face, lips, or tongue  breathing problems  chest pain  changes in vision  diarrhea  headache with fever, neck stiffness, sensitivity to light, nausea, or confusion  fast, irregular heartbeat  loss of memory  low blood counts - this medicine may decrease the number of  white blood cells, red blood cells and platelets. You may be at increased risk for infections and bleeding.  mouth sores  problems with balance, talking, or walking  redness, blistering, peeling or loosening of the skin, including inside the mouth  signs of infection - fever or chills, cough, sore throat, pain or difficulty passing urine  signs and symptoms of kidney injury like trouble passing urine or change in the amount of urine  signs and symptoms of liver injury like dark yellow or brown urine; general ill feeling or flu-like symptoms; light-colored stools; loss of appetite; nausea; right upper belly pain; unusually weak or tired; yellowing of the eyes or skin  signs and symptoms of low blood pressure like dizziness; feeling faint or lightheaded, falls; unusually weak or tired  stomach pain  swelling of the ankles, feet, hands  unusual bleeding or bruising  vomiting Side effects that usually do not require medical attention (report to your doctor or health care professional if they continue or are bothersome):  headache  joint pain  muscle cramps or muscle pain  nausea  tiredness This list may not describe all possible side effects. Call your doctor for medical advice about side effects. You may report side effects to FDA at 1-800-FDA-1088. Where should I keep my medicine? This drug is given in a hospital or clinic and will not be stored at home. NOTE: This sheet is a summary. It may not cover all possible information. If you have questions about this medicine, talk to your doctor, pharmacist, or health care provider.  2020 Elsevier/Gold Standard (2018-08-08 22:01:36  Coronavirus (COVID-19) Are you at risk?  Are you at risk for the Coronavirus (COVID-19)?  To be considered HIGH RISK for Coronavirus (COVID-19), you have to meet the following criteria:  . Traveled to [place], [place], [place], [place] or [place]; or in the [place] to [place], [place], [place], or [place]; and have fever, cough, and shortness of breath within the last 2 weeks of travel OR . Been in close contact with a person  diagnosed with COVID-19 within the last 2 weeks and have fever, cough, and shortness of breath . IF YOU DO NOT MEET THESE CRITERIA, YOU ARE CONSIDERED LOW RISK FOR COVID-19.  What to do if you are HIGH RISK for COVID-19?  Marland Kitchen If you are having a medical emergency, call 911. . Seek medical care right away. Before you go to a doctor's office, urgent care or emergency department, call ahead and tell them about your recent travel, contact with someone diagnosed with COVID-19, and your symptoms. You should receive instructions from your physician's office regarding next steps of care.  . When you arrive at healthcare provider, tell the healthcare staff immediately you have returned from visiting [place], [place], [place], [place] or [place]; or traveled in the [place] to [place], [place], [place], or [place]; in the last two weeks or you have been in close contact with a person diagnosed with COVID-19 in the last 2 weeks.   . Tell the health care staff about your symptoms: fever, cough and shortness of breath. . After you have been seen by a medical provider, you will be either: o Tested for (COVID-19) and discharged home on quarantine except to seek medical care if symptoms worsen, and asked to  - Stay home and avoid contact with others until you get your results (4-5 days)  - Avoid travel on public transportation if possible (such as bus, train, or airplane) or o Sent to the Emergency Department by EMS for evaluation, COVID-19 testing, and possible admission depending on your condition and test results.  What to do if you are LOW RISK for COVID-19?  Reduce your risk of any infection by using the same precautions used for avoiding the common cold or flu:  Marland Kitchen Wash your hands often with soap and warm water for at least 20 seconds.  If soap and water are  not readily available, use an alcohol-based hand sanitizer with at least 60% alcohol.  . If coughing or sneezing, cover your mouth and nose by coughing or sneezing into the elbow areas of your shirt or coat, into a tissue or into your sleeve (not your hands). . Avoid shaking hands with others and consider head nods or verbal greetings only. . Avoid touching your eyes, nose, or mouth with unwashed hands.  . Avoid close contact with people who are sick. . Avoid places or events with large numbers of people in one location, like concerts or sporting events. . Carefully consider travel plans you have or are making. . If you are planning any travel outside or inside the [place], visit the CDC's Travelers' Health webpage for the latest health notices. . If you have some symptoms but not all symptoms, continue to monitor at home and seek medical attention if your symptoms worsen. . If you are having a medical emergency, call 911.   [place] / e-Visit: [email or web address]         [facility]: [facility]: [phone]                   [facility]: [phone]

## 2019-01-22 NOTE — Progress Notes (Signed)
Spoke w/ Dr. 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 He would like to keep this dose d/t curative intent. Patient does have G-CSF support.   Demetrius Charity, PharmD, [facility] Phone: [phone] 01/22/2019

## 2019-01-23 ENCOUNTER — Telehealth: Payer: Self-pay

## 2019-01-23 ENCOUNTER — Other Ambulatory Visit: Payer: Self-pay

## 2019-01-23 ENCOUNTER — Inpatient Hospital Stay: Payer: BC Managed Care – PPO

## 2019-01-23 VITALS — BP 118/93 | HR 60 | Temp 98.3°F | Resp 18

## 2019-01-23 DIAGNOSIS — C8338 Diffuse large B-cell lymphoma, lymph nodes of multiple sites: Secondary | ICD-10-CM | POA: Diagnosis not present

## 2019-01-23 DIAGNOSIS — Z7189 Other specified counseling: Secondary | ICD-10-CM

## 2019-01-23 MED ORDER — PROCHLORPERAZINE MALEATE 10 MG PO TABS
ORAL_TABLET | ORAL | Status: AC
  Filled 2019-01-23: qty 1

## 2019-01-23 MED ORDER — SODIUM CHLORIDE 0.9% FLUSH
10.0000 mL | INTRAVENOUS | Status: DC | PRN
  Administered 2019-01-23: 10 mL
  Filled 2019-01-23: qty 10

## 2019-01-23 MED ORDER — SODIUM CHLORIDE 0.9 % IV SOLN
100.0000 mg/m2 | Freq: Once | INTRAVENOUS | Status: AC
  Administered 2019-01-23: 220 mg via INTRAVENOUS
  Filled 2019-01-23: qty 11

## 2019-01-23 MED ORDER — PROCHLORPERAZINE MALEATE 10 MG PO TABS
10.0000 mg | ORAL_TABLET | Freq: Once | ORAL | Status: AC
  Administered 2019-01-23: 10 mg via ORAL

## 2019-01-23 MED ORDER — SODIUM CHLORIDE 0.9 % IV SOLN
Freq: Once | INTRAVENOUS | Status: AC
  Administered 2019-01-23: 09:00:00 via INTRAVENOUS
  Filled 2019-01-23: qty 250

## 2019-01-23 MED ORDER — HEPARIN SOD (PORK) LOCK FLUSH 100 UNIT/ML IV SOLN
500.0000 [IU] | Freq: Once | INTRAVENOUS | Status: AC | PRN
  Administered 2019-01-23: 500 [IU]
  Filled 2019-01-23: qty 5

## 2019-01-23 NOTE — Telephone Encounter (Signed)
-----   Message from Paulla Dolly, RN sent at 01/22/2019  9:02 AM EDT ----- Regarding: William Contreras 1st chem Please call pt for f/u

## 2019-01-23 NOTE — Telephone Encounter (Signed)
Called patient for first chemo treatment follow-up. No answer. Left voice message for patient to call [phone] with any questions or concerns.

## 2019-01-23 NOTE — Patient Instructions (Addendum)
[facility] Discharge Instructions for Patients Receiving Chemotherapy  Today you received the following chemotherapy agents Etoposide (VEPESID).  To help prevent nausea and vomiting after your treatment, we encourage you to take your nausea medication as prescribed.   If you develop nausea and vomiting that is not controlled by your nausea medication, call the clinic.   BELOW ARE SYMPTOMS THAT SHOULD BE REPORTED IMMEDIATELY:  *FEVER GREATER THAN 100.5 F  *CHILLS WITH OR WITHOUT FEVER  NAUSEA AND VOMITING THAT IS NOT CONTROLLED WITH YOUR NAUSEA MEDICATION  *UNUSUAL SHORTNESS OF BREATH  *UNUSUAL BRUISING OR BLEEDING  TENDERNESS IN MOUTH AND THROAT WITH OR WITHOUT PRESENCE OF ULCERS  *URINARY PROBLEMS  *BOWEL PROBLEMS  UNUSUAL RASH Items with * indicate a potential emergency and should be followed up as soon as possible.  Feel free to call the clinic should you have any questions or concerns. The clinic phone number is [phone].  Please show the [place] at check-in to the Emergency Department and triage nurse.  Etoposide, VP-16 capsules What is this medicine? ETOPOSIDE, VP-16 (e toe POE side) is a chemotherapy drug. It is used to treat small cell lung cancer and other cancers. This medicine may be used for other purposes; ask your health care provider or pharmacist if you have questions. COMMON BRAND NAME(S): VePesid What should I tell my health care provider before I take this medicine? They need to know if you have any of these conditions:  infection  kidney disease  liver disease  low blood counts, like low white cell, platelet, or red cell counts  an unusual or allergic reaction to etoposide, other medicines, foods, dyes, or preservatives  pregnant or trying to get pregnant  breast-feeding How should I use this medicine? Take this medicine by mouth with a glass of water. Follow the directions on the prescription label. Do not  open, crush, or chew the capsules. It is advisable to wear gloves when handling this medicine. Take your medicine at regular intervals. Do not take it more often than directed. Do not stop taking except on your doctor's advice. Talk to your pediatrician regarding the use of this medicine in children. Special care may be needed. Overdosage: If you think you have taken too much of this medicine contact a poison control center or emergency room at once. NOTE: This medicine is only for you. Do not share this medicine with others. What if I miss a dose? If you miss a dose, take it as soon as you can. If it is almost time for your next dose, take only that dose. Do not take double or extra doses. What may interact with this medicine?  aspirin  certain medications for seizures like carbamazepine, phenobarbital, phenytoin, valproic acid  cyclosporine  levamisole  valproic acid  warfarin This list may not describe all possible interactions. Give your health care provider a list of all the medicines, herbs, non-prescription drugs, or dietary supplements you use. Also tell them if you smoke, drink alcohol, or use illegal drugs. Some items may interact with your medicine. What should I watch for while using this medicine? Visit your doctor for checks on your progress. This drug may make you feel generally unwell. This is not uncommon, as chemotherapy can affect healthy cells as well as cancer cells. Report any side effects. Continue your course of treatment even though you feel ill unless your doctor tells you to stop. In some cases, you may be given additional medicines to help  with side effects. Follow all directions for their use. Call your doctor or health care professional for advice if you get a fever, chills or sore throat, or other symptoms of a cold or flu. Do not treat yourself. This drug decreases your body's ability to fight infections. Try to avoid being around people who are sick. This  medicine may increase your risk to bruise or bleed. Call your doctor or health care professional if you notice any unusual bleeding. Talk to your doctor about your risk of cancer. You may be more at risk for certain types of cancers if you take this medicine. Do not become pregnant while taking this medicine or for at least 6 months after stopping it. Women should inform their doctor if they wish to become pregnant or think they might be pregnant. Women of child-bearing potential will need to have a negative pregnancy test before starting this medicine. There is a potential for serious side effects to an unborn child. Talk to your health care professional or pharmacist for more information. Do not breast-feed an infant while taking this medicine. Men must use a latex condom during sexual contact with a woman while taking this medicine and for at least 4 months after stopping it. A latex condom is needed even if you have had a vasectomy. Contact your doctor right away if your partner becomes pregnant. Do not donate sperm while taking this medicine and for 4 months after you stop taking this medicine. Men should inform their doctors if they wish to father a child. This medicine may lower sperm counts. What side effects may I notice from receiving this medicine? Side effects that you should report to your doctor or health care professional as soon as possible:  allergic reactions like skin rash, itching or hives, swelling of the face, lips, or tongue  low blood counts - this medicine may decrease the number of white blood cells, red blood cells and platelets. You may be at increased risk for infections and bleeding.  signs of infection - fever or chills, cough, sore throat, pain or difficulty passing urine  signs of decreased platelets or bleeding - bruising, pinpoint red spots on the skin, black, tarry stools, blood in the urine  signs of decreased red blood cells - unusually weak or tired, fainting  spells, lightheadedness  breathing problems  changes in vision  mouth or throat sores or ulcers  pain, tingling, numbness in the hands or feet  redness, blistering, peeling or loosening of the skin, including inside the mouth  seizures  vomiting Side effects that usually do not require medical attention (report to your doctor or health care professional if they continue or are bothersome):  change in taste  diarrhea  hair loss  nausea  stomach pain This list may not describe all possible side effects. Call your doctor for medical advice about side effects. You may report side effects to FDA at 1-800-FDA-1088. Where should I keep my medicine? Keep out of the reach of children. Store in a refrigerator between 2 and 8 degrees C (36 and 46 degrees F). Do not freeze. Throw away any unused medicine after the expiration date. NOTE: This sheet is a summary. It may not cover all possible information. If you have questions about this medicine, talk to your doctor, pharmacist, or health care provider.  2020 Elsevier/Gold Standard (2015-06-19 11:49:52)   Coronavirus (COVID-19) Are you at risk?  Are you at risk for the Coronavirus (COVID-19)?  To be considered HIGH RISK  for Coronavirus (COVID-19), you have to meet the following criteria:  . Traveled to [place], [place], [place], [place] or [place]; or in the [place] to [place], [place], [place], or [place]; and have fever, cough, and shortness of breath within the last 2 weeks of travel OR . Been in close contact with a person diagnosed with COVID-19 within the last 2 weeks and have fever, cough, and shortness of breath . IF YOU DO NOT MEET THESE CRITERIA, YOU ARE CONSIDERED LOW RISK FOR COVID-19.  What to do if you are HIGH RISK for COVID-19?  Marland Kitchen If you are having a medical emergency, call 911. . Seek medical care right away. Before you go to a doctor's office, urgent care or emergency department, call ahead and tell  them about your recent travel, contact with someone diagnosed with COVID-19, and your symptoms. You should receive instructions from your physician's office regarding next steps of care.  . When you arrive at healthcare provider, tell the healthcare staff immediately you have returned from visiting [place], [place], [place], [place] or [place]; or traveled in the [place] to [place], [place], [place], or [place]; in the last two weeks or you have been in close contact with a person diagnosed with COVID-19 in the last 2 weeks.   . Tell the health care staff about your symptoms: fever, cough and shortness of breath. . After you have been seen by a medical provider, you will be either: o Tested for (COVID-19) and discharged home on quarantine except to seek medical care if symptoms worsen, and asked to  - Stay home and avoid contact with others until you get your results (4-5 days)  - Avoid travel on public transportation if possible (such as bus, train, or airplane) or o Sent to the Emergency Department by EMS for evaluation, COVID-19 testing, and possible admission depending on your condition and test results.  What to do if you are LOW RISK for COVID-19?  Reduce your risk of any infection by using the same precautions used for avoiding the common cold or flu:  Marland Kitchen Wash your hands often with soap and warm water for at least 20 seconds.  If soap and water are not readily available, use an alcohol-based hand sanitizer with at least 60% alcohol.  . If coughing or sneezing, cover your mouth and nose by coughing or sneezing into the elbow areas of your shirt or coat, into a tissue or into your sleeve (not your hands). . Avoid shaking hands with others and consider head nods or verbal greetings only. . Avoid touching your eyes, nose, or mouth with unwashed hands.  . Avoid close contact with people who are sick. . Avoid places or events with large numbers of people in one location, like concerts or  sporting events. . Carefully consider travel plans you have or are making. . If you are planning any travel outside or inside the [place], visit the CDC's Travelers' Health webpage for the latest health notices. . If you have some symptoms but not all symptoms, continue to monitor at home and seek medical attention if your symptoms worsen. . If you are having a medical emergency, call 911.   [place] / e-Visit: [email or web address]         [facility]: [facility]: [phone]  [facility]: [phone]

## 2019-01-24 ENCOUNTER — Inpatient Hospital Stay: Payer: BC Managed Care – PPO

## 2019-01-24 ENCOUNTER — Other Ambulatory Visit: Payer: Self-pay

## 2019-01-24 VITALS — BP 140/93 | HR 59 | Temp 98.5°F | Resp 18

## 2019-01-24 DIAGNOSIS — C8338 Diffuse large B-cell lymphoma, lymph nodes of multiple sites: Secondary | ICD-10-CM

## 2019-01-24 DIAGNOSIS — Z7189 Other specified counseling: Secondary | ICD-10-CM

## 2019-01-24 MED ORDER — SODIUM CHLORIDE 0.9 % IV SOLN
Freq: Once | INTRAVENOUS | Status: AC
  Administered 2019-01-24: 09:00:00 via INTRAVENOUS
  Filled 2019-01-24: qty 250

## 2019-01-24 MED ORDER — PROCHLORPERAZINE MALEATE 10 MG PO TABS
ORAL_TABLET | ORAL | Status: AC
  Filled 2019-01-24: qty 1

## 2019-01-24 MED ORDER — PROCHLORPERAZINE MALEATE 10 MG PO TABS
10.0000 mg | ORAL_TABLET | Freq: Once | ORAL | Status: AC
  Administered 2019-01-24: 10 mg via ORAL

## 2019-01-24 MED ORDER — SODIUM CHLORIDE 0.9% FLUSH
10.0000 mL | INTRAVENOUS | Status: DC | PRN
  Administered 2019-01-24: 10 mL
  Filled 2019-01-24: qty 10

## 2019-01-24 MED ORDER — HEPARIN SOD (PORK) LOCK FLUSH 100 UNIT/ML IV SOLN
500.0000 [IU] | Freq: Once | INTRAVENOUS | Status: AC | PRN
  Administered 2019-01-24: 500 [IU]
  Filled 2019-01-24: qty 5

## 2019-01-24 MED ORDER — SODIUM CHLORIDE 0.9 % IV SOLN
100.0000 mg/m2 | Freq: Once | INTRAVENOUS | Status: AC
  Administered 2019-01-24: 220 mg via INTRAVENOUS
  Filled 2019-01-24: qty 11

## 2019-01-24 NOTE — Patient Instructions (Signed)
[facility] Discharge Instructions for Patients Receiving Chemotherapy  Today you received the following chemotherapy agents: Etoposide (VEPESID).  To help prevent nausea and vomiting after your treatment, we encourage you to take your nausea medication as prescribed.   If you develop nausea and vomiting that is not controlled by your nausea medication, call the clinic.   BELOW ARE SYMPTOMS THAT SHOULD BE REPORTED IMMEDIATELY:  *FEVER GREATER THAN 100.5 F  *CHILLS WITH OR WITHOUT FEVER  NAUSEA AND VOMITING THAT IS NOT CONTROLLED WITH YOUR NAUSEA MEDICATION  *UNUSUAL SHORTNESS OF BREATH  *UNUSUAL BRUISING OR BLEEDING  TENDERNESS IN MOUTH AND THROAT WITH OR WITHOUT PRESENCE OF ULCERS  *URINARY PROBLEMS  *BOWEL PROBLEMS  UNUSUAL RASH Items with * indicate a potential emergency and should be followed up as soon as possible.  Feel free to call the clinic should you have any questions or concerns. The clinic phone number is [phone].  Please show the CHEMO ALERT CARD at check-in to the Emergency Department and triage nurse.   

## 2019-01-25 ENCOUNTER — Other Ambulatory Visit: Payer: Self-pay

## 2019-01-25 ENCOUNTER — Inpatient Hospital Stay: Payer: BC Managed Care – PPO

## 2019-01-25 ENCOUNTER — Ambulatory Visit: Payer: BC Managed Care – PPO

## 2019-01-25 VITALS — BP 128/78 | HR 62 | Temp 98.6°F | Resp 18

## 2019-01-25 DIAGNOSIS — Z7189 Other specified counseling: Secondary | ICD-10-CM

## 2019-01-25 DIAGNOSIS — C8338 Diffuse large B-cell lymphoma, lymph nodes of multiple sites: Secondary | ICD-10-CM

## 2019-01-25 MED ORDER — PEGFILGRASTIM-CBQV 6 MG/0.6ML ~~LOC~~ SOSY
6.0000 mg | PREFILLED_SYRINGE | Freq: Once | SUBCUTANEOUS | Status: AC
  Administered 2019-01-25: 6 mg via SUBCUTANEOUS

## 2019-01-25 MED ORDER — PEGFILGRASTIM-CBQV 6 MG/0.6ML ~~LOC~~ SOSY
PREFILLED_SYRINGE | SUBCUTANEOUS | Status: AC
  Filled 2019-01-25: qty 0.6

## 2019-01-25 NOTE — Patient Instructions (Signed)

## 2019-01-29 ENCOUNTER — Telehealth: Payer: Self-pay | Admitting: *Deleted

## 2019-01-29 ENCOUNTER — Other Ambulatory Visit: Payer: Self-pay | Admitting: Hematology

## 2019-01-29 NOTE — Telephone Encounter (Signed)
Patient called. Has  MD visit on 7/29. He wants to change it if possible to this week or the week after as he will be out of town. He has appt with MD and to have labs/infusion on 8/4.  Per Dr. Irene Limbo, if he is feeling ok, can wait to come until 8/4. Patient states he feels ok and will wait to see MD until 8/4.  Appts for 7/29 cancelled

## 2019-01-30 ENCOUNTER — Other Ambulatory Visit (HOSPITAL_COMMUNITY): Payer: Self-pay | Admitting: Internal Medicine

## 2019-01-31 ENCOUNTER — Telehealth: Payer: Self-pay | Admitting: *Deleted

## 2019-01-31 NOTE — Telephone Encounter (Signed)
Received faxed Telephone Advice fax from [place] call 01/30/2019 @ 5:08pm: Patient experienced pain in both legs into thighs and hips. Was advised to take Extra Strength tylenol for pain relief and seek healthcare assistance 7/23 if not relieved. Contacted patient to ask how the pain was today. Patient reports pain decreased with tylenol. Advised patient that this can be a side effect of the [place] he received on 7/17. Encouraged him to contact Dr.Kale's office if tylenol fails to relieve pain or if it gets worse. Patient verbalized understanding.

## 2019-02-05 ENCOUNTER — Telehealth (HOSPITAL_COMMUNITY): Payer: Self-pay

## 2019-02-05 NOTE — Telephone Encounter (Signed)
Received request for records from 07/11/2018 until present. No notes this year. Called Disability Determination Services and made them aware. Original to be scanned into chart.

## 2019-02-06 ENCOUNTER — Other Ambulatory Visit: Payer: BC Managed Care – PPO

## 2019-02-06 ENCOUNTER — Ambulatory Visit: Payer: BC Managed Care – PPO | Admitting: Hematology

## 2019-02-06 ENCOUNTER — Other Ambulatory Visit (HOSPITAL_COMMUNITY): Payer: Self-pay | Admitting: Cardiology

## 2019-02-11 ENCOUNTER — Other Ambulatory Visit: Payer: Self-pay | Admitting: Hematology

## 2019-02-11 ENCOUNTER — Other Ambulatory Visit: Payer: Self-pay | Admitting: *Deleted

## 2019-02-11 DIAGNOSIS — C8338 Diffuse large B-cell lymphoma, lymph nodes of multiple sites: Secondary | ICD-10-CM

## 2019-02-11 NOTE — Progress Notes (Signed)
HEMATOLOGY/ONCOLOGY CLINIC NOTE  Date of Service: 02/12/2019  Patient Care Team: Angelina Sheriff, MD as PCP - General (Family Medicine)  CHIEF COMPLAINTS/PURPOSE OF CONSULTATION:  Continue mx of  Diffuse Large B-Cell Lymphoma  HISTORY OF PRESENTING ILLNESS:   William Contreras is a wonderful 63 y.o. male who has been referred to [place] by Dr. Lanelle Bal in Cardiothoracic surgery for evaluation and management of his Newly diagnosed Diffuse Large B-Cell Lymphoma. The pt reports that he is doing well overall.   The pt reports that he has had a history of CABG x2 alongside replacing his aortic valve in 2018. His last ECHO was in November 2019 which revealed an LV EF of 45%. He was seen to have enlarged lymph nodes incidentally on a follow up CT with his cardiothoracic surgeon in May 2020. He denies fevers, chills, night sweats, unexpected weight loss. He has not noticed any new lumps or bumps and denies feeling differently presently as compared to 6 months ago. He denies any limiting symptoms when walking or with mild exercise. He denies any CHF exacerbations. The pt also notes a history of HLD and notes that he took statins for a little while which cause numbness and tingling in his toes and made him "feel mentally affected." He notes that he has well controlled HTN and endorses much improved GERD.  The pt notes that he does not have sleep apnea, there was once concern, but he lost weight to address this concern.  Of note prior to the patient's visit today, pt has had a PET/CT completed on 12/19/18 with results revealing "Hypermetabolic adenopathy within the chest, abdomen, and upper pelvis, most consistent with a lymphoproliferative process. 2. Aortic Atherosclerosis."   Most recent lab results (12/25/18) of CBC is as follows: all values are WNL.  On review of systems, pt reports stable energy levels, stable weight, and denies fevers, chills, night sweats, unexpected weight loss, new  lumps or bumps, difficulty breathing, SOB, CP, problems swallowing, dental concerns, painful teeth, recent infections, pain along the spine, abdominal pains, leg swelling, and any other symptoms.   On PMHx the pt reports s/p replacement of ascending aorta and aortic valve in 2018, HLD, HTN, CHF. On Social Hx the pt reports that he quit smoking cigarettes more than 20 years ago and denies any alcohol consumption. Works as a Environmental education officer. On Family Hx the pt reports brother with unspecified leukemia, father killed when pt was 89 years old. Mother well and living in 58s without medical concerns.  Interval History:   William Contreras returns today for management and evaluation of his recently diagnosed Diffuse Large B-Cell Lymphoma. The patient's last visit with [place] was on 01/18/2019. The pt reports that he is doing well overall.  The pt reports that after the first day of chemotherapy, he had stomach pain and constipation. He experienced body aches after his Neulasta shot, which he controlled with Tylenol. He has been eating well, staying hydrated, and walking for 50 min daily. He denies leg swelling, nausea.  Lab results today (02/12/2019) of CBC w/diff and CMP is as follows: all values are WNL except for WBC at 11.2k, HCT at 38.9, PLT at 410k, neutro abs at 9.6k, abs immature granulocytes at 0.21k, glucose bld at 108, AST at 14.  On review of systems, pt reports stomach pains and body pain due to chemotherapy, eating well, and denies nausea and any other symptoms.   MEDICAL HISTORY:  Past Medical History:  Diagnosis  Date  . CHF (congestive heart failure) ([place])   . Coronary artery disease   . Essential hypertension   . GERD (gastroesophageal reflux disease)   . Heart murmur   . Morbid obesity ([place])   . Neuromuscular disorder ([place])    burning in both feet - pt. thinks its related to statin   . Sleep apnea    told that he was borderline, but he has lost 50 lbs & is feeling much better      SURGICAL HISTORY: Past Surgical History:  Procedure Laterality Date  . AORTIC VALVE REPLACEMENT N/A 03/14/2017   Procedure: AORTIC VALVE REPLACEMENT (AVR);  Surgeon: Grace Isaac, MD;  Location: [facility];  Service: Open Heart Surgery;  Laterality: N/A;  Using 39mm Edwards Magna Ease Bioprosthetic Aortic Valve  . CARDIAC CATHETERIZATION N/A 03/10/2015   Procedure: Right/Left Heart Cath and Coronary Angiography;  Surgeon: Larey Dresser, MD;  Location: [facility];  Service: Cardiovascular;  Laterality: N/A;  . CARDIAC CATHETERIZATION N/A 03/10/2015   Procedure: Coronary Stent Intervention;  Surgeon: Leonie Man, MD;  Location: [facility];  Service: Cardiovascular;  Laterality: N/A;  . CORONARY ARTERY BYPASS GRAFT N/A 03/14/2017   Procedure: CORONARY ARTERY BYPASS GRAFTING (CABG), ON PUMP, TIMES TWO, USING LEFT INTERNAL MAMMARY ARTERY AND ENDOSCOPICALLY HARVESTED LEFT GREATER SAPHENOUS VEIN;  Surgeon: Grace Isaac, MD;  Location: [facility];  Service: Open Heart Surgery;  Laterality: N/A;  LIMA to LAD SVG to RAMUS INTERMEDIATE  . IR IMAGING GUIDED PORT INSERTION  01/08/2019  . REPLACEMENT ASCENDING AORTA N/A 03/14/2017   Procedure: REPLACEMENT ASCENDING AORTA WITH WHEAT PROCEDURE;  Surgeon: Grace Isaac, MD;  Location: [facility];  Service: Open Heart Surgery;  Laterality: N/A;  Using 67mm Hemashield Platinum Woven Double Velour Vascular Graft  . TEE WITHOUT CARDIOVERSION N/A 03/14/2017   Procedure: TRANSESOPHAGEAL ECHOCARDIOGRAM (TEE);  Surgeon: Grace Isaac, MD;  Location: [facility];  Service: Open Heart Surgery;  Laterality: N/A;  . TONSILLECTOMY      SOCIAL HISTORY: Social History   Socioeconomic History  . Marital status: Married    Spouse name: Not on file  . Number of children: Not on file  . Years of education: Not on file  . Highest education level: Not on file  Occupational History  . Not on file  Social Needs  . Financial resource strain: Not on file  . Food  insecurity    Worry: Not on file    Inability: Not on file  . Transportation needs    Medical: Not on file    Non-medical: Not on file  Tobacco Use  . Smoking status: Former Smoker    Packs/day: 1.00    Years: 15.00    Pack years: 15.00    Types: Cigarettes  . Smokeless tobacco: Never Used  . Tobacco comment: quit smoking ~ 20 yrs ago.  Substance and Sexual Activity  . Alcohol use: No    Alcohol/week: 0.0 standard drinks    Comment: prev drank - none in ~ 25 yrs.  . Drug use: No    Comment: prev smoked MJ, none in 25 yrs.  . Sexual activity: Yes  Lifestyle  . Physical activity    Days per week: Not on file    Minutes per session: Not on file  . Stress: Not on file  Relationships  . Social Herbalist on phone: Not on file    Gets together: Not on file  Attends religious service: Not on file    Active member of club or organization: Not on file    Attends meetings of clubs or organizations: Not on file    Relationship status: Not on file  . Intimate partner violence    Fear of current or ex partner: Not on file    Emotionally abused: Not on file    Physically abused: Not on file    Forced sexual activity: Not on file  Other Topics Concern  . Not on file  Social History Narrative   Pt lives in [place] with his wife and son.  He has 2 step children and another child - all grown and out of the house.    FAMILY HISTORY: Family History  Problem Relation Age of Onset  . Other Father        died young in [place].  Marland Kitchen Heart attack Mother        alive @ 66.  . Other Brother        A & W  . Other Sister        A & W    ALLERGIES:  is allergic to lipitor [atorvastatin] and penicillins.  MEDICATIONS:  Current Outpatient Medications  Medication Sig Dispense Refill  . acetaminophen (TYLENOL) 325 MG tablet Take 2 tablets (650 mg total) by mouth every 6 (six) hours as needed for mild pain.    Marland Kitchen allopurinol (ZYLOPRIM) 300 MG tablet Take 0.5 tablets (150 mg  total) by mouth daily. 30 tablet 0  . aspirin EC 81 MG tablet Take 1 tablet (81 mg total) by mouth daily.    . calcium citrate-vitamin D (CITRACAL+D) 315-200 MG-UNIT tablet Take 1 tablet by mouth daily.    . carvedilol (COREG) 6.25 MG tablet Take 1 tablet by mouth twice daily 60 tablet 0  . Coenzyme Q10 (COQ10) 400 MG CAPS Take 400 mg by mouth 2 (two) times daily.    . Flax OIL Take 1 capsule by mouth daily.    . Lactobacillus (ULTIMATE PROBIOTIC FORMULA) CAPS Take 1 capsule by mouth daily.     Marland Kitchen lidocaine-prilocaine (EMLA) cream Apply to affected area once 30 g 3  . lisinopril (ZESTRIL) 5 MG tablet Take 1 tablet by mouth twice daily 60 tablet 2  . LORazepam (ATIVAN) 0.5 MG tablet Take 1 tablet (0.5 mg total) by mouth every 6 (six) hours as needed (Nausea or vomiting). 30 tablet 0  . Magnesium 400 MG TABS Take 400 mg by mouth 2 (two) times daily.     . Omega-3 Fatty Acids (FISH OIL CONCENTRATE PO) Take 1 capsule by mouth daily.    . ondansetron (ZOFRAN) 8 MG tablet Take 1 tablet (8 mg total) by mouth 2 (two) times daily as needed for refractory nausea / vomiting. Start on day 3 after cyclophosphamide. 30 tablet 1  . oxymetazoline (AFRIN) 0.05 % nasal spray Place 1 spray into both nostrils at bedtime as needed for congestion.    . pantoprazole (PROTONIX) 40 MG tablet Take 1 tablet (40 mg total) by mouth daily. 30 tablet 1  . predniSONE (DELTASONE) 20 MG tablet Take 3 tablets (60 mg total) by mouth daily. Take on days 1-5 of chemotherapy. 15 tablet 5  . prochlorperazine (COMPAZINE) 10 MG tablet Take 1 tablet (10 mg total) by mouth every 6 (six) hours as needed (Nausea or vomiting). 30 tablet 1  . spironolactone (ALDACTONE) 25 MG tablet Take 1/2 (one-half) tablet by mouth once daily 45 tablet 1  No current facility-administered medications for this visit.     REVIEW OF SYSTEMS:    A 10+ POINT REVIEW OF SYSTEMS WAS OBTAINED including neurology, dermatology, psychiatry, cardiac, respiratory,  lymph, extremities, GI, GU, Musculoskeletal, constitutional, breasts, reproductive, HEENT.  All pertinent positives are noted in the HPI.  All others are negative.   PHYSICAL EXAMINATION: ECOG PERFORMANCE STATUS: 1 - Symptomatic but completely ambulatory  Vitals:   02/12/19 1119  BP: (!) 145/95  Pulse: 64  Resp: 18  Temp: 99.1 F (37.3 C)  SpO2: 99%   Filed Weights   02/12/19 1119  Weight: 228 lb 4.8 oz (103.6 kg)   .Body mass index is 32.76 kg/m.   GENERAL:alert, in no acute distress and comfortable SKIN: no acute rashes, no significant lesions EYES: conjunctiva are pink and non-injected, sclera anicteric OROPHARYNX: MMM, no exudates, no oropharyngeal erythema or ulceration NECK: supple, no JVD LYMPH:  no palpable lymphadenopathy in the cervical, axillary or inguinal regions LUNGS: clear to auscultation b/l with normal respiratory effort HEART: regular rate & rhythm ABDOMEN:  normoactive bowel sounds , non tender, not distended. No palpable hepatosplenomegaly.  Extremity: no pedal edema PSYCH: alert & oriented x 3 with fluent speech NEURO: no focal motor/sensory deficits   LABORATORY DATA:  I have reviewed the data as listed  . CBC Latest Ref Rng & Units 02/12/2019 01/18/2019 01/08/2019  WBC 4.0 - 10.5 K/uL 11.2(H) 4.5 5.7  Hemoglobin 13.0 - 17.0 g/dL 13.9 15.0 15.5  Hematocrit 39.0 - 52.0 % 38.9(L) 41.8 44.1  Platelets 150 - 400 K/uL 410(H) 218 241    . CMP Latest Ref Rng & Units 02/12/2019 01/18/2019 01/01/2019  Glucose 70 - 99 mg/dL 108(H) 96 95  BUN 8 - 23 mg/dL 14 18 12   Creatinine 0.61 - 1.24 mg/dL 0.98 0.93 1.03  Sodium 135 - 145 mmol/L 138 140 138  Potassium 3.5 - 5.1 mmol/L 4.6 4.1 4.4  Chloride 98 - 111 mmol/L 104 105 104  CO2 22 - 32 mmol/L 25 26 27   Calcium 8.9 - 10.3 mg/dL 10.1 9.1 9.4  Total Protein 6.5 - 8.1 g/dL 7.8 7.2 7.5  Total Bilirubin 0.3 - 1.2 mg/dL 0.3 0.5 0.7  Alkaline Phos 38 - 126 U/L 68 46 46  AST 15 - 41 U/L 14(L) 19 17  ALT 0 - 44 U/L  14 17 16     12/25/18 Left Retroperitoneal Biopsy:    RADIOGRAPHIC STUDIES: I have personally reviewed the radiological images as listed and agreed with the findings in the report. No results found.  ASSESSMENT & PLAN:  63 y.o. male with  1. Newly diagnosed Diffuse Large B-Cell Lymphoma, Stage III-A Labs upon initial presentation from 12/25/18, blood counts normal including WBC at 5.3k, HGB at 14.9 and PLT at 220k 12/25/18 Left retroperitoneal biopsy revealed Diffuse Large B-Cell Lymphoma 12/19/18 PET/CT revealed "Hypermetabolic adenopathy within the chest, abdomen, and upper pelvis, most consistent with a lymphoproliferative process. 2. Aortic Atherosclerosis." 01/01/19 Hep B and Hep C negative  01/04/19 ECHO revealed LV EF of 40-45%, moderately increased left ventricular wall thickness, impaired relaxation, and moderate hypokinesis of LV, entire inferior wall and apical segment. Discussed that the general treatment recommendation is up to 6 cycles of R-CHOP. However, given his cardiac considerations I would recommend substituting Etoposide for Doxorubicin for R-CEOP regimen to limit risk of worsening heart failure. Discussed that this choice can decrease treatment efficacy but would aim to preserve heart function. Pt strongly prefers outpatient treatment and to avoid anthracycline in  any form, which I also recommend.   PLAN:  -Discussed pt labwork today, 02/12/2019; blood counts and chemistries are good -Continue Allopurinol through C1 to prevent tumor lysis syndrome -The pt has no prohibitive toxicities from beginning C2 R-CEOP with G-CSF support, at this time. -chemorx orders reviewed and signed. -Will aim to repeat PET/CT after completing C3 and plan to treat for up to 6 cycles if well tolerated -Will need to watch fluid status throughout treatment -Will see the pt back in 3 weeks   RTC in 3 weeks for C3 of chemotherapy (plz schedule C3 per orders)with labs and MD visit   All of the  patients questions were answered with apparent satisfaction. The patient knows to call the clinic with any problems, questions or concerns.  The total time spent in the appt was 25 minutes and more than 50% was on counseling and direct patient cares.   Sullivan Lone MD MS AAHIVMS [facility] Hematology/Oncology Physician [facility]  (Office):       [phone] (Work cell):  [phone] (Fax):           [phone]  02/12/2019 11:56 AM  I, De Burrs, am acting as a scribe for Dr. Irene Limbo  .I have reviewed the above documentation for accuracy and completeness, and I agree with the above. Brunetta Genera MD

## 2019-02-12 ENCOUNTER — Inpatient Hospital Stay (HOSPITAL_BASED_OUTPATIENT_CLINIC_OR_DEPARTMENT_OTHER): Payer: BC Managed Care – PPO | Admitting: Hematology

## 2019-02-12 ENCOUNTER — Inpatient Hospital Stay: Payer: BC Managed Care – PPO

## 2019-02-12 ENCOUNTER — Telehealth: Payer: Self-pay | Admitting: Hematology

## 2019-02-12 ENCOUNTER — Other Ambulatory Visit: Payer: Self-pay

## 2019-02-12 ENCOUNTER — Inpatient Hospital Stay: Payer: BC Managed Care – PPO | Attending: Hematology

## 2019-02-12 VITALS — BP 145/95 | HR 64 | Temp 99.1°F | Resp 18 | Ht 70.0 in | Wt 228.3 lb

## 2019-02-12 VITALS — BP 111/70 | HR 60 | Temp 97.9°F | Resp 16

## 2019-02-12 DIAGNOSIS — Z95828 Presence of other vascular implants and grafts: Secondary | ICD-10-CM

## 2019-02-12 DIAGNOSIS — Z5111 Encounter for antineoplastic chemotherapy: Secondary | ICD-10-CM | POA: Insufficient documentation

## 2019-02-12 DIAGNOSIS — Z7189 Other specified counseling: Secondary | ICD-10-CM

## 2019-02-12 DIAGNOSIS — C8338 Diffuse large B-cell lymphoma, lymph nodes of multiple sites: Secondary | ICD-10-CM

## 2019-02-12 DIAGNOSIS — Z5112 Encounter for antineoplastic immunotherapy: Secondary | ICD-10-CM | POA: Insufficient documentation

## 2019-02-12 DIAGNOSIS — Z5189 Encounter for other specified aftercare: Secondary | ICD-10-CM | POA: Diagnosis not present

## 2019-02-12 LAB — CMP (CANCER CENTER ONLY)
ALT: 14 U/L (ref 0–44)
AST: 14 U/L — ABNORMAL LOW (ref 15–41)
Albumin: 3.8 g/dL (ref 3.5–5.0)
Alkaline Phosphatase: 68 U/L (ref 38–126)
Anion gap: 9 (ref 5–15)
BUN: 14 mg/dL (ref 8–23)
CO2: 25 mmol/L (ref 22–32)
Calcium: 10.1 mg/dL (ref 8.9–10.3)
Chloride: 104 mmol/L (ref 98–111)
Creatinine: 0.98 mg/dL (ref 0.61–1.24)
GFR, Est AFR Am: >60 mL/min (ref >60)
GFR, Estimated: >60 mL/min (ref >60)
Glucose, Bld: 108 mg/dL — ABNORMAL HIGH (ref 70–99)
Potassium: 4.6 mmol/L (ref 3.5–5.1)
Sodium: 138 mmol/L (ref 135–145)
Total Bilirubin: 0.3 mg/dL (ref 0.3–1.2)
Total Protein: 7.8 g/dL (ref 6.5–8.1)

## 2019-02-12 LAB — CBC WITH DIFFERENTIAL (CANCER CENTER ONLY)
Abs Immature Granulocytes: 0.21 10*3/uL — ABNORMAL HIGH (ref 0.00–0.07)
Basophils Absolute: 0 10*3/uL (ref 0.0–0.1)
Basophils Relative: 0 %
Eosinophils Absolute: 0 10*3/uL (ref 0.0–0.5)
Eosinophils Relative: 0 %
HCT: 38.9 % — ABNORMAL LOW (ref 39.0–52.0)
Hemoglobin: 13.9 g/dL (ref 13.0–17.0)
Immature Granulocytes: 2 %
Lymphocytes Relative: 9 %
Lymphs Abs: 1.1 10*3/uL (ref 0.7–4.0)
MCH: 32.1 pg (ref 26.0–34.0)
MCHC: 35.7 g/dL (ref 30.0–36.0)
MCV: 89.8 fL (ref 80.0–100.0)
Monocytes Absolute: 0.4 10*3/uL (ref 0.1–1.0)
Monocytes Relative: 3 %
Neutro Abs: 9.6 10*3/uL — ABNORMAL HIGH (ref 1.7–7.7)
Neutrophils Relative %: 86 %
Platelet Count: 410 10*3/uL — ABNORMAL HIGH (ref 150–400)
RBC: 4.33 MIL/uL (ref 4.22–5.81)
RDW: 11.5 % (ref 11.5–15.5)
WBC Count: 11.2 10*3/uL — ABNORMAL HIGH (ref 4.0–10.5)
nRBC: 0 % (ref 0.0–0.2)

## 2019-02-12 MED ORDER — DIPHENHYDRAMINE HCL 25 MG PO CAPS
50.0000 mg | ORAL_CAPSULE | Freq: Once | ORAL | Status: AC
  Administered 2019-02-12: 50 mg via ORAL

## 2019-02-12 MED ORDER — ACETAMINOPHEN 325 MG PO TABS
ORAL_TABLET | ORAL | Status: AC
  Filled 2019-02-12: qty 2

## 2019-02-12 MED ORDER — SODIUM CHLORIDE 0.9 % IV SOLN
375.0000 mg/m2 | Freq: Once | INTRAVENOUS | Status: AC
  Administered 2019-02-12: 800 mg via INTRAVENOUS
  Filled 2019-02-12: qty 50

## 2019-02-12 MED ORDER — SODIUM CHLORIDE 0.9 % IV SOLN
100.0000 mg/m2 | Freq: Once | INTRAVENOUS | Status: AC
  Administered 2019-02-12: 220 mg via INTRAVENOUS
  Filled 2019-02-12: qty 11

## 2019-02-12 MED ORDER — HEPARIN SOD (PORK) LOCK FLUSH 100 UNIT/ML IV SOLN
500.0000 [IU] | Freq: Once | INTRAVENOUS | Status: AC | PRN
  Administered 2019-02-12: 500 [IU]
  Filled 2019-02-12: qty 5

## 2019-02-12 MED ORDER — SODIUM CHLORIDE 0.9 % IV SOLN
Freq: Once | INTRAVENOUS | Status: AC
  Administered 2019-02-12: 12:00:00 via INTRAVENOUS
  Filled 2019-02-12: qty 250

## 2019-02-12 MED ORDER — PALONOSETRON HCL INJECTION 0.25 MG/5ML
INTRAVENOUS | Status: AC
  Filled 2019-02-12: qty 5

## 2019-02-12 MED ORDER — DIPHENHYDRAMINE HCL 25 MG PO CAPS
ORAL_CAPSULE | ORAL | Status: AC
  Filled 2019-02-12: qty 2

## 2019-02-12 MED ORDER — SODIUM CHLORIDE 0.9 % IV SOLN
750.0000 mg/m2 | Freq: Once | INTRAVENOUS | Status: AC
  Administered 2019-02-12: 1660 mg via INTRAVENOUS
  Filled 2019-02-12: qty 83

## 2019-02-12 MED ORDER — DEXAMETHASONE SODIUM PHOSPHATE 10 MG/ML IJ SOLN
INTRAMUSCULAR | Status: AC
  Filled 2019-02-12: qty 1

## 2019-02-12 MED ORDER — SODIUM CHLORIDE 0.9% FLUSH
10.0000 mL | Freq: Once | INTRAVENOUS | Status: AC
  Administered 2019-02-12: 10 mL
  Filled 2019-02-12: qty 10

## 2019-02-12 MED ORDER — VINCRISTINE SULFATE CHEMO INJECTION 1 MG/ML
2.0000 mg | Freq: Once | INTRAVENOUS | Status: AC
  Administered 2019-02-12: 2 mg via INTRAVENOUS
  Filled 2019-02-12: qty 2

## 2019-02-12 MED ORDER — ACETAMINOPHEN 325 MG PO TABS
650.0000 mg | ORAL_TABLET | Freq: Once | ORAL | Status: AC
  Administered 2019-02-12: 650 mg via ORAL

## 2019-02-12 MED ORDER — DEXAMETHASONE SODIUM PHOSPHATE 10 MG/ML IJ SOLN
10.0000 mg | Freq: Once | INTRAMUSCULAR | Status: AC
  Administered 2019-02-12: 10 mg via INTRAVENOUS

## 2019-02-12 MED ORDER — PALONOSETRON HCL INJECTION 0.25 MG/5ML
0.2500 mg | Freq: Once | INTRAVENOUS | Status: AC
  Administered 2019-02-12: 0.25 mg via INTRAVENOUS

## 2019-02-12 MED ORDER — SODIUM CHLORIDE 0.9% FLUSH
10.0000 mL | INTRAVENOUS | Status: DC | PRN
  Administered 2019-02-12: 10 mL
  Filled 2019-02-12: qty 10

## 2019-02-12 NOTE — Telephone Encounter (Signed)
Scheduled appt per 8/4 los. °

## 2019-02-12 NOTE — Patient Instructions (Signed)
[facility] Discharge Instructions for Patients Receiving Chemotherapy  Today you received the following chemotherapy agents: vincristine, cytoxan, etoposide, rituxan.  To help prevent nausea and vomiting after your treatment, we encourage you to take your nausea medication as prescribed.   If you develop nausea and vomiting that is not controlled by your nausea medication, call the clinic.   BELOW ARE SYMPTOMS THAT SHOULD BE REPORTED IMMEDIATELY:  *FEVER GREATER THAN 100.5 F  *CHILLS WITH OR WITHOUT FEVER  NAUSEA AND VOMITING THAT IS NOT CONTROLLED WITH YOUR NAUSEA MEDICATION  *UNUSUAL SHORTNESS OF BREATH  *UNUSUAL BRUISING OR BLEEDING  TENDERNESS IN MOUTH AND THROAT WITH OR WITHOUT PRESENCE OF ULCERS  *URINARY PROBLEMS  *BOWEL PROBLEMS  UNUSUAL RASH Items with * indicate a potential emergency and should be followed up as soon as possible.  Feel free to call the clinic should you have any questions or concerns. The clinic phone number is [phone].  Please show the [facility] at check-in to the Emergency Department and triage nurse.

## 2019-02-13 ENCOUNTER — Inpatient Hospital Stay: Payer: BC Managed Care – PPO

## 2019-02-13 ENCOUNTER — Other Ambulatory Visit: Payer: Self-pay | Admitting: Hematology

## 2019-02-13 ENCOUNTER — Other Ambulatory Visit: Payer: Self-pay

## 2019-02-13 VITALS — BP 135/79 | HR 54 | Temp 98.0°F | Resp 17

## 2019-02-13 DIAGNOSIS — Z7189 Other specified counseling: Secondary | ICD-10-CM

## 2019-02-13 DIAGNOSIS — C8338 Diffuse large B-cell lymphoma, lymph nodes of multiple sites: Secondary | ICD-10-CM

## 2019-02-13 DIAGNOSIS — Z5111 Encounter for antineoplastic chemotherapy: Secondary | ICD-10-CM | POA: Diagnosis not present

## 2019-02-13 MED ORDER — PROCHLORPERAZINE MALEATE 10 MG PO TABS
10.0000 mg | ORAL_TABLET | Freq: Once | ORAL | Status: AC
  Administered 2019-02-13: 10 mg via ORAL

## 2019-02-13 MED ORDER — PROCHLORPERAZINE MALEATE 10 MG PO TABS
ORAL_TABLET | ORAL | Status: AC
  Filled 2019-02-13: qty 1

## 2019-02-13 MED ORDER — SODIUM CHLORIDE 0.9% FLUSH
10.0000 mL | INTRAVENOUS | Status: DC | PRN
  Administered 2019-02-13: 14:00:00 10 mL
  Filled 2019-02-13: qty 10

## 2019-02-13 MED ORDER — HEPARIN SOD (PORK) LOCK FLUSH 100 UNIT/ML IV SOLN
500.0000 [IU] | Freq: Once | INTRAVENOUS | Status: AC | PRN
  Administered 2019-02-13: 14:00:00 500 [IU]
  Filled 2019-02-13: qty 5

## 2019-02-13 MED ORDER — SODIUM CHLORIDE 0.9 % IV SOLN
Freq: Once | INTRAVENOUS | Status: AC
  Administered 2019-02-13: 12:00:00 via INTRAVENOUS
  Filled 2019-02-13: qty 250

## 2019-02-13 MED ORDER — SODIUM CHLORIDE 0.9 % IV SOLN
100.0000 mg/m2 | Freq: Once | INTRAVENOUS | Status: AC
  Administered 2019-02-13: 12:00:00 220 mg via INTRAVENOUS
  Filled 2019-02-13: qty 11

## 2019-02-13 NOTE — Patient Instructions (Signed)
[facility] Discharge Instructions for Patients Receiving Chemotherapy  Today you received the following chemotherapy agents: Etoposide (VEPESID).  To help prevent nausea and vomiting after your treatment, we encourage you to take your nausea medication as prescribed.   If you develop nausea and vomiting that is not controlled by your nausea medication, call the clinic.   BELOW ARE SYMPTOMS THAT SHOULD BE REPORTED IMMEDIATELY:  *FEVER GREATER THAN 100.5 F  *CHILLS WITH OR WITHOUT FEVER  NAUSEA AND VOMITING THAT IS NOT CONTROLLED WITH YOUR NAUSEA MEDICATION  *UNUSUAL SHORTNESS OF BREATH  *UNUSUAL BRUISING OR BLEEDING  TENDERNESS IN MOUTH AND THROAT WITH OR WITHOUT PRESENCE OF ULCERS  *URINARY PROBLEMS  *BOWEL PROBLEMS  UNUSUAL RASH Items with * indicate a potential emergency and should be followed up as soon as possible.  Feel free to call the clinic should you have any questions or concerns. The clinic phone number is [phone].  Please show the CHEMO ALERT CARD at check-in to the Emergency Department and triage nurse.   

## 2019-02-14 ENCOUNTER — Inpatient Hospital Stay: Payer: BC Managed Care – PPO

## 2019-02-14 ENCOUNTER — Other Ambulatory Visit: Payer: Self-pay

## 2019-02-14 VITALS — BP 125/85 | HR 69 | Temp 98.7°F | Resp 20

## 2019-02-14 DIAGNOSIS — Z5111 Encounter for antineoplastic chemotherapy: Secondary | ICD-10-CM | POA: Diagnosis not present

## 2019-02-14 DIAGNOSIS — Z7189 Other specified counseling: Secondary | ICD-10-CM

## 2019-02-14 DIAGNOSIS — C8338 Diffuse large B-cell lymphoma, lymph nodes of multiple sites: Secondary | ICD-10-CM

## 2019-02-14 MED ORDER — PROCHLORPERAZINE MALEATE 10 MG PO TABS
10.0000 mg | ORAL_TABLET | Freq: Once | ORAL | Status: AC
  Administered 2019-02-14: 10 mg via ORAL

## 2019-02-14 MED ORDER — PROCHLORPERAZINE MALEATE 10 MG PO TABS
ORAL_TABLET | ORAL | Status: AC
  Filled 2019-02-14: qty 1

## 2019-02-14 MED ORDER — SODIUM CHLORIDE 0.9% FLUSH
10.0000 mL | INTRAVENOUS | Status: DC | PRN
  Administered 2019-02-14: 10 mL
  Filled 2019-02-14: qty 10

## 2019-02-14 MED ORDER — HEPARIN SOD (PORK) LOCK FLUSH 100 UNIT/ML IV SOLN
500.0000 [IU] | Freq: Once | INTRAVENOUS | Status: AC | PRN
  Administered 2019-02-14: 500 [IU]
  Filled 2019-02-14: qty 5

## 2019-02-14 MED ORDER — SODIUM CHLORIDE 0.9 % IV SOLN
Freq: Once | INTRAVENOUS | Status: AC
  Administered 2019-02-14: 14:00:00 via INTRAVENOUS
  Filled 2019-02-14: qty 250

## 2019-02-14 MED ORDER — SODIUM CHLORIDE 0.9 % IV SOLN
100.0000 mg/m2 | Freq: Once | INTRAVENOUS | Status: AC
  Administered 2019-02-14: 220 mg via INTRAVENOUS
  Filled 2019-02-14: qty 11

## 2019-02-14 NOTE — Patient Instructions (Signed)
[facility] Discharge Instructions for Patients Receiving Chemotherapy  Today you received the following chemotherapy agents: Etoposide (VEPESID).  To help prevent nausea and vomiting after your treatment, we encourage you to take your nausea medication as prescribed.   If you develop nausea and vomiting that is not controlled by your nausea medication, call the clinic.   BELOW ARE SYMPTOMS THAT SHOULD BE REPORTED IMMEDIATELY:  *FEVER GREATER THAN 100.5 F  *CHILLS WITH OR WITHOUT FEVER  NAUSEA AND VOMITING THAT IS NOT CONTROLLED WITH YOUR NAUSEA MEDICATION  *UNUSUAL SHORTNESS OF BREATH  *UNUSUAL BRUISING OR BLEEDING  TENDERNESS IN MOUTH AND THROAT WITH OR WITHOUT PRESENCE OF ULCERS  *URINARY PROBLEMS  *BOWEL PROBLEMS  UNUSUAL RASH Items with * indicate a potential emergency and should be followed up as soon as possible.  Feel free to call the clinic should you have any questions or concerns. The clinic phone number is [phone].  Please show the CHEMO ALERT CARD at check-in to the Emergency Department and triage nurse.   

## 2019-02-15 ENCOUNTER — Inpatient Hospital Stay: Payer: BC Managed Care – PPO

## 2019-02-15 ENCOUNTER — Telehealth: Payer: Self-pay | Admitting: *Deleted

## 2019-02-15 ENCOUNTER — Other Ambulatory Visit: Payer: Self-pay

## 2019-02-15 VITALS — BP 145/94 | HR 60 | Temp 98.2°F | Resp 18

## 2019-02-15 DIAGNOSIS — Z5111 Encounter for antineoplastic chemotherapy: Secondary | ICD-10-CM | POA: Diagnosis not present

## 2019-02-15 DIAGNOSIS — Z7189 Other specified counseling: Secondary | ICD-10-CM

## 2019-02-15 DIAGNOSIS — C8338 Diffuse large B-cell lymphoma, lymph nodes of multiple sites: Secondary | ICD-10-CM

## 2019-02-15 MED ORDER — PEGFILGRASTIM-CBQV 6 MG/0.6ML ~~LOC~~ SOSY
6.0000 mg | PREFILLED_SYRINGE | Freq: Once | SUBCUTANEOUS | Status: AC
  Administered 2019-02-15: 6 mg via SUBCUTANEOUS

## 2019-02-15 MED ORDER — PEGFILGRASTIM-CBQV 6 MG/0.6ML ~~LOC~~ SOSY
PREFILLED_SYRINGE | SUBCUTANEOUS | Status: AC
  Filled 2019-02-15: qty 0.6

## 2019-02-15 NOTE — Telephone Encounter (Signed)
Patient left voice mail stating that on one of Dr. Grier Mitts prescriptions, insurance will not pay for the small amount of medicine. In order for insurance to pay, prescription will need to be re written for a larger amount.   Attempted to contact patient to inquire as to which medication he was referencing. Left voice mail on named VM asking him to contact office.

## 2019-02-15 NOTE — Patient Instructions (Signed)

## 2019-02-27 ENCOUNTER — Other Ambulatory Visit (HOSPITAL_COMMUNITY): Payer: Self-pay | Admitting: Internal Medicine

## 2019-03-04 ENCOUNTER — Other Ambulatory Visit: Payer: Self-pay | Admitting: *Deleted

## 2019-03-04 DIAGNOSIS — C8338 Diffuse large B-cell lymphoma, lymph nodes of multiple sites: Secondary | ICD-10-CM

## 2019-03-04 NOTE — Progress Notes (Signed)
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 male who has been referred to [place] by Dr. Lanelle Bal in Cardiothoracic surgery for evaluation and management of his Newly diagnosed Diffuse Large B-Cell Lymphoma. The pt reports that he is doing well overall.   The pt reports that he has had a history of CABG x2 alongside replacing his aortic valve in 2018. His last ECHO was in November 2019 which revealed an LV EF of 45%. He was seen to have enlarged lymph nodes incidentally on a follow up CT with his cardiothoracic surgeon in May 2020. He denies fevers, chills, night sweats, unexpected weight loss. He has not noticed any new lumps or bumps and denies feeling differently presently as compared to 6 months ago. He denies any limiting symptoms when walking or with mild exercise. He denies any CHF exacerbations. The pt also notes a history of HLD and notes that he took statins for a little while which cause numbness and tingling in his toes and made him "feel mentally affected." He notes that he has well controlled HTN and endorses much improved GERD.  The pt notes that he does not have sleep apnea, there was once concern, but he lost weight to address this concern.  Of note prior to the patient's visit today, pt has had a PET/CT completed on 12/19/18 with results revealing "Hypermetabolic adenopathy within the chest, abdomen, and upper pelvis, most consistent with a lymphoproliferative process. 2. Aortic Atherosclerosis."   Most recent lab results (12/25/18) of CBC is as follows: all values are WNL.  On review of systems, pt reports stable energy levels, stable weight, and denies fevers, chills, night sweats, unexpected weight loss, new  lumps or bumps, difficulty breathing, SOB, CP, problems swallowing, dental concerns, painful teeth, recent infections, pain along the spine, abdominal pains, leg swelling, and any other symptoms.   On PMHx the pt reports s/p replacement of ascending aorta and aortic valve in 2018, HLD, HTN, CHF. On Social Hx the pt reports that he quit smoking cigarettes more than 20 years ago and denies any alcohol consumption. Works as a Environmental education officer. On Family Hx the pt reports brother with unspecified leukemia, father killed when pt was 42 years old. Mother well and living in 37s without medical concerns.  Interval History:   William Contreras returns today for management and evaluation of his recently diagnosed Diffuse Large B-Cell Lymphoma. The patient's last visit with [place] was on 02/12/2019. The pt reports that he is doing well overall.  The pt reports he has been eating and sleeping well. Pt has taken Colace to prevent some constipation that he gets after treatment. Pts fatigue is the worst in the first week and then improves quickly. Pts port is a little sore.   Lab results today (03/05/19) of CBC w/diff and CMP is as follows: all values are WNL except for RBC at 4.04, HCT at 37.5, Neutro Abs at 8.8K, Glucose at 135.   On review of systems, pt reports diarrhea, mouth soreness after treatment, hair loss, fatigue and denies, SOB, chest pain, back pain, abdominal pain, leg swelling, skin rashes, fevers, infection issues, nausea, vomiting, eye redness or discomfort and any other symptoms.   MEDICAL HISTORY:  Past Medical History:  Diagnosis Date  . CHF (congestive heart failure) ([place])   . Coronary artery disease   . Essential hypertension   . GERD (gastroesophageal reflux disease)   . Heart murmur   . Morbid obesity ([place])   . Neuromuscular disorder ([place])    burning in both feet - pt. thinks its related to statin   . Sleep apnea    told that he was borderline, but he has lost 50 lbs & is feeling much  better     SURGICAL HISTORY: Past Surgical History:  Procedure Laterality Date  . AORTIC VALVE REPLACEMENT N/A 03/14/2017   Procedure: AORTIC VALVE REPLACEMENT (AVR);  Surgeon: Grace Isaac, MD;  Location: [facility];  Service: Open Heart Surgery;  Laterality: N/A;  Using 64mm Edwards Magna Ease Bioprosthetic Aortic Valve  . CARDIAC CATHETERIZATION N/A 03/10/2015   Procedure: Right/Left Heart Cath and Coronary Angiography;  Surgeon: Larey Dresser, MD;  Location: [facility];  Service: Cardiovascular;  Laterality: N/A;  . CARDIAC CATHETERIZATION N/A 03/10/2015   Procedure: Coronary Stent Intervention;  Surgeon: Leonie Man, MD;  Location: [facility];  Service: Cardiovascular;  Laterality: N/A;  . CORONARY ARTERY BYPASS GRAFT N/A 03/14/2017   Procedure: CORONARY ARTERY BYPASS GRAFTING (CABG), ON PUMP, TIMES TWO, USING LEFT INTERNAL MAMMARY ARTERY AND ENDOSCOPICALLY HARVESTED LEFT GREATER SAPHENOUS VEIN;  Surgeon: Grace Isaac, MD;  Location: [facility];  Service: Open Heart Surgery;  Laterality: N/A;  LIMA to LAD SVG to RAMUS INTERMEDIATE  . IR IMAGING GUIDED PORT INSERTION  01/08/2019  . REPLACEMENT ASCENDING AORTA N/A 03/14/2017   Procedure: REPLACEMENT ASCENDING AORTA WITH WHEAT PROCEDURE;  Surgeon: Grace Isaac, MD;  Location: [facility];  Service: Open Heart Surgery;  Laterality: N/A;  Using 30mm Hemashield Platinum Woven Double Velour Vascular Graft  . TEE WITHOUT CARDIOVERSION N/A 03/14/2017   Procedure: TRANSESOPHAGEAL ECHOCARDIOGRAM (TEE);  Surgeon: Grace Isaac, MD;  Location: [facility];  Service: Open Heart Surgery;  Laterality: N/A;  . TONSILLECTOMY      SOCIAL HISTORY: Social History   Socioeconomic History  . Marital status: Married    Spouse name: Not on file  . Number of children: Not on file  . Years of education: Not on file  . Highest education level: Not on file  Occupational History  . Not on file  Social Needs  . Financial resource strain: Not on  file  . Food insecurity    Worry: Not on file    Inability: Not on file  . Transportation needs    Medical: Not on file    Non-medical: Not on file  Tobacco Use  . Smoking status: Former Smoker    Packs/day: 1.00    Years: 15.00    Pack years: 15.00    Types: Cigarettes  . Smokeless tobacco: Never Used  . Tobacco comment: quit smoking ~ 20 yrs ago.  Substance and Sexual Activity  . Alcohol use: No    Alcohol/week: 0.0 standard drinks    Comment: prev drank - none in ~ 25 yrs.  . Drug use: No    Comment: prev smoked MJ, none in 25 yrs.  . Sexual activity: Yes  Lifestyle  . Physical activity    Days per week: Not on file    Minutes per session: Not on file  . Stress: Not on file  Relationships  . Social Herbalist on phone: Not on file    Gets together: Not on file  Attends religious service: Not on file    Active member of club or organization: Not on file    Attends meetings of clubs or organizations: Not on file    Relationship status: Not on file  . Intimate partner violence    Fear of current or ex partner: Not on file    Emotionally abused: Not on file    Physically abused: Not on file    Forced sexual activity: Not on file  Other Topics Concern  . Not on file  Social History Narrative   Pt lives in [place] with his wife and son.  He has 2 step children and another child - all grown and out of the house.    FAMILY HISTORY: Family History  Problem Relation Age of Onset  . Other Father        died young in [place].  Marland Kitchen Heart attack Mother        alive @ 50.  . Other Brother        A & W  . Other Sister        A & W    ALLERGIES:  is allergic to lipitor [atorvastatin] and penicillins.  MEDICATIONS:  Current Outpatient Medications  Medication Sig Dispense Refill  . acetaminophen (TYLENOL) 325 MG tablet Take 2 tablets (650 mg total) by mouth every 6 (six) hours as needed for mild pain.    Marland Kitchen allopurinol (ZYLOPRIM) 300 MG tablet Take 0.5 tablets  (150 mg total) by mouth daily. 30 tablet 0  . aspirin EC 81 MG tablet Take 1 tablet (81 mg total) by mouth daily.    . calcium citrate-vitamin D (CITRACAL+D) 315-200 MG-UNIT tablet Take 1 tablet by mouth daily.    . carvedilol (COREG) 6.25 MG tablet Take 1 tablet by mouth twice daily 60 tablet 2  . Coenzyme Q10 (COQ10) 400 MG CAPS Take 400 mg by mouth 2 (two) times daily.    . Flax OIL Take 1 capsule by mouth daily.    . Lactobacillus (ULTIMATE PROBIOTIC FORMULA) CAPS Take 1 capsule by mouth daily.     Marland Kitchen lidocaine-prilocaine (EMLA) cream Apply to affected area once 30 g 3  . lisinopril (ZESTRIL) 5 MG tablet Take 1 tablet by mouth twice daily 60 tablet 2  . LORazepam (ATIVAN) 0.5 MG tablet TAKE 1 TABLET BY MOUTH EVERY 6 HOURS AS NEEDED (FOR NAUSEA OR VOMITING) 30 tablet 0  . Magnesium 400 MG TABS Take 400 mg by mouth 2 (two) times daily.     . Omega-3 Fatty Acids (FISH OIL CONCENTRATE PO) Take 1 capsule by mouth daily.    . ondansetron (ZOFRAN) 8 MG tablet Take 1 tablet (8 mg total) by mouth 2 (two) times daily as needed for refractory nausea / vomiting. Start on day 3 after cyclophosphamide. 30 tablet 1  . oxymetazoline (AFRIN) 0.05 % nasal spray Place 1 spray into both nostrils at bedtime as needed for congestion.    . pantoprazole (PROTONIX) 40 MG tablet Take 1 tablet (40 mg total) by mouth daily. 30 tablet 1  . predniSONE (DELTASONE) 20 MG tablet Take 3 tablets (60 mg total) by mouth daily. Take on days 1-5 of chemotherapy. 15 tablet 5  . prochlorperazine (COMPAZINE) 10 MG tablet Take 1 tablet (10 mg total) by mouth every 6 (six) hours as needed (Nausea or vomiting). 30 tablet 1  . spironolactone (ALDACTONE) 25 MG tablet Take 1/2 (one-half) tablet by mouth once daily 45 tablet 1   No current  facility-administered medications for this visit.     REVIEW OF SYSTEMS:    A 10+ POINT REVIEW OF SYSTEMS WAS OBTAINED including neurology, dermatology, psychiatry, cardiac, respiratory, lymph,  extremities, GI, GU, Musculoskeletal, constitutional, breasts, reproductive, HEENT.  All pertinent positives are noted in the HPI.  All others are negative.   PHYSICAL EXAMINATION: ECOG PERFORMANCE STATUS: 1 - Symptomatic but completely ambulatory  Vitals:   03/05/19 1043  BP: (!) 143/96  Pulse: 71  Resp: 18  Temp: 98.7 F (37.1 C)  SpO2: 99%   Filed Weights   03/05/19 1043  Weight: 229 lb 12.8 oz (104.2 kg)   .Body mass index is 32.97 kg/m.     GENERAL:alert, in no acute distress and comfortable SKIN: no acute rashes, no significant lesions EYES: conjunctiva are pink and non-injected, sclera anicteric OROPHARYNX: MMM, no exudates, no oropharyngeal erythema or ulceration NECK: supple, no JVD LYMPH:  no palpable lymphadenopathy in the cervical, axillary or inguinal regions LUNGS: clear to auscultation b/l with normal respiratory effort HEART: regular rate & rhythm ABDOMEN:  normoactive bowel sounds , non tender, not distended. No palpable hepatosplenomegaly.  Extremity: no pedal edema PSYCH: alert & oriented x 3 with fluent speech NEURO: no focal motor/sensory deficits  LABORATORY DATA:  I have reviewed the data as listed  . CBC Latest Ref Rng & Units 03/05/2019 02/12/2019 01/18/2019  WBC 4.0 - 10.5 K/uL 10.2 11.2(H) 4.5  Hemoglobin 13.0 - 17.0 g/dL 13.0 13.9 15.0  Hematocrit 39.0 - 52.0 % 37.5(L) 38.9(L) 41.8  Platelets 150 - 400 K/uL 318 410(H) 218    . CMP Latest Ref Rng & Units 03/05/2019 02/12/2019 01/18/2019  Glucose 70 - 99 mg/dL 135(H) 108(H) 96  BUN 8 - 23 mg/dL 14 14 18   Creatinine 0.61 - 1.24 mg/dL 1.00 0.98 0.93  Sodium 135 - 145 mmol/L 137 138 140  Potassium 3.5 - 5.1 mmol/L 4.3 4.6 4.1  Chloride 98 - 111 mmol/L 105 104 105  CO2 22 - 32 mmol/L 24 25 26   Calcium 8.9 - 10.3 mg/dL 9.2 10.1 9.1  Total Protein 6.5 - 8.1 g/dL 7.4 7.8 7.2  Total Bilirubin 0.3 - 1.2 mg/dL 0.5 0.3 0.5  Alkaline Phos 38 - 126 U/L 70 68 46  AST 15 - 41 U/L 16 14(L) 19  ALT 0 - 44  U/L 16 14 17     12/25/18 Left Retroperitoneal Biopsy:    RADIOGRAPHIC STUDIES: I have personally reviewed the radiological images as listed and agreed with the findings in the report. No results found.  ASSESSMENT & PLAN:  63 y.o. male with  1. Recenty diagnosed Diffuse Large B-Cell Lymphoma, Stage III-A Labs upon initial presentation from 12/25/18, blood counts normal including WBC at 5.3k, HGB at 14.9 and PLT at 220k 12/25/18 Left retroperitoneal biopsy revealed Diffuse Large B-Cell Lymphoma 12/19/18 PET/CT revealed "Hypermetabolic adenopathy within the chest, abdomen, and upper pelvis, most consistent with a lymphoproliferative process. 2. Aortic Atherosclerosis." 01/01/19 Hep B and Hep C negative  01/04/19 ECHO revealed LV EF of 40-45%, moderately increased left ventricular wall thickness, impaired relaxation, and moderate hypokinesis of LV, entire inferior wall and apical segment. Discussed that the general treatment recommendation is up to 6 cycles of R-CHOP. However, given his cardiac considerations I would recommend substituting Etoposide for Doxorubicin for R-CEOP regimen to limit risk of worsening heart failure. Discussed that this choice can decrease treatment efficacy but would aim to preserve heart function. Pt strongly prefers outpatient treatment and to avoid anthracycline in any  form, which I also recommend.   PLAN:   -Discussed pt labwork today, 03/05/19; all values are WNL except for RBC at 4.04, HCT at 37.5, Neutro Abs at 8.8K, Glucose at 135. -Discussed Rx for constipation if symptoms worsen -The pt has no prohibitive toxicities from beginning C3 R-CEOP with G-CSF support, at this time. -Will aim to repeat PET/CT before C4 and plan to treat for up to 6 cycles if well tolerated -Will need to watch fluid status throughout treatment -Will schedule PET scan in 2 weeks -Will see the pt back in 3 weeks  FOLLOW UP: PET/CT in 2 weeks F/u as scheduled for C4 of R-CEOP as  scheduled in 3 weeks  The total time spent in the appt was 25 minutes and more than 50% was on counseling and direct patient cares.  All of the patient's questions were answered with apparent satisfaction. The patient knows to call the clinic with any problems, questions or concerns.   Sullivan Lone MD [place] AAHIVMS [facility] [place] Hematology/Oncology Physician [facility]  (Office):       [phone] (Work cell):  [phone] (Fax):           [phone]  03/05/2019 11:31 AM  I, Yevette Edwards, am acting as a scribe for Dr. Sullivan Lone.   .I have reviewed the above documentation for accuracy and completeness, and I agree with the above. Brunetta Genera MD

## 2019-03-05 ENCOUNTER — Telehealth: Payer: Self-pay | Admitting: Hematology

## 2019-03-05 ENCOUNTER — Inpatient Hospital Stay: Payer: BC Managed Care – PPO

## 2019-03-05 ENCOUNTER — Other Ambulatory Visit: Payer: Self-pay

## 2019-03-05 ENCOUNTER — Inpatient Hospital Stay (HOSPITAL_BASED_OUTPATIENT_CLINIC_OR_DEPARTMENT_OTHER): Payer: BC Managed Care – PPO | Admitting: Hematology

## 2019-03-05 VITALS — BP 143/96 | HR 71 | Temp 98.7°F | Resp 18 | Ht 70.0 in | Wt 229.8 lb

## 2019-03-05 VITALS — BP 110/77 | HR 61 | Temp 97.6°F | Resp 18

## 2019-03-05 DIAGNOSIS — Z7189 Other specified counseling: Secondary | ICD-10-CM

## 2019-03-05 DIAGNOSIS — Z95828 Presence of other vascular implants and grafts: Secondary | ICD-10-CM

## 2019-03-05 DIAGNOSIS — C8338 Diffuse large B-cell lymphoma, lymph nodes of multiple sites: Secondary | ICD-10-CM | POA: Diagnosis not present

## 2019-03-05 DIAGNOSIS — Z5111 Encounter for antineoplastic chemotherapy: Secondary | ICD-10-CM | POA: Diagnosis not present

## 2019-03-05 LAB — CMP (CANCER CENTER ONLY)
ALT: 16 U/L (ref 0–44)
AST: 16 U/L (ref 15–41)
Albumin: 3.8 g/dL (ref 3.5–5.0)
Alkaline Phosphatase: 70 U/L (ref 38–126)
Anion gap: 8 (ref 5–15)
BUN: 14 mg/dL (ref 8–23)
CO2: 24 mmol/L (ref 22–32)
Calcium: 9.2 mg/dL (ref 8.9–10.3)
Chloride: 105 mmol/L (ref 98–111)
Creatinine: 1 mg/dL (ref 0.61–1.24)
GFR, Est AFR Am: >60 mL/min (ref >60)
GFR, Estimated: >60 mL/min (ref >60)
Glucose, Bld: 135 mg/dL — ABNORMAL HIGH (ref 70–99)
Potassium: 4.3 mmol/L (ref 3.5–5.1)
Sodium: 137 mmol/L (ref 135–145)
Total Bilirubin: 0.5 mg/dL (ref 0.3–1.2)
Total Protein: 7.4 g/dL (ref 6.5–8.1)

## 2019-03-05 LAB — CBC WITH DIFFERENTIAL (CANCER CENTER ONLY)
Abs Immature Granulocytes: 0.07 10*3/uL (ref 0.00–0.07)
Basophils Absolute: 0 10*3/uL (ref 0.0–0.1)
Basophils Relative: 0 %
Eosinophils Absolute: 0 10*3/uL (ref 0.0–0.5)
Eosinophils Relative: 0 %
HCT: 37.5 % — ABNORMAL LOW (ref 39.0–52.0)
Hemoglobin: 13 g/dL (ref 13.0–17.0)
Immature Granulocytes: 1 %
Lymphocytes Relative: 9 %
Lymphs Abs: 0.9 10*3/uL (ref 0.7–4.0)
MCH: 32.2 pg (ref 26.0–34.0)
MCHC: 34.7 g/dL (ref 30.0–36.0)
MCV: 92.8 fL (ref 80.0–100.0)
Monocytes Absolute: 0.3 10*3/uL (ref 0.1–1.0)
Monocytes Relative: 3 %
Neutro Abs: 8.8 10*3/uL — ABNORMAL HIGH (ref 1.7–7.7)
Neutrophils Relative %: 87 %
Platelet Count: 318 10*3/uL (ref 150–400)
RBC: 4.04 MIL/uL — ABNORMAL LOW (ref 4.22–5.81)
RDW: 13.2 % (ref 11.5–15.5)
WBC Count: 10.2 10*3/uL (ref 4.0–10.5)
nRBC: 0 % (ref 0.0–0.2)

## 2019-03-05 MED ORDER — DEXAMETHASONE SODIUM PHOSPHATE 10 MG/ML IJ SOLN
10.0000 mg | Freq: Once | INTRAMUSCULAR | Status: AC
  Administered 2019-03-05: 10 mg via INTRAVENOUS

## 2019-03-05 MED ORDER — PALONOSETRON HCL INJECTION 0.25 MG/5ML
0.2500 mg | Freq: Once | INTRAVENOUS | Status: AC
  Administered 2019-03-05: 0.25 mg via INTRAVENOUS

## 2019-03-05 MED ORDER — SODIUM CHLORIDE 0.9% FLUSH
10.0000 mL | Freq: Once | INTRAVENOUS | Status: AC
  Administered 2019-03-05: 10 mL
  Filled 2019-03-05: qty 10

## 2019-03-05 MED ORDER — DIPHENHYDRAMINE HCL 25 MG PO CAPS
50.0000 mg | ORAL_CAPSULE | Freq: Once | ORAL | Status: AC
  Administered 2019-03-05: 50 mg via ORAL

## 2019-03-05 MED ORDER — VINCRISTINE SULFATE CHEMO INJECTION 1 MG/ML
2.0000 mg | Freq: Once | INTRAVENOUS | Status: AC
  Administered 2019-03-05: 2 mg via INTRAVENOUS
  Filled 2019-03-05: qty 2

## 2019-03-05 MED ORDER — ACETAMINOPHEN 325 MG PO TABS
ORAL_TABLET | ORAL | Status: AC
  Filled 2019-03-05: qty 2

## 2019-03-05 MED ORDER — HEPARIN SOD (PORK) LOCK FLUSH 100 UNIT/ML IV SOLN
500.0000 [IU] | Freq: Once | INTRAVENOUS | Status: AC | PRN
  Administered 2019-03-05: 500 [IU]
  Filled 2019-03-05: qty 5

## 2019-03-05 MED ORDER — SODIUM CHLORIDE 0.9 % IV SOLN
100.0000 mg/m2 | Freq: Once | INTRAVENOUS | Status: AC
  Administered 2019-03-05: 220 mg via INTRAVENOUS
  Filled 2019-03-05: qty 11

## 2019-03-05 MED ORDER — SODIUM CHLORIDE 0.9 % IV SOLN
375.0000 mg/m2 | Freq: Once | INTRAVENOUS | Status: AC
  Administered 2019-03-05: 800 mg via INTRAVENOUS
  Filled 2019-03-05: qty 50

## 2019-03-05 MED ORDER — SODIUM CHLORIDE 0.9 % IV SOLN
Freq: Once | INTRAVENOUS | Status: AC
  Administered 2019-03-05: 12:00:00 via INTRAVENOUS
  Filled 2019-03-05: qty 250

## 2019-03-05 MED ORDER — DIPHENHYDRAMINE HCL 25 MG PO CAPS
ORAL_CAPSULE | ORAL | Status: AC
  Filled 2019-03-05: qty 2

## 2019-03-05 MED ORDER — SODIUM CHLORIDE 0.9% FLUSH
10.0000 mL | INTRAVENOUS | Status: DC | PRN
  Administered 2019-03-05: 10 mL
  Filled 2019-03-05: qty 10

## 2019-03-05 MED ORDER — ACETAMINOPHEN 325 MG PO TABS
650.0000 mg | ORAL_TABLET | Freq: Once | ORAL | Status: AC
  Administered 2019-03-05: 12:00:00 650 mg via ORAL

## 2019-03-05 MED ORDER — DEXAMETHASONE SODIUM PHOSPHATE 10 MG/ML IJ SOLN
INTRAMUSCULAR | Status: AC
  Filled 2019-03-05: qty 1

## 2019-03-05 MED ORDER — SODIUM CHLORIDE 0.9 % IV SOLN
750.0000 mg/m2 | Freq: Once | INTRAVENOUS | Status: AC
  Administered 2019-03-05: 1660 mg via INTRAVENOUS
  Filled 2019-03-05: qty 83

## 2019-03-05 MED ORDER — PALONOSETRON HCL INJECTION 0.25 MG/5ML
INTRAVENOUS | Status: AC
  Filled 2019-03-05: qty 5

## 2019-03-05 NOTE — Telephone Encounter (Signed)
Scheduled appt per 8/25 los

## 2019-03-05 NOTE — Patient Instructions (Signed)
[facility] Discharge Instructions for Patients Receiving Chemotherapy  Today you received the following chemotherapy agents: vincristine, cytoxan, etoposide, rituxan.  To help prevent nausea and vomiting after your treatment, we encourage you to take your nausea medication as prescribed.   If you develop nausea and vomiting that is not controlled by your nausea medication, call the clinic.   BELOW ARE SYMPTOMS THAT SHOULD BE REPORTED IMMEDIATELY:  *FEVER GREATER THAN 100.5 F  *CHILLS WITH OR WITHOUT FEVER  NAUSEA AND VOMITING THAT IS NOT CONTROLLED WITH YOUR NAUSEA MEDICATION  *UNUSUAL SHORTNESS OF BREATH  *UNUSUAL BRUISING OR BLEEDING  TENDERNESS IN MOUTH AND THROAT WITH OR WITHOUT PRESENCE OF ULCERS  *URINARY PROBLEMS  *BOWEL PROBLEMS  UNUSUAL RASH Items with * indicate a potential emergency and should be followed up as soon as possible.  Feel free to call the clinic should you have any questions or concerns. The clinic phone number is [phone].  Please show the CHEMO ALERT CARD at check-in to the Emergency Department and triage nurse.   

## 2019-03-06 ENCOUNTER — Other Ambulatory Visit: Payer: Self-pay | Admitting: Hematology

## 2019-03-06 ENCOUNTER — Inpatient Hospital Stay: Payer: BC Managed Care – PPO

## 2019-03-06 ENCOUNTER — Other Ambulatory Visit: Payer: Self-pay

## 2019-03-06 VITALS — BP 116/77 | HR 62 | Temp 98.3°F | Resp 16

## 2019-03-06 DIAGNOSIS — C8338 Diffuse large B-cell lymphoma, lymph nodes of multiple sites: Secondary | ICD-10-CM

## 2019-03-06 DIAGNOSIS — Z7189 Other specified counseling: Secondary | ICD-10-CM

## 2019-03-06 DIAGNOSIS — Z5111 Encounter for antineoplastic chemotherapy: Secondary | ICD-10-CM | POA: Diagnosis not present

## 2019-03-06 MED ORDER — PROCHLORPERAZINE MALEATE 10 MG PO TABS
ORAL_TABLET | ORAL | Status: AC
  Filled 2019-03-06: qty 1

## 2019-03-06 MED ORDER — HEPARIN SOD (PORK) LOCK FLUSH 100 UNIT/ML IV SOLN
500.0000 [IU] | Freq: Once | INTRAVENOUS | Status: AC | PRN
  Administered 2019-03-06: 14:00:00 500 [IU]
  Filled 2019-03-06: qty 5

## 2019-03-06 MED ORDER — SODIUM CHLORIDE 0.9 % IV SOLN
100.0000 mg/m2 | Freq: Once | INTRAVENOUS | Status: AC
  Administered 2019-03-06: 220 mg via INTRAVENOUS
  Filled 2019-03-06: qty 11

## 2019-03-06 MED ORDER — SODIUM CHLORIDE 0.9% FLUSH
10.0000 mL | INTRAVENOUS | Status: DC | PRN
  Administered 2019-03-06: 14:00:00 10 mL
  Filled 2019-03-06: qty 10

## 2019-03-06 MED ORDER — PROCHLORPERAZINE MALEATE 10 MG PO TABS
10.0000 mg | ORAL_TABLET | Freq: Once | ORAL | Status: AC
  Administered 2019-03-06: 10 mg via ORAL

## 2019-03-06 MED ORDER — SODIUM CHLORIDE 0.9 % IV SOLN
Freq: Once | INTRAVENOUS | Status: AC
  Administered 2019-03-06: 12:00:00 via INTRAVENOUS
  Filled 2019-03-06: qty 250

## 2019-03-06 NOTE — Patient Instructions (Signed)
[facility] Discharge Instructions for Patients Receiving Chemotherapy  Today you received the following chemotherapy agents:  Etoposide.  To help prevent nausea and vomiting after your treatment, we encourage you to take your nausea medication as directed.   If you develop nausea and vomiting that is not controlled by your nausea medication, call the clinic.   BELOW ARE SYMPTOMS THAT SHOULD BE REPORTED IMMEDIATELY:  *FEVER GREATER THAN 100.5 F  *CHILLS WITH OR WITHOUT FEVER  NAUSEA AND VOMITING THAT IS NOT CONTROLLED WITH YOUR NAUSEA MEDICATION  *UNUSUAL SHORTNESS OF BREATH  *UNUSUAL BRUISING OR BLEEDING  TENDERNESS IN MOUTH AND THROAT WITH OR WITHOUT PRESENCE OF ULCERS  *URINARY PROBLEMS  *BOWEL PROBLEMS  UNUSUAL RASH Items with * indicate a potential emergency and should be followed up as soon as possible.  Feel free to call the clinic should you have any questions or concerns. The clinic phone number is [phone].  Please show the CHEMO ALERT CARD at check-in to the Emergency Department and triage nurse.   

## 2019-03-07 ENCOUNTER — Other Ambulatory Visit: Payer: Self-pay

## 2019-03-07 ENCOUNTER — Inpatient Hospital Stay: Payer: BC Managed Care – PPO

## 2019-03-07 VITALS — BP 126/82 | HR 62 | Temp 98.5°F | Resp 18

## 2019-03-07 DIAGNOSIS — Z7189 Other specified counseling: Secondary | ICD-10-CM

## 2019-03-07 DIAGNOSIS — Z5111 Encounter for antineoplastic chemotherapy: Secondary | ICD-10-CM | POA: Diagnosis not present

## 2019-03-07 DIAGNOSIS — C8338 Diffuse large B-cell lymphoma, lymph nodes of multiple sites: Secondary | ICD-10-CM

## 2019-03-07 MED ORDER — SODIUM CHLORIDE 0.9% FLUSH
10.0000 mL | INTRAVENOUS | Status: DC | PRN
  Administered 2019-03-07: 10 mL
  Filled 2019-03-07: qty 10

## 2019-03-07 MED ORDER — SODIUM CHLORIDE 0.9 % IV SOLN
Freq: Once | INTRAVENOUS | Status: AC
  Administered 2019-03-07: 12:00:00 via INTRAVENOUS
  Filled 2019-03-07: qty 250

## 2019-03-07 MED ORDER — SODIUM CHLORIDE 0.9 % IV SOLN
100.0000 mg/m2 | Freq: Once | INTRAVENOUS | Status: AC
  Administered 2019-03-07: 220 mg via INTRAVENOUS
  Filled 2019-03-07: qty 11

## 2019-03-07 MED ORDER — PROCHLORPERAZINE MALEATE 10 MG PO TABS
10.0000 mg | ORAL_TABLET | Freq: Once | ORAL | Status: AC
  Administered 2019-03-07: 10 mg via ORAL

## 2019-03-07 MED ORDER — HEPARIN SOD (PORK) LOCK FLUSH 100 UNIT/ML IV SOLN
500.0000 [IU] | Freq: Once | INTRAVENOUS | Status: AC | PRN
  Administered 2019-03-07: 14:00:00 500 [IU]
  Filled 2019-03-07: qty 5

## 2019-03-07 MED ORDER — PROCHLORPERAZINE MALEATE 10 MG PO TABS
ORAL_TABLET | ORAL | Status: AC
  Filled 2019-03-07: qty 1

## 2019-03-07 NOTE — Patient Instructions (Signed)
[facility] Discharge Instructions for Patients Receiving Chemotherapy  Today you received the following chemotherapy agents:  Etoposide.  To help prevent nausea and vomiting after your treatment, we encourage you to take your nausea medication as directed.   If you develop nausea and vomiting that is not controlled by your nausea medication, call the clinic.   BELOW ARE SYMPTOMS THAT SHOULD BE REPORTED IMMEDIATELY:  *FEVER GREATER THAN 100.5 F  *CHILLS WITH OR WITHOUT FEVER  NAUSEA AND VOMITING THAT IS NOT CONTROLLED WITH YOUR NAUSEA MEDICATION  *UNUSUAL SHORTNESS OF BREATH  *UNUSUAL BRUISING OR BLEEDING  TENDERNESS IN MOUTH AND THROAT WITH OR WITHOUT PRESENCE OF ULCERS  *URINARY PROBLEMS  *BOWEL PROBLEMS  UNUSUAL RASH Items with * indicate a potential emergency and should be followed up as soon as possible.  Feel free to call the clinic should you have any questions or concerns. The clinic phone number is [phone].  Please show the CHEMO ALERT CARD at check-in to the Emergency Department and triage nurse.   

## 2019-03-08 ENCOUNTER — Inpatient Hospital Stay: Payer: BC Managed Care – PPO

## 2019-03-08 VITALS — BP 125/86 | HR 63 | Temp 98.6°F | Resp 17

## 2019-03-08 DIAGNOSIS — Z7189 Other specified counseling: Secondary | ICD-10-CM

## 2019-03-08 DIAGNOSIS — Z5111 Encounter for antineoplastic chemotherapy: Secondary | ICD-10-CM | POA: Diagnosis not present

## 2019-03-08 DIAGNOSIS — C8338 Diffuse large B-cell lymphoma, lymph nodes of multiple sites: Secondary | ICD-10-CM

## 2019-03-08 MED ORDER — PEGFILGRASTIM-CBQV 6 MG/0.6ML ~~LOC~~ SOSY
PREFILLED_SYRINGE | SUBCUTANEOUS | Status: AC
  Filled 2019-03-08: qty 0.6

## 2019-03-08 MED ORDER — PEGFILGRASTIM-CBQV 6 MG/0.6ML ~~LOC~~ SOSY
6.0000 mg | PREFILLED_SYRINGE | Freq: Once | SUBCUTANEOUS | Status: AC
  Administered 2019-03-08: 6 mg via SUBCUTANEOUS

## 2019-03-08 NOTE — Patient Instructions (Signed)

## 2019-03-12 ENCOUNTER — Other Ambulatory Visit: Payer: Self-pay | Admitting: Hematology

## 2019-03-12 ENCOUNTER — Telehealth: Payer: Self-pay | Admitting: *Deleted

## 2019-03-12 DIAGNOSIS — C8338 Diffuse large B-cell lymphoma, lymph nodes of multiple sites: Secondary | ICD-10-CM

## 2019-03-12 DIAGNOSIS — Z7189 Other specified counseling: Secondary | ICD-10-CM

## 2019-03-12 NOTE — Telephone Encounter (Signed)
Patient called to confirm that Dr. Irene Limbo ordered a PET scan for him - stated no one had called him. Advised him that PET ordered 03/05/2019 has not been authorized at this time. Once authorized, someone will contact him to schedule. He verbalized understanding.

## 2019-03-15 ENCOUNTER — Telehealth: Payer: Self-pay | Admitting: *Deleted

## 2019-03-15 NOTE — Telephone Encounter (Signed)
Patient called - left voice mail: He is filing for disability and "they have not heard from the cancer doctor". Attempted to contact patient - LVM on named VM: Advised that he contact [place] as that is the department at [facility] that will be responding to records request. They will be able to assist him.

## 2019-03-25 NOTE — Progress Notes (Signed)
HEMATOLOGY/ONCOLOGY CLINIC NOTE  Date of Service: 03/26/2019  Patient Care Team: Angelina Sheriff, MD as PCP - General (Family Medicine)  CHIEF COMPLAINTS/PURPOSE OF CONSULTATION:  Continue mx of  Diffuse Large B-Cell Lymphoma  HISTORY OF PRESENTING ILLNESS:   William Contreras is a wonderful 63 y.o. male who has been referred to [place] by Dr. Lanelle Bal in Cardiothoracic surgery for evaluation and management of his Newly diagnosed Diffuse Large B-Cell Lymphoma. The pt reports that he is doing well overall.   The pt reports that he has had a history of CABG x2 alongside replacing his aortic valve in 2018. His last ECHO was in November 2019 which revealed an LV EF of 45%. He was seen to have enlarged lymph nodes incidentally on a follow up CT with his cardiothoracic surgeon in May 2020. He denies fevers, chills, night sweats, unexpected weight loss. He has not noticed any new lumps or bumps and denies feeling differently presently as compared to 6 months ago. He denies any limiting symptoms when walking or with mild exercise. He denies any CHF exacerbations. The pt also notes a history of HLD and notes that he took statins for a little while which cause numbness and tingling in his toes and made him "feel mentally affected." He notes that he has well controlled HTN and endorses much improved GERD.  The pt notes that he does not have sleep apnea, there was once concern, but he lost weight to address this concern.  Of note prior to the patient's visit today, pt has had a PET/CT completed on 12/19/18 with results revealing "Hypermetabolic adenopathy within the chest, abdomen, and upper pelvis, most consistent with a lymphoproliferative process. 2. Aortic Atherosclerosis."   Most recent lab results (12/25/18) of CBC is as follows: all values are WNL.  On review of systems, pt reports stable energy levels, stable weight, and denies fevers, chills, night sweats, unexpected weight loss, new  lumps or bumps, difficulty breathing, SOB, CP, problems swallowing, dental concerns, painful teeth, recent infections, pain along the spine, abdominal pains, leg swelling, and any other symptoms.   On PMHx the pt reports s/p replacement of ascending aorta and aortic valve in 2018, HLD, HTN, CHF. On Social Hx the pt reports that he quit smoking cigarettes more than 20 years ago and denies any alcohol consumption. Works as a Environmental education officer. On Family Hx the pt reports brother with unspecified leukemia, father killed when pt was 48 years old. Mother well and living in 60s without medical concerns.  Interval History:   William Contreras returns today for management and evaluation of his recently diagnosed Diffuse Large B-Cell Lymphoma. The patient's last visit with [place] was on 03/05/2019. The pt reports that he is doing well overall.  The pt reports no new concerns. Pt is able to have regular bowel movements as long as he takes his Colace before treatment. Pt reports that he is experiencing some acid reflux that is constant but varies in intensity. It does not affect his eating at this time.  He would like to request his medical records so that he can send them off to file for disability.   Lab results today (03/26/19) of CBC w/diff and CMP is as follows: all values are WNL except for RBC at 3.60, Hgb at 11.7, HCT at 34.2, Glucose at 120, AST at 14.   On review of systems, pt reports acid reflux and denies N/V/D, constipation, chest pain, SOB, fevers, chills, night sweats, new  lumps/pumps, mouth sores, back pain and any other symptoms.   MEDICAL HISTORY:  Past Medical History:  Diagnosis Date   CHF (congestive heart failure) (HCC)    Coronary artery disease    Essential hypertension    GERD (gastroesophageal reflux disease)    Heart murmur    Morbid obesity ([place])    Neuromuscular disorder ([place])    burning in both feet - pt. thinks its related to statin    Sleep apnea    told that he was  borderline, but he has lost 50 lbs & is feeling much better     SURGICAL HISTORY: Past Surgical History:  Procedure Laterality Date   AORTIC VALVE REPLACEMENT N/A 03/14/2017   Procedure: AORTIC VALVE REPLACEMENT (AVR);  Surgeon: Grace Isaac, MD;  Location: [facility];  Service: Open Heart Surgery;  Laterality: N/A;  Using 26mm Edwards Magna Ease Bioprosthetic Aortic Valve   CARDIAC CATHETERIZATION N/A 03/10/2015   Procedure: Right/Left Heart Cath and Coronary Angiography;  Surgeon: Larey Dresser, MD;  Location: [facility];  Service: Cardiovascular;  Laterality: N/A;   CARDIAC CATHETERIZATION N/A 03/10/2015   Procedure: Coronary Stent Intervention;  Surgeon: Leonie Man, MD;  Location: [facility];  Service: Cardiovascular;  Laterality: N/A;   CORONARY ARTERY BYPASS GRAFT N/A 03/14/2017   Procedure: CORONARY ARTERY BYPASS GRAFTING (CABG), ON PUMP, TIMES TWO, USING LEFT INTERNAL MAMMARY ARTERY AND ENDOSCOPICALLY HARVESTED LEFT GREATER SAPHENOUS VEIN;  Surgeon: Grace Isaac, MD;  Location: [facility];  Service: Open Heart Surgery;  Laterality: N/A;  LIMA to LAD SVG to RAMUS INTERMEDIATE   IR IMAGING GUIDED PORT INSERTION  01/08/2019   REPLACEMENT ASCENDING AORTA N/A 03/14/2017   Procedure: REPLACEMENT ASCENDING AORTA WITH WHEAT PROCEDURE;  Surgeon: Grace Isaac, MD;  Location: [facility];  Service: Open Heart Surgery;  Laterality: N/A;  Using 35mm Hemashield Platinum Woven Double Velour Vascular Graft   TEE WITHOUT CARDIOVERSION N/A 03/14/2017   Procedure: TRANSESOPHAGEAL ECHOCARDIOGRAM (TEE);  Surgeon: Grace Isaac, MD;  Location: [facility];  Service: Open Heart Surgery;  Laterality: N/A;   TONSILLECTOMY      SOCIAL HISTORY: Social History   Socioeconomic History   Marital status: Married    Spouse name: Not on file   Number of children: Not on file   Years of education: Not on file   Highest education level: Not on file  Occupational History   Not on file    Social Needs   Financial resource strain: Not on file   Food insecurity    Worry: Not on file    Inability: Not on file   Transportation needs    Medical: Not on file    Non-medical: Not on file  Tobacco Use   Smoking status: Former Smoker    Packs/day: 1.00    Years: 15.00    Pack years: 15.00    Types: Cigarettes   Smokeless tobacco: Never Used   Tobacco comment: quit smoking ~ 20 yrs ago.  Substance and Sexual Activity   Alcohol use: No    Alcohol/week: 0.0 standard drinks    Comment: prev drank - none in ~ 25 yrs.   Drug use: No    Comment: prev smoked MJ, none in 25 yrs.   Sexual activity: Yes  Lifestyle   Physical activity    Days per week: Not on file    Minutes per session: Not on file   Stress: Not on file  Relationships  Social Herbalist on phone: Not on file    Gets together: Not on file    Attends religious service: Not on file    Active member of club or organization: Not on file    Attends meetings of clubs or organizations: Not on file    Relationship status: Not on file   Intimate partner violence    Fear of current or ex partner: Not on file    Emotionally abused: Not on file    Physically abused: Not on file    Forced sexual activity: Not on file  Other Topics Concern   Not on file  Social History Narrative   Pt lives in [place] with his wife and son.  He has 2 step children and another child - all grown and out of the house.    FAMILY HISTORY: Family History  Problem Relation Age of Onset   Other Father        died young in MVA.   Heart attack Mother        alive @ 24.   Other Brother        A & W   Other Sister        A & W    ALLERGIES:  is allergic to lipitor [atorvastatin] and penicillins.  MEDICATIONS:  Current Outpatient Medications  Medication Sig Dispense Refill   acetaminophen (TYLENOL) 325 MG tablet Take 2 tablets (650 mg total) by mouth every 6 (six) hours as needed for mild pain.      allopurinol (ZYLOPRIM) 300 MG tablet Take 1/2 (one-half) tablet by mouth once daily 30 tablet 1   aspirin EC 81 MG tablet Take 1 tablet (81 mg total) by mouth daily.     calcium citrate-vitamin D (CITRACAL+D) 315-200 MG-UNIT tablet Take 1 tablet by mouth daily.     carvedilol (COREG) 6.25 MG tablet Take 1 tablet by mouth twice daily 60 tablet 2   Coenzyme Q10 (COQ10) 400 MG CAPS Take 400 mg by mouth 2 (two) times daily.     Flax OIL Take 1 capsule by mouth daily.     Lactobacillus (ULTIMATE PROBIOTIC FORMULA) CAPS Take 1 capsule by mouth daily.      lidocaine-prilocaine (EMLA) cream Apply to affected area once 30 g 3   lisinopril (ZESTRIL) 5 MG tablet Take 1 tablet by mouth twice daily 60 tablet 2   LORazepam (ATIVAN) 0.5 MG tablet TAKE 1 TABLET BY MOUTH EVERY 6 HOURS AS NEEDED FOR NAUSEA FOR VOMITING 30 tablet 0   Magnesium 400 MG TABS Take 400 mg by mouth 2 (two) times daily.      Omega-3 Fatty Acids (FISH OIL CONCENTRATE PO) Take 1 capsule by mouth daily.     ondansetron (ZOFRAN) 8 MG tablet Take 1 tablet (8 mg total) by mouth 2 (two) times daily as needed for refractory nausea / vomiting. Start on day 3 after cyclophosphamide. 30 tablet 1   oxymetazoline (AFRIN) 0.05 % nasal spray Place 1 spray into both nostrils at bedtime as needed for congestion.     pantoprazole (PROTONIX) 40 MG tablet Take 1 tablet (40 mg total) by mouth daily. 30 tablet 1   predniSONE (DELTASONE) 20 MG tablet Take 3 tablets (60 mg total) by mouth daily. Take on days 1-5 of chemotherapy. 15 tablet 5   prochlorperazine (COMPAZINE) 10 MG tablet Take 1 tablet (10 mg total) by mouth every 6 (six) hours as needed (Nausea or vomiting). 30 tablet 1  spironolactone (ALDACTONE) 25 MG tablet Take 1/2 (one-half) tablet by mouth once daily 45 tablet 1   No current facility-administered medications for this visit.    Facility-Administered Medications Ordered in Other Visits  Medication Dose Route Frequency  Provider Last Rate Last Dose   cyclophosphamide (CYTOXAN) 1,660 mg in sodium chloride 0.9 % 250 mL chemo infusion  750 mg/m2 (Treatment Plan Recorded) Intravenous Once Brunetta Genera, MD       etoposide (VEPESID) 220 mg in sodium chloride 0.9 % 1,000 mL chemo infusion  100 mg/m2 (Treatment Plan Recorded) Intravenous Once Brunetta Genera, MD       heparin lock flush 100 unit/mL  500 Units Intracatheter Once PRN Brunetta Genera, MD       riTUXimab (RITUXAN) 800 mg in sodium chloride 0.9 % 170 mL infusion  375 mg/m2 (Treatment Plan Recorded) Intravenous Once Brunetta Genera, MD       sodium chloride flush (NS) 0.9 % injection 10 mL  10 mL Intracatheter PRN Brunetta Genera, MD       vinCRIStine (ONCOVIN) 2 mg in sodium chloride 0.9 % 50 mL chemo infusion  2 mg Intravenous Once Brunetta Genera, MD        REVIEW OF SYSTEMS:    A 10+ POINT REVIEW OF SYSTEMS WAS OBTAINED including neurology, dermatology, psychiatry, cardiac, respiratory, lymph, extremities, GI, GU, Musculoskeletal, constitutional, breasts, reproductive, HEENT.  All pertinent positives are noted in the HPI.  All others are negative.    PHYSICAL EXAMINATION: ECOG PERFORMANCE STATUS: 1 - Symptomatic but completely ambulatory  Vitals:   03/26/19 0912  BP: (!) 135/99  Pulse: 65  Resp: 18  Temp: 98 F (36.7 C)  SpO2: 100%   Filed Weights   03/26/19 0912  Weight: 230 lb 12.8 oz (104.7 kg)   .Body mass index is 33.12 kg/m.   GENERAL:alert, in no acute distress and comfortable SKIN: no acute rashes, no significant lesions EYES: conjunctiva are pink and non-injected, sclera anicteric OROPHARYNX: MMM, no exudates, no oropharyngeal erythema or ulceration NECK: supple, no JVD LYMPH:  no palpable lymphadenopathy in the cervical, axillary or inguinal regions LUNGS: clear to auscultation b/l with normal respiratory effort HEART: regular rate & rhythm ABDOMEN:  normoactive bowel sounds , non  tender, not distended. No palpable hepatosplenomegaly.  Extremity: no pedal edema PSYCH: alert & oriented x 3 with fluent speech NEURO: no focal motor/sensory deficits  LABORATORY DATA:  I have reviewed the data as listed  . CBC Latest Ref Rng & Units 03/26/2019 03/05/2019 02/12/2019  WBC 4.0 - 10.5 K/uL 8.1 10.2 11.2(H)  Hemoglobin 13.0 - 17.0 g/dL 11.7(L) 13.0 13.9  Hematocrit 39.0 - 52.0 % 34.2(L) 37.5(L) 38.9(L)  Platelets 150 - 400 K/uL 282 318 410(H)    . CMP Latest Ref Rng & Units 03/26/2019 03/05/2019 02/12/2019  Glucose 70 - 99 mg/dL 120(H) 135(H) 108(H)  BUN 8 - 23 mg/dL 16 14 14   Creatinine 0.61 - 1.24 mg/dL 0.92 1.00 0.98  Sodium 135 - 145 mmol/L 137 137 138  Potassium 3.5 - 5.1 mmol/L 4.2 4.3 4.6  Chloride 98 - 111 mmol/L 105 105 104  CO2 22 - 32 mmol/L 24 24 25   Calcium 8.9 - 10.3 mg/dL 9.3 9.2 10.1  Total Protein 6.5 - 8.1 g/dL 7.0 7.4 7.8  Total Bilirubin 0.3 - 1.2 mg/dL 0.4 0.5 0.3  Alkaline Phos 38 - 126 U/L 65 70 68  AST 15 - 41 U/L 14(L) 16 14(L)  ALT 0 -  44 U/L 13 16 14     12/25/18 Left Retroperitoneal Biopsy:    RADIOGRAPHIC STUDIES: I have personally reviewed the radiological images as listed and agreed with the findings in the report. No results found.  ASSESSMENT & PLAN:  63 y.o. male with  1. Recenty diagnosed Diffuse Large B-Cell Lymphoma, Stage III-A Labs upon initial presentation from 12/25/18, blood counts normal including WBC at 5.3k, HGB at 14.9 and PLT at 220k 12/25/18 Left retroperitoneal biopsy revealed Diffuse Large B-Cell Lymphoma 12/19/18 PET/CT revealed "Hypermetabolic adenopathy within the chest, abdomen, and upper pelvis, most consistent with a lymphoproliferative process. 2. Aortic Atherosclerosis." 01/01/19 Hep B and Hep C negative  01/04/19 ECHO revealed LV EF of 40-45%, moderately increased left ventricular wall thickness, impaired relaxation, and moderate hypokinesis of LV, entire inferior wall and apical segment. Discussed that the  general treatment recommendation is up to 6 cycles of R-CHOP. However, given his cardiac considerations I would recommend substituting Etoposide for Doxorubicin for R-CEOP regimen to limit risk of worsening heart failure. Discussed that this choice can decrease treatment efficacy but would aim to preserve heart function. Pt strongly prefers outpatient treatment and to avoid anthracycline in any form, which I also recommend.   PLAN:  -Discussed pt labwork today, 03/26/19; all values are WNL except for RBC at 3.60, Hgb at 11.7, HCT at 34.2, Glucose at 120, AST at 14.  -Recommended pt take an OTC acid suppressant if reflux begins to become bothersome or TUMS prn.  -The pt has no prohibitive toxicities from beginning C3 R-CEOP with G-CSF support, at this time. -rx plan reviewed and signed -Plan to treat for up to 6 cycles of R-CEOP if well tolerated -Will need to watch fluid status throughout treatment  FOLLOW UP: -PET/CT ASAP in 1-2 weeks (pending from last visit) -RTC for C5 of R-CEOP as scheduled starting on 10/6  The total time spent in the appt was 25 minutes and more than 50% was on counseling and direct patient cares.  All of the patient's questions were answered with apparent satisfaction. The patient knows to call the clinic with any problems, questions or concerns.    Sullivan Lone MD [place] AAHIVMS [facility] Hematology/Oncology Physician [facility]  (Office):       [phone] (Work cell):  [phone] (Fax):           [phone]  03/26/2019 10:16 AM  I, Yevette Edwards, am acting as a scribe for Dr. Sullivan Lone.   .I have reviewed the above documentation for accuracy and completeness, and I agree with the above. Brunetta Genera MD

## 2019-03-26 ENCOUNTER — Inpatient Hospital Stay: Payer: BC Managed Care – PPO

## 2019-03-26 ENCOUNTER — Inpatient Hospital Stay: Payer: BC Managed Care – PPO | Attending: Hematology

## 2019-03-26 ENCOUNTER — Inpatient Hospital Stay (HOSPITAL_BASED_OUTPATIENT_CLINIC_OR_DEPARTMENT_OTHER): Payer: BC Managed Care – PPO | Admitting: Hematology

## 2019-03-26 ENCOUNTER — Other Ambulatory Visit: Payer: Self-pay

## 2019-03-26 VITALS — BP 103/80 | HR 63 | Temp 98.3°F | Resp 16

## 2019-03-26 VITALS — BP 135/99 | HR 65 | Temp 98.0°F | Resp 18 | Ht 70.0 in | Wt 230.8 lb

## 2019-03-26 DIAGNOSIS — C8338 Diffuse large B-cell lymphoma, lymph nodes of multiple sites: Secondary | ICD-10-CM

## 2019-03-26 DIAGNOSIS — Z5111 Encounter for antineoplastic chemotherapy: Secondary | ICD-10-CM

## 2019-03-26 DIAGNOSIS — Z7189 Other specified counseling: Secondary | ICD-10-CM

## 2019-03-26 DIAGNOSIS — Z5112 Encounter for antineoplastic immunotherapy: Secondary | ICD-10-CM | POA: Diagnosis present

## 2019-03-26 DIAGNOSIS — Z5189 Encounter for other specified aftercare: Secondary | ICD-10-CM | POA: Insufficient documentation

## 2019-03-26 DIAGNOSIS — Z95828 Presence of other vascular implants and grafts: Secondary | ICD-10-CM

## 2019-03-26 LAB — CBC WITH DIFFERENTIAL/PLATELET
Abs Immature Granulocytes: 0.05 10*3/uL (ref 0.00–0.07)
Basophils Absolute: 0 10*3/uL (ref 0.0–0.1)
Basophils Relative: 0 %
Eosinophils Absolute: 0 10*3/uL (ref 0.0–0.5)
Eosinophils Relative: 0 %
HCT: 34.2 % — ABNORMAL LOW (ref 39.0–52.0)
Hemoglobin: 11.7 g/dL — ABNORMAL LOW (ref 13.0–17.0)
Immature Granulocytes: 1 %
Lymphocytes Relative: 10 %
Lymphs Abs: 0.8 10*3/uL (ref 0.7–4.0)
MCH: 32.5 pg (ref 26.0–34.0)
MCHC: 34.2 g/dL (ref 30.0–36.0)
MCV: 95 fL (ref 80.0–100.0)
Monocytes Absolute: 0.4 10*3/uL (ref 0.1–1.0)
Monocytes Relative: 4 %
Neutro Abs: 6.8 10*3/uL (ref 1.7–7.7)
Neutrophils Relative %: 85 %
Platelets: 282 10*3/uL (ref 150–400)
RBC: 3.6 MIL/uL — ABNORMAL LOW (ref 4.22–5.81)
RDW: 14.2 % (ref 11.5–15.5)
WBC: 8.1 10*3/uL (ref 4.0–10.5)
nRBC: 0 % (ref 0.0–0.2)

## 2019-03-26 LAB — CMP (CANCER CENTER ONLY)
ALT: 13 U/L (ref 0–44)
AST: 14 U/L — ABNORMAL LOW (ref 15–41)
Albumin: 3.9 g/dL (ref 3.5–5.0)
Alkaline Phosphatase: 65 U/L (ref 38–126)
Anion gap: 8 (ref 5–15)
BUN: 16 mg/dL (ref 8–23)
CO2: 24 mmol/L (ref 22–32)
Calcium: 9.3 mg/dL (ref 8.9–10.3)
Chloride: 105 mmol/L (ref 98–111)
Creatinine: 0.92 mg/dL (ref 0.61–1.24)
GFR, Est AFR Am: >60 mL/min (ref >60)
GFR, Estimated: >60 mL/min (ref >60)
Glucose, Bld: 120 mg/dL — ABNORMAL HIGH (ref 70–99)
Potassium: 4.2 mmol/L (ref 3.5–5.1)
Sodium: 137 mmol/L (ref 135–145)
Total Bilirubin: 0.4 mg/dL (ref 0.3–1.2)
Total Protein: 7 g/dL (ref 6.5–8.1)

## 2019-03-26 MED ORDER — DEXAMETHASONE SODIUM PHOSPHATE 10 MG/ML IJ SOLN
INTRAMUSCULAR | Status: AC
  Filled 2019-03-26: qty 1

## 2019-03-26 MED ORDER — ACETAMINOPHEN 325 MG PO TABS
ORAL_TABLET | ORAL | Status: AC
  Filled 2019-03-26: qty 2

## 2019-03-26 MED ORDER — DEXAMETHASONE SODIUM PHOSPHATE 10 MG/ML IJ SOLN
10.0000 mg | Freq: Once | INTRAMUSCULAR | Status: AC
  Administered 2019-03-26: 10:00:00 10 mg via INTRAVENOUS

## 2019-03-26 MED ORDER — DIPHENHYDRAMINE HCL 25 MG PO CAPS
ORAL_CAPSULE | ORAL | Status: AC
  Filled 2019-03-26: qty 2

## 2019-03-26 MED ORDER — HEPARIN SOD (PORK) LOCK FLUSH 100 UNIT/ML IV SOLN
500.0000 [IU] | Freq: Once | INTRAVENOUS | Status: AC | PRN
  Administered 2019-03-26: 500 [IU]
  Filled 2019-03-26: qty 5

## 2019-03-26 MED ORDER — PALONOSETRON HCL INJECTION 0.25 MG/5ML
0.2500 mg | Freq: Once | INTRAVENOUS | Status: AC
  Administered 2019-03-26: 0.25 mg via INTRAVENOUS

## 2019-03-26 MED ORDER — SODIUM CHLORIDE 0.9% FLUSH
10.0000 mL | INTRAVENOUS | Status: DC | PRN
  Administered 2019-03-26: 15:00:00 10 mL
  Filled 2019-03-26: qty 10

## 2019-03-26 MED ORDER — PALONOSETRON HCL INJECTION 0.25 MG/5ML
INTRAVENOUS | Status: AC
  Filled 2019-03-26: qty 5

## 2019-03-26 MED ORDER — SODIUM CHLORIDE 0.9 % IV SOLN
750.0000 mg/m2 | Freq: Once | INTRAVENOUS | Status: AC
  Administered 2019-03-26: 1660 mg via INTRAVENOUS
  Filled 2019-03-26: qty 83

## 2019-03-26 MED ORDER — ACETAMINOPHEN 325 MG PO TABS
650.0000 mg | ORAL_TABLET | Freq: Once | ORAL | Status: AC
  Administered 2019-03-26: 650 mg via ORAL

## 2019-03-26 MED ORDER — SODIUM CHLORIDE 0.9 % IV SOLN
100.0000 mg/m2 | Freq: Once | INTRAVENOUS | Status: AC
  Administered 2019-03-26: 220 mg via INTRAVENOUS
  Filled 2019-03-26: qty 11

## 2019-03-26 MED ORDER — SODIUM CHLORIDE 0.9 % IV SOLN
Freq: Once | INTRAVENOUS | Status: AC
  Administered 2019-03-26: 10:00:00 via INTRAVENOUS
  Filled 2019-03-26: qty 250

## 2019-03-26 MED ORDER — DIPHENHYDRAMINE HCL 25 MG PO CAPS
50.0000 mg | ORAL_CAPSULE | Freq: Once | ORAL | Status: AC
  Administered 2019-03-26: 10:00:00 50 mg via ORAL

## 2019-03-26 MED ORDER — SODIUM CHLORIDE 0.9 % IV SOLN
375.0000 mg/m2 | Freq: Once | INTRAVENOUS | Status: AC
  Administered 2019-03-26: 13:00:00 800 mg via INTRAVENOUS
  Filled 2019-03-26: qty 50

## 2019-03-26 MED ORDER — SODIUM CHLORIDE 0.9% FLUSH
10.0000 mL | Freq: Once | INTRAVENOUS | Status: AC
  Administered 2019-03-26: 10 mL
  Filled 2019-03-26: qty 10

## 2019-03-26 MED ORDER — VINCRISTINE SULFATE CHEMO INJECTION 1 MG/ML
2.0000 mg | Freq: Once | INTRAVENOUS | Status: AC
  Administered 2019-03-26: 2 mg via INTRAVENOUS
  Filled 2019-03-26: qty 2

## 2019-03-26 NOTE — Patient Instructions (Signed)
[facility] Discharge Instructions for Patients Receiving Chemotherapy  Today you received the following chemotherapy agents: Vincristine, Cyclophosphamide, Etoposide, Rituxan  To help prevent nausea and vomiting after your treatment, we encourage you to take your nausea medication as directed.   If you develop nausea and vomiting that is not controlled by your nausea medication, call the clinic.   BELOW ARE SYMPTOMS THAT SHOULD BE REPORTED IMMEDIATELY:  *FEVER GREATER THAN 100.5 F  *CHILLS WITH OR WITHOUT FEVER  NAUSEA AND VOMITING THAT IS NOT CONTROLLED WITH YOUR NAUSEA MEDICATION  *UNUSUAL SHORTNESS OF BREATH  *UNUSUAL BRUISING OR BLEEDING  TENDERNESS IN MOUTH AND THROAT WITH OR WITHOUT PRESENCE OF ULCERS  *URINARY PROBLEMS  *BOWEL PROBLEMS  UNUSUAL RASH Items with * indicate a potential emergency and should be followed up as soon as possible.  Feel free to call the clinic should you have any questions or concerns. The clinic phone number is [phone].  Please show the [facility] at check-in to the Emergency Department and triage nurse.

## 2019-03-27 ENCOUNTER — Inpatient Hospital Stay: Payer: BC Managed Care – PPO

## 2019-03-27 ENCOUNTER — Telehealth: Payer: Self-pay | Admitting: Hematology

## 2019-03-27 ENCOUNTER — Other Ambulatory Visit: Payer: Self-pay

## 2019-03-27 VITALS — BP 137/86 | HR 65 | Temp 98.5°F | Resp 17

## 2019-03-27 DIAGNOSIS — Z7189 Other specified counseling: Secondary | ICD-10-CM

## 2019-03-27 DIAGNOSIS — Z5111 Encounter for antineoplastic chemotherapy: Secondary | ICD-10-CM | POA: Diagnosis not present

## 2019-03-27 DIAGNOSIS — C8338 Diffuse large B-cell lymphoma, lymph nodes of multiple sites: Secondary | ICD-10-CM

## 2019-03-27 MED ORDER — PROCHLORPERAZINE MALEATE 10 MG PO TABS
ORAL_TABLET | ORAL | Status: AC
  Filled 2019-03-27: qty 1

## 2019-03-27 MED ORDER — PROCHLORPERAZINE MALEATE 10 MG PO TABS
10.0000 mg | ORAL_TABLET | Freq: Once | ORAL | Status: AC
  Administered 2019-03-27: 10 mg via ORAL

## 2019-03-27 MED ORDER — SODIUM CHLORIDE 0.9 % IV SOLN
100.0000 mg/m2 | Freq: Once | INTRAVENOUS | Status: AC
  Administered 2019-03-27: 12:00:00 220 mg via INTRAVENOUS
  Filled 2019-03-27: qty 11

## 2019-03-27 MED ORDER — SODIUM CHLORIDE 0.9% FLUSH
10.0000 mL | INTRAVENOUS | Status: DC | PRN
  Administered 2019-03-27: 10 mL
  Filled 2019-03-27: qty 10

## 2019-03-27 MED ORDER — HEPARIN SOD (PORK) LOCK FLUSH 100 UNIT/ML IV SOLN
500.0000 [IU] | Freq: Once | INTRAVENOUS | Status: AC | PRN
  Administered 2019-03-27: 500 [IU]
  Filled 2019-03-27: qty 5

## 2019-03-27 MED ORDER — SODIUM CHLORIDE 0.9 % IV SOLN
Freq: Once | INTRAVENOUS | Status: AC
  Administered 2019-03-27: 11:00:00 via INTRAVENOUS
  Filled 2019-03-27: qty 250

## 2019-03-27 NOTE — Patient Instructions (Signed)
[facility]charge Inst[facility] for Patients Receiving Chemotherapy  Today you received the following chemotherapy agents:  Etoposide.  To help prevent nausea and vomiting after your treatment, we encourage you to take your nausea medication as directed.   If you develop nausea and vomiting that is not controlled by your nausea medication, call the clinic.   BELOW ARE SYMPTOMS THAT SHOULD BE REPORTED IMMEDIATELY:  *FEVER GREATER THAN 100.5 F  *CHILLS WITH OR WITHOUT FEVER  NAUSEA AND VOMITING THAT IS NOT CONTROLLED WITH YOUR NAUSEA MEDICATION  *UNUSUAL SHORTNESS OF BREATH  *UNUSUAL BRUISING OR BLEEDING  TENDERNESS IN MOUTH AND THROAT WITH OR WITHOUT PRESENCE OF ULCERS  *URINARY PROBLEMS  *BOWEL PROBLEMS  UNUSUAL RASH Items with * indicate a potential emergency and should be followed up as soon as possible.  Feel free to call the clinic should you have any questions or concerns. The clinic phone number is [phone].  Please show the CHEMO ALERT CARD at check-in to the Emergency Department and triage nurse.   

## 2019-03-27 NOTE — Telephone Encounter (Signed)
Faxed records to [facility]

## 2019-03-27 NOTE — Telephone Encounter (Signed)
Patient already on schedule as requested per 9/15 los.

## 2019-03-28 ENCOUNTER — Other Ambulatory Visit: Payer: Self-pay

## 2019-03-28 ENCOUNTER — Inpatient Hospital Stay: Payer: BC Managed Care – PPO

## 2019-03-28 VITALS — BP 108/81 | HR 61 | Temp 98.4°F | Resp 18

## 2019-03-28 DIAGNOSIS — Z7189 Other specified counseling: Secondary | ICD-10-CM

## 2019-03-28 DIAGNOSIS — C8338 Diffuse large B-cell lymphoma, lymph nodes of multiple sites: Secondary | ICD-10-CM

## 2019-03-28 DIAGNOSIS — Z5111 Encounter for antineoplastic chemotherapy: Secondary | ICD-10-CM | POA: Diagnosis not present

## 2019-03-28 MED ORDER — SODIUM CHLORIDE 0.9 % IV SOLN
Freq: Once | INTRAVENOUS | Status: AC
  Administered 2019-03-28: 12:00:00 via INTRAVENOUS
  Filled 2019-03-28: qty 250

## 2019-03-28 MED ORDER — SODIUM CHLORIDE 0.9% FLUSH
10.0000 mL | INTRAVENOUS | Status: DC | PRN
  Administered 2019-03-28: 10 mL
  Filled 2019-03-28: qty 10

## 2019-03-28 MED ORDER — SODIUM CHLORIDE 0.9 % IV SOLN
100.0000 mg/m2 | Freq: Once | INTRAVENOUS | Status: AC
  Administered 2019-03-28: 220 mg via INTRAVENOUS
  Filled 2019-03-28: qty 11

## 2019-03-28 MED ORDER — HEPARIN SOD (PORK) LOCK FLUSH 100 UNIT/ML IV SOLN
500.0000 [IU] | Freq: Once | INTRAVENOUS | Status: AC | PRN
  Administered 2019-03-28: 14:00:00 500 [IU]
  Filled 2019-03-28: qty 5

## 2019-03-28 MED ORDER — PROCHLORPERAZINE MALEATE 10 MG PO TABS
10.0000 mg | ORAL_TABLET | Freq: Once | ORAL | Status: AC
  Administered 2019-03-28: 10 mg via ORAL

## 2019-03-28 MED ORDER — PROCHLORPERAZINE MALEATE 10 MG PO TABS
ORAL_TABLET | ORAL | Status: AC
  Filled 2019-03-28: qty 1

## 2019-03-28 NOTE — Patient Instructions (Signed)
[facility] Discharge Instructions for Patients Receiving Chemotherapy  Today you received the following chemotherapy agents: Etoposide.  To help prevent nausea and vomiting after your treatment, we encourage you to take your nausea medication as prescribed. If you develop nausea and vomiting that is not controlled by your nausea medication, call the clinic.   BELOW ARE SYMPTOMS THAT SHOULD BE REPORTED IMMEDIATELY:  *FEVER GREATER THAN 100.5 F  *CHILLS WITH OR WITHOUT FEVER  NAUSEA AND VOMITING THAT IS NOT CONTROLLED WITH YOUR NAUSEA MEDICATION  *UNUSUAL SHORTNESS OF BREATH  *UNUSUAL BRUISING OR BLEEDING  TENDERNESS IN MOUTH AND THROAT WITH OR WITHOUT PRESENCE OF ULCERS  *URINARY PROBLEMS  *BOWEL PROBLEMS  UNUSUAL RASH Items with * indicate a potential emergency and should be followed up as soon as possible.  Feel free to call the clinic should you have any questions or concerns. The clinic phone number is [phone].  Please show the CHEMO ALERT CARD at check-in to the Emergency Department and triage nurse.   

## 2019-03-29 ENCOUNTER — Other Ambulatory Visit: Payer: Self-pay

## 2019-03-29 ENCOUNTER — Inpatient Hospital Stay: Payer: BC Managed Care – PPO

## 2019-03-29 VITALS — BP 128/72 | HR 62 | Temp 98.4°F | Resp 18

## 2019-03-29 DIAGNOSIS — C8338 Diffuse large B-cell lymphoma, lymph nodes of multiple sites: Secondary | ICD-10-CM

## 2019-03-29 DIAGNOSIS — Z7189 Other specified counseling: Secondary | ICD-10-CM

## 2019-03-29 DIAGNOSIS — Z5111 Encounter for antineoplastic chemotherapy: Secondary | ICD-10-CM | POA: Diagnosis not present

## 2019-03-29 MED ORDER — PEGFILGRASTIM-CBQV 6 MG/0.6ML ~~LOC~~ SOSY
6.0000 mg | PREFILLED_SYRINGE | Freq: Once | SUBCUTANEOUS | Status: AC
  Administered 2019-03-29: 6 mg via SUBCUTANEOUS

## 2019-03-29 MED ORDER — PEGFILGRASTIM-CBQV 6 MG/0.6ML ~~LOC~~ SOSY
PREFILLED_SYRINGE | SUBCUTANEOUS | Status: AC
  Filled 2019-03-29: qty 0.6

## 2019-04-09 ENCOUNTER — Ambulatory Visit (HOSPITAL_COMMUNITY)
Admission: RE | Admit: 2019-04-09 | Discharge: 2019-04-09 | Disposition: A | Discharge status: Home / Self Care | Payer: BC Managed Care – PPO | Source: Ambulatory Visit | Attending: Hematology | Admitting: Hematology

## 2019-04-09 ENCOUNTER — Other Ambulatory Visit: Payer: Self-pay

## 2019-04-09 DIAGNOSIS — C8338 Diffuse large B-cell lymphoma, lymph nodes of multiple sites: Secondary | ICD-10-CM | POA: Diagnosis not present

## 2019-04-09 DIAGNOSIS — Z5111 Encounter for antineoplastic chemotherapy: Secondary | ICD-10-CM

## 2019-04-09 LAB — GLUCOSE, CAPILLARY: Glucose-Capillary: 110 mg/dL — ABNORMAL HIGH (ref 70–99)

## 2019-04-09 MED ORDER — FLUDEOXYGLUCOSE F - 18 (FDG) INJECTION
10.5300 | Freq: Once | INTRAVENOUS | Status: AC | PRN
  Administered 2019-04-09: 13:00:00 10.53 via INTRAVENOUS

## 2019-04-13 ENCOUNTER — Other Ambulatory Visit: Payer: Self-pay | Admitting: Hematology

## 2019-04-13 DIAGNOSIS — Z7189 Other specified counseling: Secondary | ICD-10-CM

## 2019-04-13 DIAGNOSIS — C8338 Diffuse large B-cell lymphoma, lymph nodes of multiple sites: Secondary | ICD-10-CM

## 2019-04-15 NOTE — Progress Notes (Signed)
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 male who has been referred to [place] by Dr. Lanelle Bal in Cardiothoracic surgery for evaluation and management of his Newly diagnosed Diffuse Large B-Cell Lymphoma. The pt reports that he is doing well overall.   The pt reports that he has had a history of CABG x2 alongside replacing his aortic valve in 2018. His last ECHO was in November 2019 which revealed an LV EF of 45%. He was seen to have enlarged lymph nodes incidentally on a follow up CT with his cardiothoracic surgeon in May 2020. He denies fevers, chills, night sweats, unexpected weight loss. He has not noticed any new lumps or bumps and denies feeling differently presently as compared to 6 months ago. He denies any limiting symptoms when walking or with mild exercise. He denies any CHF exacerbations. The pt also notes a history of HLD and notes that he took statins for a little while which cause numbness and tingling in his toes and made him "feel mentally affected." He notes that he has well controlled HTN and endorses much improved GERD.  The pt notes that he does not have sleep apnea, there was once concern, but he lost weight to address this concern.  Of note prior to the patient's visit today, pt has had a PET/CT completed on 12/19/18 with results revealing "Hypermetabolic adenopathy within the chest, abdomen, and upper pelvis, most consistent with a lymphoproliferative process. 2. Aortic Atherosclerosis."   Most recent lab results (12/25/18) of CBC is as follows: all values are WNL.  On review of systems, pt reports stable energy levels, stable weight, and denies fevers, chills, night sweats, unexpected weight loss, new  lumps or bumps, difficulty breathing, SOB, CP, problems swallowing, dental concerns, painful teeth, recent infections, pain along the spine, abdominal pains, leg swelling, and any other symptoms.   On PMHx the pt reports s/p replacement of ascending aorta and aortic valve in 2018, HLD, HTN, CHF. On Social Hx the pt reports that he quit smoking cigarettes more than 20 years ago and denies any alcohol consumption. Works as a Environmental education officer. On Family Hx the pt reports brother with unspecified leukemia, father killed when pt was 18 years old. Mother well and living in 81s without medical concerns.  Interval History:   [name] returns today for management and evaluation of his recently diagnosed Diffuse Large B-Cell Lymphoma. The patient's last visit with [place] was on 03/26/2019. The pt reports that he is doing well overall.  The pt reports that he has been feeling good, eating well and trying to remain active. Pt is still experiencing mild reflux that is not too bothersome. He notes that the first week after treatment he is rather fatigued but he begins to feel more normal the following week. He does not feel that his fatigue is debilitating. Pt notes that he becomes lightheaded if he moves or sits up too fast. He has a BP machine at home and his BP is typically  within the normal range (ex. 120/80).   Of note since the patient's last visit, pt has had PET/CT Whole Body Scan ([id number]) completed on 04/09/2019 with results revealing "1. Essentially complete metabolic response. No new or residual hypermetabolic lymphadenopathy  in the chest, abdomen or pelvis. Deauville score 2.  Aortic Atherosclerosis (ICD10-I70.0)."  Lab results today (04/16/19) of CBC w/diff and CMP is as follows: all values are WNL except for RBC at 3.47, Hgb at 11.1, HCT at 33.3, Glucose at 145.  On review of systems, pt reports fatigue, reflux, lightheadedness and denies fevers, infection issues and any other symptoms.   MEDICAL  HISTORY:  Past Medical History:  Diagnosis Date  . CHF (congestive heart failure) ([place])   . Coronary artery disease   . Essential hypertension   . GERD (gastroesophageal reflux disease)   . Heart murmur   . Morbid obesity ([place])   . Neuromuscular disorder ([place])    burning in both feet - pt. thinks its related to statin   . Sleep apnea    told that he was borderline, but he has lost 50 lbs & is feeling much better     SURGICAL HISTORY: Past Surgical History:  Procedure Laterality Date  . AORTIC VALVE REPLACEMENT N/A 03/14/2017   Procedure: AORTIC VALVE REPLACEMENT (AVR);  Surgeon: Grace Isaac, MD;  Location: [facility];  Service: Open Heart Surgery;  Laterality: N/A;  Using 31mm Edwards Magna Ease Bioprosthetic Aortic Valve  . CARDIAC CATHETERIZATION N/A 03/10/2015   Procedure: Right/Left Heart Cath and Coronary Angiography;  Surgeon: Larey Dresser, MD;  Location: [facility];  Service: Cardiovascular;  Laterality: N/A;  . CARDIAC CATHETERIZATION N/A 03/10/2015   Procedure: Coronary Stent Intervention;  Surgeon: Leonie Man, MD;  Location: [facility];  Service: Cardiovascular;  Laterality: N/A;  . CORONARY ARTERY BYPASS GRAFT N/A 03/14/2017   Procedure: CORONARY ARTERY BYPASS GRAFTING (CABG), ON PUMP, TIMES TWO, USING LEFT INTERNAL MAMMARY ARTERY AND ENDOSCOPICALLY HARVESTED LEFT GREATER SAPHENOUS VEIN;  Surgeon: Grace Isaac, MD;  Location: [facility];  Service: Open Heart Surgery;  Laterality: N/A;  LIMA to LAD SVG to RAMUS INTERMEDIATE  . IR IMAGING GUIDED PORT INSERTION  01/08/2019  . REPLACEMENT ASCENDING AORTA N/A 03/14/2017   Procedure: REPLACEMENT ASCENDING AORTA WITH WHEAT PROCEDURE;  Surgeon: Grace Isaac, MD;  Location: [facility];  Service: Open Heart Surgery;  Laterality: N/A;  Using 17mm Hemashield Platinum Woven Double Velour Vascular Graft  . TEE WITHOUT CARDIOVERSION N/A 03/14/2017   Procedure: TRANSESOPHAGEAL ECHOCARDIOGRAM (TEE);  Surgeon: Grace Isaac, MD;  Location: [facility];  Service: Open Heart Surgery;  Laterality: N/A;  . TONSILLECTOMY      SOCIAL HISTORY: Social History   Socioeconomic History  . Marital status: Married    Spouse name: Not on file  . Number of children: Not on file  . Years of education: Not on file  . Highest education level: Not on file  Occupational History  . Not on file  Social Needs  . Financial resource strain: Not on file  . Food insecurity    Worry: Not on file    Inability: Not on file  . Transportation needs    Medical: Not on file    Non-medical: Not on file  Tobacco Use  . Smoking status: Former Smoker    Packs/day: 1.00    Years: 15.00    Pack years: 15.00    Types: Cigarettes  . Smokeless tobacco: Never Used  . Tobacco comment: quit smoking ~ 20 yrs ago.  Substance and Sexual Activity  . Alcohol use: No    Alcohol/week: 0.0 standard drinks    Comment: prev drank - none in ~ 25 yrs.  Marland Kitchen  Drug use: No    Comment: prev smoked MJ, none in 25 yrs.  . Sexual activity: Yes  Lifestyle  . Physical activity    Days per week: Not on file    Minutes per session: Not on file  . Stress: Not on file  Relationships  . Social Herbalist on phone: Not on file    Gets together: Not on file    Attends religious service: Not on file    Active member of club or organization: Not on file    Attends meetings of clubs or organizations: Not on file    Relationship status: Not on file  . Intimate partner violence    Fear of current or ex partner: Not on file    Emotionally abused: Not on file    Physically abused: Not on file    Forced sexual activity: Not on file  Other Topics Concern  . Not on file  Social History Narrative   Pt lives in [place] with his wife and son.  He has 2 step children and another child - all grown and out of the house.    FAMILY HISTORY: Family History  Problem Relation Age of Onset  . Other Father        died young in [place].  Marland Kitchen Heart attack  Mother        alive @ 81.  . Other Brother        A & W  . Other Sister        A & W    ALLERGIES:  is allergic to lipitor [atorvastatin] and penicillins.  MEDICATIONS:  Current Outpatient Medications  Medication Sig Dispense Refill  . acetaminophen (TYLENOL) 325 MG tablet Take 2 tablets (650 mg total) by mouth every 6 (six) hours as needed for mild pain.    Marland Kitchen allopurinol (ZYLOPRIM) 300 MG tablet Take 1/2 (one-half) tablet by mouth once daily 30 tablet 1  . aspirin EC 81 MG tablet Take 1 tablet (81 mg total) by mouth daily.    . calcium citrate-vitamin D (CITRACAL+D) 315-200 MG-UNIT tablet Take 1 tablet by mouth daily.    . carvedilol (COREG) 6.25 MG tablet Take 1 tablet by mouth twice daily 60 tablet 2  . Coenzyme Q10 (COQ10) 400 MG CAPS Take 400 mg by mouth 2 (two) times daily.    . Flax OIL Take 1 capsule by mouth daily.    . Lactobacillus (ULTIMATE PROBIOTIC FORMULA) CAPS Take 1 capsule by mouth daily.     Marland Kitchen lidocaine-prilocaine (EMLA) cream Apply to affected area once 30 g 3  . lisinopril (ZESTRIL) 5 MG tablet Take 1 tablet by mouth twice daily 60 tablet 2  . LORazepam (ATIVAN) 0.5 MG tablet TAKE 1 TABLET BY MOUTH EVERY 6 HOURS AS NEEDED FOR NAUSEA FOR VOMITING 30 tablet 0  . Magnesium 400 MG TABS Take 400 mg by mouth 2 (two) times daily.     . Omega-3 Fatty Acids (FISH OIL CONCENTRATE PO) Take 1 capsule by mouth daily.    . ondansetron (ZOFRAN) 8 MG tablet Take 1 tablet (8 mg total) by mouth 2 (two) times daily as needed for refractory nausea / vomiting. Start on day 3 after cyclophosphamide. 30 tablet 1  . oxymetazoline (AFRIN) 0.05 % nasal spray Place 1 spray into both nostrils at bedtime as needed for congestion.    . pantoprazole (PROTONIX) 40 MG tablet Take 1 tablet (40 mg total) by mouth daily. 30 tablet 1  .  predniSONE (DELTASONE) 20 MG tablet Take 3 tablets (60 mg total) by mouth daily. Take on days 1-5 of chemotherapy. 15 tablet 5  . prochlorperazine (COMPAZINE) 10 MG  tablet Take 1 tablet (10 mg total) by mouth every 6 (six) hours as needed (Nausea or vomiting). 30 tablet 1  . spironolactone (ALDACTONE) 25 MG tablet Take 1/2 (one-half) tablet by mouth once daily 45 tablet 1   No current facility-administered medications for this visit.     REVIEW OF SYSTEMS:    A 10+ POINT REVIEW OF SYSTEMS WAS OBTAINED including neurology, dermatology, psychiatry, cardiac, respiratory, lymph, extremities, GI, GU, Musculoskeletal, constitutional, breasts, reproductive, HEENT.  All pertinent positives are noted in the HPI.  All others are negative.   PHYSICAL EXAMINATION: ECOG PERFORMANCE STATUS: 1 - Symptomatic but completely ambulatory  Vitals:   04/16/19 1009  BP: (!) 154/82  Pulse: 79  Resp: 18  Temp: 98.5 F (36.9 C)  SpO2: 99%   Filed Weights   04/16/19 1009  Weight: 229 lb 8 oz (104.1 kg)   .Body mass index is 32.93 kg/m.   GENERAL:alert, in no acute distress and comfortable SKIN: no acute rashes, no significant lesions EYES: conjunctiva are pink and non-injected, sclera anicteric OROPHARYNX: MMM, no exudates, no oropharyngeal erythema or ulceration NECK: supple, no JVD LYMPH:  no palpable lymphadenopathy in the cervical, axillary or inguinal regions LUNGS: clear to auscultation b/l with normal respiratory effort HEART: regular rate & rhythm ABDOMEN:  normoactive bowel sounds , non tender, not distended. No palpable hepatosplenomegaly.  Extremity: no pedal edema PSYCH: alert & oriented x 3 with fluent speech NEURO: no focal motor/sensory deficits  LABORATORY DATA:  I have reviewed the data as listed  . CBC Latest Ref Rng & Units 04/16/2019 03/26/2019 03/05/2019  WBC 4.0 - 10.5 K/uL 7.2 8.1 10.2  Hemoglobin 13.0 - 17.0 g/dL 11.1(L) 11.7(L) 13.0  Hematocrit 39.0 - 52.0 % 33.3(L) 34.2(L) 37.5(L)  Platelets 150 - 400 K/uL 303 282 318    . CMP Latest Ref Rng & Units 03/26/2019 03/05/2019 02/12/2019  Glucose 70 - 99 mg/dL 120(H) 135(H) 108(H)  BUN  8 - 23 mg/dL 16 14 14   Creatinine 0.61 - 1.24 mg/dL 0.92 1.00 0.98  Sodium 135 - 145 mmol/L 137 137 138  Potassium 3.5 - 5.1 mmol/L 4.2 4.3 4.6  Chloride 98 - 111 mmol/L 105 105 104  CO2 22 - 32 mmol/L 24 24 25   Calcium 8.9 - 10.3 mg/dL 9.3 9.2 10.1  Total Protein 6.5 - 8.1 g/dL 7.0 7.4 7.8  Total Bilirubin 0.3 - 1.2 mg/dL 0.4 0.5 0.3  Alkaline Phos 38 - 126 U/L 65 70 68  AST 15 - 41 U/L 14(L) 16 14(L)  ALT 0 - 44 U/L 13 16 14     12/25/18 Left Retroperitoneal Biopsy:    RADIOGRAPHIC STUDIES: I have personally reviewed the radiological images as listed and agreed with the findings in the report. Nm Pet Image Restag (ps) Skull Base To Thigh  Result Date: 04/09/2019 CLINICAL DATA:  Subsequent treatment strategy for diffuse large B-cell lymphoma status post 3 cycles chemoimmunotherapy. EXAM: NUCLEAR MEDICINE PET SKULL BASE TO THIGH TECHNIQUE: 10.5 mCi F-18 FDG was injected intravenously. Full-ring PET imaging was performed from the skull base to thigh after the radiotracer. CT data was obtained and used for attenuation correction and anatomic localization. Fasting blood glucose: 110 mg/dl COMPARISON:  12/19/2018 PET-CT. FINDINGS: Mediastinal blood pool activity: SUV max 3.2 Liver activity: SUV max 4.8 NECK: No hypermetabolic lymph  nodes in the neck. Incidental CT findings: none CHEST: No new or residual enlarged or hypermetabolic lymph nodes in the mediastinum. Previously visualized bilateral paratracheal hypermetabolic lymphadenopathy has resolved. No hypermetabolic pulmonary findings. Incidental CT findings: Coronary atherosclerosis status post CABG. Aortic valve prosthesis in place. Right internal jugular Port-A-Cath terminates at the cavoatrial junction. Atherosclerotic nonaneurysmal thoracic aorta. No acute consolidative airspace disease, lung masses or significant pulmonary nodules. Intact appearing sternotomy wires. ABDOMEN/PELVIS: No new or residual hypermetabolic lymph nodes in the abdomen  or pelvis. No residual hypermetabolism within the previously noted hypermetabolic gastrohepatic ligament, porta hepatis, aortocaval, left periaortic or common iliac lymph nodes, which have all decreased in size. Representative 0.9 cm aortocaval node with max SUV 2.6 (series 4/image 135), previously 2.5 cm with max SUV 27.5. No measurable uptake within the otherwise subcentimeter retroperitoneal lymph nodes. No abnormal hypermetabolic activity within the liver, pancreas, adrenal glands, or spleen. Incidental CT findings: Atherosclerotic nonaneurysmal abdominal aorta. Moderate left colonic diverticulosis. Mildly enlarged prostate. SKELETON: No focal hypermetabolic activity to suggest skeletal metastasis. New diffuse hypermetabolism throughout the marrow of axial and proximal appendicular skeleton. Incidental CT findings: none IMPRESSION: 1. Essentially complete metabolic response. No new or residual hypermetabolic lymphadenopathy in the chest, abdomen or pelvis. Deauville score 2. 2.  Aortic Atherosclerosis (ICD10-I70.0). Electronically Signed   By: Ilona Sorrel M.D.   On: 04/09/2019 14:55    ASSESSMENT & PLAN:  63 y.o. male with  1. Recenty diagnosed Diffuse Large B-Cell Lymphoma, Stage III-A Labs upon initial presentation from 12/25/18, blood counts normal including WBC at 5.3k, HGB at 14.9 and PLT at 220k 12/25/18 Left retroperitoneal biopsy revealed Diffuse Large B-Cell Lymphoma 12/19/18 PET/CT revealed "Hypermetabolic adenopathy within the chest, abdomen, and upper pelvis, most consistent with a lymphoproliferative process. 2. Aortic Atherosclerosis." 01/01/19 Hep B and Hep C negative  01/04/19 ECHO revealed LV EF of 40-45%, moderately increased left ventricular wall thickness, impaired relaxation, and moderate hypokinesis of LV, entire inferior wall and apical segment. Discussed that the general treatment recommendation is up to 6 cycles of R-CHOP. However, given his cardiac considerations I would  recommend substituting Etoposide for Doxorubicin for R-CEOP regimen to limit risk of worsening heart failure. Discussed that this choice can decrease treatment efficacy but would aim to preserve heart function. Pt strongly prefers outpatient treatment and to avoid anthracycline in any form, which I also recommend.   PLAN:  -Discussed pt labwork today, 04/16/19; all values are WNL except for RBC at 3.47, Hgb at 11.1, HCT at 33.3, Glucose at 145. -Discussed 04/09/2019 PET/CT Whole Body Scan ([id number]) which revealed "1. Essentially complete metabolic response. No new or residual hypermetabolic lymphadenopathy in the chest, abdomen or pelvis. Deauville score 2.  Aortic Atherosclerosis (ICD10-I70.0)." -Advised pt that if his BP remains in the normal range he may have to discuss use of Lisinopril with prescribing physician  -Recommended pt take an OTC acid suppressant if reflux begins to become bothersome or TUMS prn.  -The pt has no prohibitive toxicities from beginning C5 R-CEOP with G-CSF support, at this time. -Rx plan reviewed and signed -Plan to treat for up to 6 cycles of R-CEOP -Will need to watch fluid status throughout treatment -Will see back in 3 weeks with labs   FOLLOW UP: Plz schedule C6 of chemotherapy as ordered with labs and MD visit on C6D1.  The total time spent in the appt was 25 minutes and more than 50% was on counseling and direct patient cares.  All of the patient's questions were  answered with apparent satisfaction. The patient knows to call the clinic with any problems, questions or concerns.   Sullivan Lone MD [place] AAHIVMS [facility] Hematology/Oncology Physician [facility]  (Office):       [phone] (Work cell):  [phone] (Fax):           [phone]  04/16/2019 9:57 AM  I, Yevette Edwards, am acting as a scribe for Dr. Sullivan Lone.   .I have reviewed the above documentation for accuracy and completeness, and I agree with the above. Brunetta Genera MD

## 2019-04-16 ENCOUNTER — Inpatient Hospital Stay: Payer: BC Managed Care – PPO | Attending: Hematology

## 2019-04-16 ENCOUNTER — Other Ambulatory Visit: Payer: Self-pay

## 2019-04-16 ENCOUNTER — Telehealth: Payer: Self-pay | Admitting: Hematology

## 2019-04-16 ENCOUNTER — Inpatient Hospital Stay: Payer: BC Managed Care – PPO

## 2019-04-16 ENCOUNTER — Inpatient Hospital Stay (HOSPITAL_BASED_OUTPATIENT_CLINIC_OR_DEPARTMENT_OTHER): Payer: BC Managed Care – PPO | Admitting: Hematology

## 2019-04-16 VITALS — BP 108/59 | HR 65 | Temp 98.5°F | Resp 18

## 2019-04-16 VITALS — BP 154/82 | HR 79 | Temp 98.5°F | Resp 18 | Ht 70.0 in | Wt 229.5 lb

## 2019-04-16 DIAGNOSIS — C8338 Diffuse large B-cell lymphoma, lymph nodes of multiple sites: Secondary | ICD-10-CM

## 2019-04-16 DIAGNOSIS — Z5112 Encounter for antineoplastic immunotherapy: Secondary | ICD-10-CM | POA: Insufficient documentation

## 2019-04-16 DIAGNOSIS — Z7189 Other specified counseling: Secondary | ICD-10-CM

## 2019-04-16 DIAGNOSIS — Z95828 Presence of other vascular implants and grafts: Secondary | ICD-10-CM

## 2019-04-16 DIAGNOSIS — Z5189 Encounter for other specified aftercare: Secondary | ICD-10-CM | POA: Insufficient documentation

## 2019-04-16 DIAGNOSIS — Z5111 Encounter for antineoplastic chemotherapy: Secondary | ICD-10-CM

## 2019-04-16 LAB — CMP (CANCER CENTER ONLY)
ALT: 15 U/L (ref 0–44)
AST: 17 U/L (ref 15–41)
Albumin: 3.8 g/dL (ref 3.5–5.0)
Alkaline Phosphatase: 59 U/L (ref 38–126)
Anion gap: 10 (ref 5–15)
BUN: 13 mg/dL (ref 8–23)
CO2: 23 mmol/L (ref 22–32)
Calcium: 9.4 mg/dL (ref 8.9–10.3)
Chloride: 104 mmol/L (ref 98–111)
Creatinine: 0.95 mg/dL (ref 0.61–1.24)
GFR, Est AFR Am: >60 mL/min (ref >60)
GFR, Estimated: >60 mL/min (ref >60)
Glucose, Bld: 145 mg/dL — ABNORMAL HIGH (ref 70–99)
Potassium: 4.1 mmol/L (ref 3.5–5.1)
Sodium: 137 mmol/L (ref 135–145)
Total Bilirubin: 0.5 mg/dL (ref 0.3–1.2)
Total Protein: 7 g/dL (ref 6.5–8.1)

## 2019-04-16 LAB — CBC WITH DIFFERENTIAL/PLATELET
Abs Immature Granulocytes: 0.04 10*3/uL (ref 0.00–0.07)
Basophils Absolute: 0 10*3/uL (ref 0.0–0.1)
Basophils Relative: 0 %
Eosinophils Absolute: 0 10*3/uL (ref 0.0–0.5)
Eosinophils Relative: 1 %
HCT: 33.3 % — ABNORMAL LOW (ref 39.0–52.0)
Hemoglobin: 11.1 g/dL — ABNORMAL LOW (ref 13.0–17.0)
Immature Granulocytes: 1 %
Lymphocytes Relative: 12 %
Lymphs Abs: 0.9 10*3/uL (ref 0.7–4.0)
MCH: 32 pg (ref 26.0–34.0)
MCHC: 33.3 g/dL (ref 30.0–36.0)
MCV: 96 fL (ref 80.0–100.0)
Monocytes Absolute: 0.3 10*3/uL (ref 0.1–1.0)
Monocytes Relative: 4 %
Neutro Abs: 5.9 10*3/uL (ref 1.7–7.7)
Neutrophils Relative %: 82 %
Platelets: 303 10*3/uL (ref 150–400)
RBC: 3.47 MIL/uL — ABNORMAL LOW (ref 4.22–5.81)
RDW: 14.7 % (ref 11.5–15.5)
WBC: 7.2 10*3/uL (ref 4.0–10.5)
nRBC: 0 % (ref 0.0–0.2)

## 2019-04-16 MED ORDER — SODIUM CHLORIDE 0.9 % IV SOLN
375.0000 mg/m2 | Freq: Once | INTRAVENOUS | Status: AC
  Administered 2019-04-16: 800 mg via INTRAVENOUS
  Filled 2019-04-16: qty 30

## 2019-04-16 MED ORDER — DIPHENHYDRAMINE HCL 25 MG PO CAPS
50.0000 mg | ORAL_CAPSULE | Freq: Once | ORAL | Status: AC
  Administered 2019-04-16: 50 mg via ORAL

## 2019-04-16 MED ORDER — HEPARIN SOD (PORK) LOCK FLUSH 100 UNIT/ML IV SOLN
500.0000 [IU] | Freq: Once | INTRAVENOUS | Status: AC | PRN
  Administered 2019-04-16: 500 [IU]
  Filled 2019-04-16: qty 5

## 2019-04-16 MED ORDER — PALONOSETRON HCL INJECTION 0.25 MG/5ML
INTRAVENOUS | Status: AC
  Filled 2019-04-16: qty 5

## 2019-04-16 MED ORDER — SODIUM CHLORIDE 0.9% FLUSH
10.0000 mL | INTRAVENOUS | Status: DC | PRN
  Administered 2019-04-16: 10 mL
  Filled 2019-04-16: qty 10

## 2019-04-16 MED ORDER — ACETAMINOPHEN 325 MG PO TABS
650.0000 mg | ORAL_TABLET | Freq: Once | ORAL | Status: AC
  Administered 2019-04-16: 650 mg via ORAL

## 2019-04-16 MED ORDER — PALONOSETRON HCL INJECTION 0.25 MG/5ML
0.2500 mg | Freq: Once | INTRAVENOUS | Status: AC
  Administered 2019-04-16: 0.25 mg via INTRAVENOUS

## 2019-04-16 MED ORDER — SODIUM CHLORIDE 0.9 % IV SOLN
750.0000 mg/m2 | Freq: Once | INTRAVENOUS | Status: AC
  Administered 2019-04-16: 1660 mg via INTRAVENOUS
  Filled 2019-04-16: qty 83

## 2019-04-16 MED ORDER — DIPHENHYDRAMINE HCL 25 MG PO CAPS
ORAL_CAPSULE | ORAL | Status: AC
  Filled 2019-04-16: qty 2

## 2019-04-16 MED ORDER — ACETAMINOPHEN 325 MG PO TABS
ORAL_TABLET | ORAL | Status: AC
  Filled 2019-04-16: qty 2

## 2019-04-16 MED ORDER — DEXAMETHASONE SODIUM PHOSPHATE 10 MG/ML IJ SOLN
10.0000 mg | Freq: Once | INTRAMUSCULAR | Status: AC
  Administered 2019-04-16: 11:00:00 10 mg via INTRAVENOUS

## 2019-04-16 MED ORDER — SODIUM CHLORIDE 0.9% FLUSH
10.0000 mL | Freq: Once | INTRAVENOUS | Status: AC
  Administered 2019-04-16: 10 mL
  Filled 2019-04-16: qty 10

## 2019-04-16 MED ORDER — DEXAMETHASONE SODIUM PHOSPHATE 10 MG/ML IJ SOLN
INTRAMUSCULAR | Status: AC
  Filled 2019-04-16: qty 1

## 2019-04-16 MED ORDER — VINCRISTINE SULFATE CHEMO INJECTION 1 MG/ML
2.0000 mg | Freq: Once | INTRAVENOUS | Status: AC
  Administered 2019-04-16: 2 mg via INTRAVENOUS
  Filled 2019-04-16: qty 2

## 2019-04-16 MED ORDER — SODIUM CHLORIDE 0.9 % IV SOLN
Freq: Once | INTRAVENOUS | Status: AC
  Administered 2019-04-16: 11:00:00 via INTRAVENOUS
  Filled 2019-04-16: qty 250

## 2019-04-16 MED ORDER — SODIUM CHLORIDE 0.9 % IV SOLN
100.0000 mg/m2 | Freq: Once | INTRAVENOUS | Status: AC
  Administered 2019-04-16: 14:00:00 220 mg via INTRAVENOUS
  Filled 2019-04-16: qty 11

## 2019-04-16 NOTE — Telephone Encounter (Signed)
Scheduled appt per 10/6 los.  Patient will get a print out at his next treatment appt.

## 2019-04-16 NOTE — Patient Instructions (Signed)
[facility] Discharge Instructions for Patients Receiving Chemotherapy  Today you received the following chemotherapy agents Vincristine, Cytoxan, Etoposide, Rituximab.   To help prevent nausea and vomiting after your treatment, we encourage you to take your nausea medication as directed.  If you develop nausea and vomiting that is not controlled by your nausea medication, call the clinic.   BELOW ARE SYMPTOMS THAT SHOULD BE REPORTED IMMEDIATELY:  *FEVER GREATER THAN 100.5 F  *CHILLS WITH OR WITHOUT FEVER  NAUSEA AND VOMITING THAT IS NOT CONTROLLED WITH YOUR NAUSEA MEDICATION  *UNUSUAL SHORTNESS OF BREATH  *UNUSUAL BRUISING OR BLEEDING  TENDERNESS IN MOUTH AND THROAT WITH OR WITHOUT PRESENCE OF ULCERS  *URINARY PROBLEMS  *BOWEL PROBLEMS  UNUSUAL RASH Items with * indicate a potential emergency and should be followed up as soon as possible.  Feel free to call the clinic should you have any questions or concerns. The clinic phone number is [phone].  Please show the [facility] at check-in to the Emergency Department and triage nurse.

## 2019-04-17 ENCOUNTER — Inpatient Hospital Stay: Payer: BC Managed Care – PPO

## 2019-04-17 ENCOUNTER — Other Ambulatory Visit: Payer: Self-pay

## 2019-04-17 VITALS — BP 132/93 | HR 66 | Temp 98.9°F | Resp 20

## 2019-04-17 DIAGNOSIS — Z7189 Other specified counseling: Secondary | ICD-10-CM

## 2019-04-17 DIAGNOSIS — Z5111 Encounter for antineoplastic chemotherapy: Secondary | ICD-10-CM | POA: Diagnosis not present

## 2019-04-17 DIAGNOSIS — C8338 Diffuse large B-cell lymphoma, lymph nodes of multiple sites: Secondary | ICD-10-CM

## 2019-04-17 MED ORDER — PROCHLORPERAZINE MALEATE 10 MG PO TABS
10.0000 mg | ORAL_TABLET | Freq: Once | ORAL | Status: AC
  Administered 2019-04-17: 10 mg via ORAL

## 2019-04-17 MED ORDER — SODIUM CHLORIDE 0.9 % IV SOLN
Freq: Once | INTRAVENOUS | Status: AC
  Administered 2019-04-17: 12:00:00 via INTRAVENOUS
  Filled 2019-04-17: qty 250

## 2019-04-17 MED ORDER — HEPARIN SOD (PORK) LOCK FLUSH 100 UNIT/ML IV SOLN
500.0000 [IU] | Freq: Once | INTRAVENOUS | Status: AC | PRN
  Administered 2019-04-17: 500 [IU]
  Filled 2019-04-17: qty 5

## 2019-04-17 MED ORDER — PROCHLORPERAZINE MALEATE 10 MG PO TABS
ORAL_TABLET | ORAL | Status: AC
  Filled 2019-04-17: qty 1

## 2019-04-17 MED ORDER — SODIUM CHLORIDE 0.9% FLUSH
10.0000 mL | INTRAVENOUS | Status: DC | PRN
  Administered 2019-04-17: 14:00:00 10 mL
  Filled 2019-04-17: qty 10

## 2019-04-17 MED ORDER — SODIUM CHLORIDE 0.9 % IV SOLN
100.0000 mg/m2 | Freq: Once | INTRAVENOUS | Status: AC
  Administered 2019-04-17: 220 mg via INTRAVENOUS
  Filled 2019-04-17: qty 11

## 2019-04-17 NOTE — Patient Instructions (Signed)
[facility] Discharge Instructions for Patients Receiving Chemotherapy  Today you received the following chemotherapy agents:  Etoposide.  To help prevent nausea and vomiting after your treatment, we encourage you to take your nausea medication as directed.   If you develop nausea and vomiting that is not controlled by your nausea medication, call the clinic.   BELOW ARE SYMPTOMS THAT SHOULD BE REPORTED IMMEDIATELY:  *FEVER GREATER THAN 100.5 F  *CHILLS WITH OR WITHOUT FEVER  NAUSEA AND VOMITING THAT IS NOT CONTROLLED WITH YOUR NAUSEA MEDICATION  *UNUSUAL SHORTNESS OF BREATH  *UNUSUAL BRUISING OR BLEEDING  TENDERNESS IN MOUTH AND THROAT WITH OR WITHOUT PRESENCE OF ULCERS  *URINARY PROBLEMS  *BOWEL PROBLEMS  UNUSUAL RASH Items with * indicate a potential emergency and should be followed up as soon as possible.  Feel free to call the clinic should you have any questions or concerns. The clinic phone number is [phone].  Please show the CHEMO ALERT CARD at check-in to the Emergency Department and triage nurse.   

## 2019-04-18 ENCOUNTER — Other Ambulatory Visit: Payer: Self-pay

## 2019-04-18 ENCOUNTER — Inpatient Hospital Stay: Payer: BC Managed Care – PPO

## 2019-04-18 VITALS — BP 119/68 | HR 69 | Temp 98.3°F | Resp 16

## 2019-04-18 DIAGNOSIS — Z7189 Other specified counseling: Secondary | ICD-10-CM

## 2019-04-18 DIAGNOSIS — Z5111 Encounter for antineoplastic chemotherapy: Secondary | ICD-10-CM | POA: Diagnosis not present

## 2019-04-18 DIAGNOSIS — C8338 Diffuse large B-cell lymphoma, lymph nodes of multiple sites: Secondary | ICD-10-CM

## 2019-04-18 MED ORDER — PROCHLORPERAZINE MALEATE 10 MG PO TABS
10.0000 mg | ORAL_TABLET | Freq: Once | ORAL | Status: AC
  Administered 2019-04-18: 13:00:00 10 mg via ORAL

## 2019-04-18 MED ORDER — SODIUM CHLORIDE 0.9 % IV SOLN
100.0000 mg/m2 | Freq: Once | INTRAVENOUS | Status: AC
  Administered 2019-04-18: 220 mg via INTRAVENOUS
  Filled 2019-04-18: qty 11

## 2019-04-18 MED ORDER — SODIUM CHLORIDE 0.9% FLUSH
10.0000 mL | INTRAVENOUS | Status: DC | PRN
  Administered 2019-04-18: 10 mL
  Filled 2019-04-18: qty 10

## 2019-04-18 MED ORDER — SODIUM CHLORIDE 0.9 % IV SOLN
Freq: Once | INTRAVENOUS | Status: AC
  Administered 2019-04-18: 13:00:00 via INTRAVENOUS
  Filled 2019-04-18: qty 250

## 2019-04-18 MED ORDER — HEPARIN SOD (PORK) LOCK FLUSH 100 UNIT/ML IV SOLN
500.0000 [IU] | Freq: Once | INTRAVENOUS | Status: AC | PRN
  Administered 2019-04-18: 500 [IU]
  Filled 2019-04-18: qty 5

## 2019-04-18 NOTE — Patient Instructions (Addendum)
[facility] Discharge Instructions for Patients Receiving Chemotherapy  Today you received the following chemotherapy agents: Etoposide (VEPESID).  To help prevent nausea and vomiting after your treatment, we encourage you to take your nausea medication as directed.   If you develop nausea and vomiting that is not controlled by your nausea medication, call the clinic.   BELOW ARE SYMPTOMS THAT SHOULD BE REPORTED IMMEDIATELY:  *FEVER GREATER THAN 100.5 F  *CHILLS WITH OR WITHOUT FEVER  NAUSEA AND VOMITING THAT IS NOT CONTROLLED WITH YOUR NAUSEA MEDICATION  *UNUSUAL SHORTNESS OF BREATH  *UNUSUAL BRUISING OR BLEEDING  TENDERNESS IN MOUTH AND THROAT WITH OR WITHOUT PRESENCE OF ULCERS  *URINARY PROBLEMS  *BOWEL PROBLEMS  UNUSUAL RASH Items with * indicate a potential emergency and should be followed up as soon as possible.  Feel free to call the clinic should you have any questions or concerns. The clinic phone number is [phone].  Please show the [facility] at check-in to the Emergency Department and triage nurse.  Coronavirus (COVID-19) Are you at risk?  Are you at risk for the Coronavirus (COVID-19)?  To be considered HIGH RISK for Coronavirus (COVID-19), you have to meet the following criteria:  . Traveled to [place], [place], [place], [place] or [place]; or in the [place] to [place], [place], [place], or [place]; and have fever, cough, and shortness of breath within the last 2 weeks of travel OR . Been in close contact with a person diagnosed with COVID-19 within the last 2 weeks and have fever, cough, and shortness of breath . IF YOU DO NOT MEET THESE CRITERIA, YOU ARE CONSIDERED LOW RISK FOR COVID-19.  What to do if you are HIGH RISK for COVID-19?  Marland Kitchen If you are having a medical emergency, call 911. . Seek medical care right away. Before you go to a doctor's office, urgent care or emergency department, call ahead and tell them  about your recent travel, contact with someone diagnosed with COVID-19, and your symptoms. You should receive instructions from your physician's office regarding next steps of care.  . When you arrive at healthcare provider, tell the healthcare staff immediately you have returned from visiting [place], [place], [place], [place] or [place]; or traveled in the [place] to [place], [place], [place], or [place]; in the last two weeks or you have been in close contact with a person diagnosed with COVID-19 in the last 2 weeks.   . Tell the health care staff about your symptoms: fever, cough and shortness of breath. . After you have been seen by a medical provider, you will be either: o Tested for (COVID-19) and discharged home on quarantine except to seek medical care if symptoms worsen, and asked to  - Stay home and avoid contact with others until you get your results (4-5 days)  - Avoid travel on public transportation if possible (such as bus, train, or airplane) or o Sent to the Emergency Department by EMS for evaluation, COVID-19 testing, and possible admission depending on your condition and test results.  What to do if you are LOW RISK for COVID-19?  Reduce your risk of any infection by using the same precautions used for avoiding the common cold or flu:  Marland Kitchen Wash your hands often with soap and warm water for at least 20 seconds.  If soap and water are not readily available, use an alcohol-based hand sanitizer with at least 60% alcohol.  . If coughing or  sneezing, cover your mouth and nose by coughing or sneezing into the elbow areas of your shirt or coat, into a tissue or into your sleeve (not your hands). . Avoid shaking hands with others and consider head nods or verbal greetings only. . Avoid touching your eyes, nose, or mouth with unwashed hands.  . Avoid close contact with people who are sick. . Avoid places or events with large numbers of people in one location, like concerts or  sporting events. . Carefully consider travel plans you have or are making. . If you are planning any travel outside or inside the [place], visit the CDC's Travelers' Health webpage for the latest health notices. . If you have some symptoms but not all symptoms, continue to monitor at home and seek medical attention if your symptoms worsen. . If you are having a medical emergency, call 911.   [place] / e-Visit: [email or web address]         [facility]: [facility]: [phone]                   [facility]: [phone]

## 2019-04-19 ENCOUNTER — Other Ambulatory Visit: Payer: Self-pay

## 2019-04-19 ENCOUNTER — Inpatient Hospital Stay: Payer: BC Managed Care – PPO

## 2019-04-19 VITALS — HR 77 | Temp 98.5°F | Resp 16

## 2019-04-19 DIAGNOSIS — Z5111 Encounter for antineoplastic chemotherapy: Secondary | ICD-10-CM | POA: Diagnosis not present

## 2019-04-19 DIAGNOSIS — C8338 Diffuse large B-cell lymphoma, lymph nodes of multiple sites: Secondary | ICD-10-CM

## 2019-04-19 DIAGNOSIS — Z7189 Other specified counseling: Secondary | ICD-10-CM

## 2019-04-19 MED ORDER — PEGFILGRASTIM-CBQV 6 MG/0.6ML ~~LOC~~ SOSY
PREFILLED_SYRINGE | SUBCUTANEOUS | Status: AC
  Filled 2019-04-19: qty 0.6

## 2019-04-19 MED ORDER — PEGFILGRASTIM-CBQV 6 MG/0.6ML ~~LOC~~ SOSY
6.0000 mg | PREFILLED_SYRINGE | Freq: Once | SUBCUTANEOUS | Status: AC
  Administered 2019-04-19: 14:00:00 6 mg via SUBCUTANEOUS

## 2019-04-19 NOTE — Patient Instructions (Signed)

## 2019-05-04 ENCOUNTER — Other Ambulatory Visit: Payer: Self-pay | Admitting: Hematology

## 2019-05-04 DIAGNOSIS — Z7189 Other specified counseling: Secondary | ICD-10-CM

## 2019-05-04 DIAGNOSIS — C8338 Diffuse large B-cell lymphoma, lymph nodes of multiple sites: Secondary | ICD-10-CM

## 2019-05-06 NOTE — Progress Notes (Signed)
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 male who has been referred to [place] by Dr. Lanelle Bal in Cardiothoracic surgery for evaluation and management of his Newly diagnosed Diffuse Large B-Cell Lymphoma. The pt reports that he is doing well overall.   The pt reports that he has had a history of CABG x2 alongside replacing his aortic valve in 2018. His last ECHO was in November 2019 which revealed an LV EF of 45%. He was seen to have enlarged lymph nodes incidentally on a follow up CT with his cardiothoracic surgeon in May 2020. He denies fevers, chills, night sweats, unexpected weight loss. He has not noticed any new lumps or bumps and denies feeling differently presently as compared to 6 months ago. He denies any limiting symptoms when walking or with mild exercise. He denies any CHF exacerbations. The pt also notes a history of HLD and notes that he took statins for a little while which cause numbness and tingling in his toes and made him "feel mentally affected." He notes that he has well controlled HTN and endorses much improved GERD.  The pt notes that he does not have sleep apnea, there was once concern, but he lost weight to address this concern.  Of note prior to the patient's visit today, pt has had a PET/CT completed on 12/19/18 with results revealing "Hypermetabolic adenopathy within the chest, abdomen, and upper pelvis, most consistent with a lymphoproliferative process. 2. Aortic Atherosclerosis."   Most recent lab results (12/25/18) of CBC is as follows: all values are WNL.  On review of systems, pt reports stable energy levels, stable weight, and denies fevers, chills, night sweats, unexpected weight loss, new  lumps or bumps, difficulty breathing, SOB, CP, problems swallowing, dental concerns, painful teeth, recent infections, pain along the spine, abdominal pains, leg swelling, and any other symptoms.   On PMHx the pt reports s/p replacement of ascending aorta and aortic valve in 2018, HLD, HTN, CHF. On Social Hx the pt reports that he quit smoking cigarettes more than 20 years ago and denies any alcohol consumption. Works as a Environmental education officer. On Family Hx the pt reports brother with unspecified leukemia, father killed when pt was 23 years old. Mother well and living in 21s without medical concerns.  Interval History:   William Contreras returns today for management and evaluation of his recently diagnosed Diffuse Large B-Cell Lymphoma. He is here for  C6 R-CEOP with G-CSF support. The patient's last visit with [place] was on 04/16/2019. The pt reports that he is doing well overall.  The pt reports that he is beginning to feel like himself again and has had no new concerns. He has been eating well. He is not interested in an annual flu vaccine as he believes that they've made him sick before. Pt is unsure if he's had his pneumonia vaccines in the last 5 years.   Lab results today (05/07/19) of CBC w/diff and CMP is as follows: all values are WNL except for RBC at 3.44, Hgb at 11.0, HCT at 32.9, Glucose at 113. 05/07/2019 LDH at 187  On review of systems, pt reports eating well, some discomfort over the port and denies N/V/D, skin rashes, infection issues, numbness/tingling in hands/feet  and any other symptoms.   MEDICAL HISTORY:  Past Medical History:  Diagnosis Date  . CHF (congestive heart failure) ([facility])   . Coronary artery disease   . Essential hypertension   . GERD (gastroesophageal reflux disease)   . Heart murmur   . Morbid obesity ([facility])   . Neuromuscular disorder ([facility])    burning in both feet - pt. thinks its related to statin   . Sleep apnea    told that he was borderline, but he has lost 50 lbs  & is feeling much better     SURGICAL HISTORY: Past Surgical History:  Procedure Laterality Date  . AORTIC VALVE REPLACEMENT N/A 03/14/2017   Procedure: AORTIC VALVE REPLACEMENT (AVR);  Surgeon: Grace Isaac, MD;  Location: [facility];  Service: Open Heart Surgery;  Laterality: N/A;  Using 69mm Edwards Magna Ease Bioprosthetic Aortic Valve  . CARDIAC CATHETERIZATION N/A 03/10/2015   Procedure: Right/Left Heart Cath and Coronary Angiography;  Surgeon: Larey Dresser, MD;  Location: [facility];  Service: Cardiovascular;  Laterality: N/A;  . CARDIAC CATHETERIZATION N/A 03/10/2015   Procedure: Coronary Stent Intervention;  Surgeon: Leonie Man, MD;  Location: [facility];  Service: Cardiovascular;  Laterality: N/A;  . CORONARY ARTERY BYPASS GRAFT N/A 03/14/2017   Procedure: CORONARY ARTERY BYPASS GRAFTING (CABG), ON PUMP, TIMES TWO, USING LEFT INTERNAL MAMMARY ARTERY AND ENDOSCOPICALLY HARVESTED LEFT GREATER SAPHENOUS VEIN;  Surgeon: Grace Isaac, MD;  Location: [facility];  Service: Open Heart Surgery;  Laterality: N/A;  LIMA to LAD SVG to RAMUS INTERMEDIATE  . IR IMAGING GUIDED PORT INSERTION  01/08/2019  . REPLACEMENT ASCENDING AORTA N/A 03/14/2017   Procedure: REPLACEMENT ASCENDING AORTA WITH WHEAT PROCEDURE;  Surgeon: Grace Isaac, MD;  Location: [facility];  Service: Open Heart Surgery;  Laterality: N/A;  Using 69mm Hemashield Platinum Woven Double Velour Vascular Graft  . TEE WITHOUT CARDIOVERSION N/A 03/14/2017   Procedure: TRANSESOPHAGEAL ECHOCARDIOGRAM (TEE);  Surgeon: Grace Isaac, MD;  Location: [facility];  Service: Open Heart Surgery;  Laterality: N/A;  . TONSILLECTOMY      SOCIAL HISTORY: Social History   Socioeconomic History  . Marital status: Married    Spouse name: Not on file  . Number of children: Not on file  . Years of education: Not on file  . Highest education level: Not on file  Occupational History  . Not on file  Social Needs  . Financial  resource strain: Not on file  . Food insecurity    Worry: Not on file    Inability: Not on file  . Transportation needs    Medical: Not on file    Non-medical: Not on file  Tobacco Use  . Smoking status: Former Smoker    Packs/day: 1.00    Years: 15.00    Pack years: 15.00    Types: Cigarettes  . Smokeless tobacco: Never Used  . Tobacco comment: quit smoking ~ 20 yrs ago.  Substance and Sexual Activity  . Alcohol use: No    Alcohol/week: 0.0 standard drinks    Comment: prev drank - none in ~ 25 yrs.  . Drug use: No    Comment: prev smoked MJ, none in 25 yrs.  . Sexual activity: Yes  Lifestyle  . Physical activity    Days per week: Not on file    Minutes per session: Not on file  . Stress: Not on file  Relationships  . Social connections    Talks  on phone: Not on file    Gets together: Not on file    Attends religious service: Not on file    Active member of club or organization: Not on file    Attends meetings of clubs or organizations: Not on file    Relationship status: Not on file  . Intimate partner violence    Fear of current or ex partner: Not on file    Emotionally abused: Not on file    Physically abused: Not on file    Forced sexual activity: Not on file  Other Topics Concern  . Not on file  Social History Narrative   Pt lives in [place] with his wife and son.  He has 2 step children and another child - all grown and out of the house.    FAMILY HISTORY: Family History  Problem Relation Age of Onset  . Other Father        died young in [place].  Marland Kitchen Heart attack Mother        alive @ 13.  . Other Brother        A & W  . Other Sister        A & W    ALLERGIES:  is allergic to lipitor [atorvastatin] and penicillins.  MEDICATIONS:  Current Outpatient Medications  Medication Sig Dispense Refill  . acetaminophen (TYLENOL) 325 MG tablet Take 2 tablets (650 mg total) by mouth every 6 (six) hours as needed for mild pain.    Marland Kitchen allopurinol (ZYLOPRIM) 300 MG  tablet Take 1/2 (one-half) tablet by mouth once daily 30 tablet 1  . aspirin EC 81 MG tablet Take 1 tablet (81 mg total) by mouth daily.    . calcium citrate-vitamin D (CITRACAL+D) 315-200 MG-UNIT tablet Take 1 tablet by mouth daily.    . carvedilol (COREG) 6.25 MG tablet Take 1 tablet by mouth twice daily 60 tablet 2  . Coenzyme Q10 (COQ10) 400 MG CAPS Take 400 mg by mouth 2 (two) times daily.    . Flax OIL Take 1 capsule by mouth daily.    . Lactobacillus (ULTIMATE PROBIOTIC FORMULA) CAPS Take 1 capsule by mouth daily.     Marland Kitchen lidocaine-prilocaine (EMLA) cream Apply to affected area once 30 g 3  . lisinopril (ZESTRIL) 5 MG tablet Take 1 tablet by mouth twice daily 60 tablet 2  . LORazepam (ATIVAN) 0.5 MG tablet TAKE 1 TABLET BY MOUTH EVERY 6 HOURS AS NEEDED FOR NAUSEA FOR VOMITING 30 tablet 0  . Magnesium 400 MG TABS Take 400 mg by mouth 2 (two) times daily.     . Omega-3 Fatty Acids (FISH OIL CONCENTRATE PO) Take 1 capsule by mouth daily.    . ondansetron (ZOFRAN) 8 MG tablet Take 1 tablet (8 mg total) by mouth 2 (two) times daily as needed for refractory nausea / vomiting. Start on day 3 after cyclophosphamide. 30 tablet 1  . oxymetazoline (AFRIN) 0.05 % nasal spray Place 1 spray into both nostrils at bedtime as needed for congestion.    . pantoprazole (PROTONIX) 40 MG tablet Take 1 tablet (40 mg total) by mouth daily. 30 tablet 1  . predniSONE (DELTASONE) 20 MG tablet Take 3 tablets (60 mg total) by mouth daily. Take on days 1-5 of chemotherapy. 15 tablet 5  . prochlorperazine (COMPAZINE) 10 MG tablet Take 1 tablet (10 mg total) by mouth every 6 (six) hours as needed (Nausea or vomiting). 30 tablet 1  . spironolactone (ALDACTONE) 25 MG tablet  Take 1/2 (one-half) tablet by mouth once daily 45 tablet 1   No current facility-administered medications for this visit.    Facility-Administered Medications Ordered in Other Visits  Medication Dose Route Frequency Provider Last Rate Last Dose  .  cyclophosphamide (CYTOXAN) 1,660 mg in sodium chloride 0.9 % 250 mL chemo infusion  750 mg/m2 (Treatment Plan Recorded) Intravenous Once Brunetta Genera, MD      . etoposide (VEPESID) 220 mg in sodium chloride 0.9 % 1,000 mL chemo infusion  100 mg/m2 (Treatment Plan Recorded) Intravenous Once Brunetta Genera, MD      . heparin lock flush 100 unit/mL  500 Units Intracatheter Once PRN Brunetta Genera, MD      . riTUXimab (RITUXAN) 800 mg in sodium chloride 0.9 % 170 mL infusion  375 mg/m2 (Treatment Plan Recorded) Intravenous Once Brunetta Genera, MD      . sodium chloride flush (NS) 0.9 % injection 10 mL  10 mL Intracatheter PRN Brunetta Genera, MD      . vinCRIStine (ONCOVIN) 2 mg in sodium chloride 0.9 % 50 mL chemo infusion  2 mg Intravenous Once Brunetta Genera, MD        REVIEW OF SYSTEMS:   A 10+ POINT REVIEW OF SYSTEMS WAS OBTAINED including neurology, dermatology, psychiatry, cardiac, respiratory, lymph, extremities, GI, GU, Musculoskeletal, constitutional, breasts, reproductive, HEENT.  All pertinent positives are noted in the HPI.  All others are negative.   PHYSICAL EXAMINATION: ECOG PERFORMANCE STATUS: 1 - Symptomatic but completely ambulatory  Vitals:   05/07/19 0912  BP: 140/83  Pulse: 71  Resp: 17  Temp: 98.3 F (36.8 C)  SpO2: 99%   Filed Weights   05/07/19 0912  Weight: 231 lb 9.6 oz (105.1 kg)   .Body mass index is 33.23 kg/m.   GENERAL:alert, in no acute distress and comfortable SKIN: no acute rashes, no significant lesions EYES: conjunctiva are pink and non-injected, sclera anicteric OROPHARYNX: MMM, no exudates, no oropharyngeal erythema or ulceration NECK: supple, no JVD LYMPH:  no palpable lymphadenopathy in the cervical, axillary or inguinal regions LUNGS: clear to auscultation b/l with normal respiratory effort HEART: regular rate & rhythm ABDOMEN:  normoactive bowel sounds , non tender, not distended. No palpable  hepatosplenomegaly.  Extremity: no pedal edema PSYCH: alert & oriented x 3 with fluent speech NEURO: no focal motor/sensory deficits  LABORATORY DATA:  I have reviewed the data as listed  . CBC Latest Ref Rng & Units 05/07/2019 04/16/2019 03/26/2019  WBC 4.0 - 10.5 K/uL 7.7 7.2 8.1  Hemoglobin 13.0 - 17.0 g/dL 11.0(L) 11.1(L) 11.7(L)  Hematocrit 39.0 - 52.0 % 32.9(L) 33.3(L) 34.2(L)  Platelets 150 - 400 K/uL 276 303 282    . CMP Latest Ref Rng & Units 05/07/2019 04/16/2019 03/26/2019  Glucose 70 - 99 mg/dL 113(H) 145(H) 120(H)  BUN 8 - 23 mg/dL 12 13 16   Creatinine 0.61 - 1.24 mg/dL 0.90 0.95 0.92  Sodium 135 - 145 mmol/L 140 137 137  Potassium 3.5 - 5.1 mmol/L 4.2 4.1 4.2  Chloride 98 - 111 mmol/L 105 104 105  CO2 22 - 32 mmol/L 24 23 24   Calcium 8.9 - 10.3 mg/dL 9.2 9.4 9.3  Total Protein 6.5 - 8.1 g/dL 6.9 7.0 7.0  Total Bilirubin 0.3 - 1.2 mg/dL 0.3 0.5 0.4  Alkaline Phos 38 - 126 U/L 68 59 65  AST 15 - 41 U/L 15 17 14(L)  ALT 0 - 44 U/L 16 15 13  12/25/18 Left Retroperitoneal Biopsy:    RADIOGRAPHIC STUDIES: I have personally reviewed the radiological images as listed and agreed with the findings in the report. Nm Pet Image Restag (ps) Skull Base To Thigh  Result Date: 04/09/2019 CLINICAL DATA:  Subsequent treatment strategy for diffuse large B-cell lymphoma status post 3 cycles chemoimmunotherapy. EXAM: NUCLEAR MEDICINE PET SKULL BASE TO THIGH TECHNIQUE: 10.5 mCi F-18 FDG was injected intravenously. Full-ring PET imaging was performed from the skull base to thigh after the radiotracer. CT data was obtained and used for attenuation correction and anatomic localization. Fasting blood glucose: 110 mg/dl COMPARISON:  12/19/2018 PET-CT. FINDINGS: Mediastinal blood pool activity: SUV max 3.2 Liver activity: SUV max 4.8 NECK: No hypermetabolic lymph nodes in the neck. Incidental CT findings: none CHEST: No new or residual enlarged or hypermetabolic lymph nodes in the mediastinum.  Previously visualized bilateral paratracheal hypermetabolic lymphadenopathy has resolved. No hypermetabolic pulmonary findings. Incidental CT findings: Coronary atherosclerosis status post CABG. Aortic valve prosthesis in place. Right internal jugular Port-A-Cath terminates at the cavoatrial junction. Atherosclerotic nonaneurysmal thoracic aorta. No acute consolidative airspace disease, lung masses or significant pulmonary nodules. Intact appearing sternotomy wires. ABDOMEN/PELVIS: No new or residual hypermetabolic lymph nodes in the abdomen or pelvis. No residual hypermetabolism within the previously noted hypermetabolic gastrohepatic ligament, porta hepatis, aortocaval, left periaortic or common iliac lymph nodes, which have all decreased in size. Representative 0.9 cm aortocaval node with max SUV 2.6 (series 4/image 135), previously 2.5 cm with max SUV 27.5. No measurable uptake within the otherwise subcentimeter retroperitoneal lymph nodes. No abnormal hypermetabolic activity within the liver, pancreas, adrenal glands, or spleen. Incidental CT findings: Atherosclerotic nonaneurysmal abdominal aorta. Moderate left colonic diverticulosis. Mildly enlarged prostate. SKELETON: No focal hypermetabolic activity to suggest skeletal metastasis. New diffuse hypermetabolism throughout the marrow of axial and proximal appendicular skeleton. Incidental CT findings: none IMPRESSION: 1. Essentially complete metabolic response. No new or residual hypermetabolic lymphadenopathy in the chest, abdomen or pelvis. Deauville score 2. 2.  Aortic Atherosclerosis (ICD10-I70.0). Electronically Signed   By: Ilona Sorrel M.D.   On: 04/09/2019 14:55    ASSESSMENT & PLAN:  63 y.o. male with  1. Recenty diagnosed Diffuse Large B-Cell Lymphoma, Stage III-A Labs upon initial presentation from 12/25/18, blood counts normal including WBC at 5.3k, HGB at 14.9 and PLT at 220k 12/25/18 Left retroperitoneal biopsy revealed Diffuse Large B-Cell  Lymphoma 12/19/18 PET/CT revealed "Hypermetabolic adenopathy within the chest, abdomen, and upper pelvis, most consistent with a lymphoproliferative process. 2. Aortic Atherosclerosis." 01/01/19 Hep B and Hep C negative  01/04/19 ECHO revealed LV EF of 40-45%, moderately increased left ventricular wall thickness, impaired relaxation, and moderate hypokinesis of LV, entire inferior wall and apical segment. Discussed that the general treatment recommendation is up to 6 cycles of R-CHOP. However, given his cardiac considerations I would recommend substituting Etoposide for Doxorubicin for R-CEOP regimen to limit risk of worsening heart failure. Discussed that this choice can decrease treatment efficacy but would aim to preserve heart function. Pt strongly prefers outpatient treatment and to avoid anthracycline in any form, which I also recommend.  04/09/2019 PET/CT Whole Body Scan ([id number]) which revealed "1. Essentially complete metabolic response. No new or residual hypermetabolic lymphadenopathy in the chest, abdomen or pelvis. Deauville score 2.  Aortic Atherosclerosis (ICD10-I70.0)."  PLAN: -Discussed pt labwork today, 05/07/19; all values are WNL except for RBC at 3.44, Hgb at 11.0, HCT at 32.9, Glucose at 113. -Discussed 05/07/2019 LDH at 187 -Advised pt that Diffuse Large B-Cell Lymphoma has  a 20% chance of recurrence and 98% of recurrence occurs in the first two years -Recommended eating well, sleeping well, eating more fresh fruits & vegetables, and avoiding smoking and alcohol to help the immune system.   -Will consider giving pt pneumonia vaccines next visit   -Pt has no prohibitive toxicities from continuing C6 R-CEOP with G-CSF support at this time.  -Plan to treat for up to 6 cycles of R-CEOP, this is his last cycle -Rx plan reviewed and signed -Will get a repeat PET/CT in 5 weeks for post-treatment baseline -Will see back in 6 weeks with labs  Patient subsequently requested immediate  removal of his port a cath after completion of this cycle of chemo -order placed.  FOLLOW UP: PET/CT in 5 weeks RTC with Dr Irene Limbo in 6 weeks with labs.  The total time spent in the appt was 25 minutes and more than 50% was on counseling and direct patient cares.  All of the patient's questions were answered with apparent satisfaction. The patient knows to call the clinic with any problems, questions or concerns.    Sullivan Lone MD Ali Chuk AAHIVMS [facility] Hematology/Oncology Physician [facility]  (Office):       [phone] (Work cell):  [phone] (Fax):           [phone]  05/07/2019 10:54 AM  I, Yevette Edwards, am acting as a scribe for Dr. Sullivan Lone.   .I have reviewed the above documentation for accuracy and completeness, and I agree with the above. Brunetta Genera MD

## 2019-05-07 ENCOUNTER — Other Ambulatory Visit: Payer: Self-pay

## 2019-05-07 ENCOUNTER — Telehealth: Payer: Self-pay | Admitting: Hematology

## 2019-05-07 ENCOUNTER — Inpatient Hospital Stay: Payer: BC Managed Care – PPO

## 2019-05-07 ENCOUNTER — Inpatient Hospital Stay (HOSPITAL_BASED_OUTPATIENT_CLINIC_OR_DEPARTMENT_OTHER): Payer: BC Managed Care – PPO | Admitting: Hematology

## 2019-05-07 VITALS — BP 140/83 | HR 71 | Temp 98.3°F | Resp 17 | Ht 70.0 in | Wt 231.6 lb

## 2019-05-07 VITALS — BP 131/57 | HR 64 | Temp 97.7°F | Resp 17

## 2019-05-07 DIAGNOSIS — C8338 Diffuse large B-cell lymphoma, lymph nodes of multiple sites: Secondary | ICD-10-CM | POA: Diagnosis not present

## 2019-05-07 DIAGNOSIS — Z5111 Encounter for antineoplastic chemotherapy: Secondary | ICD-10-CM

## 2019-05-07 DIAGNOSIS — Z95828 Presence of other vascular implants and grafts: Secondary | ICD-10-CM

## 2019-05-07 DIAGNOSIS — Z7189 Other specified counseling: Secondary | ICD-10-CM

## 2019-05-07 LAB — CBC WITH DIFFERENTIAL/PLATELET
Abs Immature Granulocytes: 0.04 10*3/uL (ref 0.00–0.07)
Basophils Absolute: 0 10*3/uL (ref 0.0–0.1)
Basophils Relative: 0 %
Eosinophils Absolute: 0.1 10*3/uL (ref 0.0–0.5)
Eosinophils Relative: 1 %
HCT: 32.9 % — ABNORMAL LOW (ref 39.0–52.0)
Hemoglobin: 11 g/dL — ABNORMAL LOW (ref 13.0–17.0)
Immature Granulocytes: 1 %
Lymphocytes Relative: 13 %
Lymphs Abs: 1 10*3/uL (ref 0.7–4.0)
MCH: 32 pg (ref 26.0–34.0)
MCHC: 33.4 g/dL (ref 30.0–36.0)
MCV: 95.6 fL (ref 80.0–100.0)
Monocytes Absolute: 0.4 10*3/uL (ref 0.1–1.0)
Monocytes Relative: 5 %
Neutro Abs: 6.2 10*3/uL (ref 1.7–7.7)
Neutrophils Relative %: 80 %
Platelets: 276 10*3/uL (ref 150–400)
RBC: 3.44 MIL/uL — ABNORMAL LOW (ref 4.22–5.81)
RDW: 14.8 % (ref 11.5–15.5)
WBC: 7.7 10*3/uL (ref 4.0–10.5)
nRBC: 0 % (ref 0.0–0.2)

## 2019-05-07 LAB — CMP (CANCER CENTER ONLY)
ALT: 16 U/L (ref 0–44)
AST: 15 U/L (ref 15–41)
Albumin: 3.7 g/dL (ref 3.5–5.0)
Alkaline Phosphatase: 68 U/L (ref 38–126)
Anion gap: 11 (ref 5–15)
BUN: 12 mg/dL (ref 8–23)
CO2: 24 mmol/L (ref 22–32)
Calcium: 9.2 mg/dL (ref 8.9–10.3)
Chloride: 105 mmol/L (ref 98–111)
Creatinine: 0.9 mg/dL (ref 0.61–1.24)
GFR, Est AFR Am: >60 mL/min (ref >60)
GFR, Estimated: >60 mL/min (ref >60)
Glucose, Bld: 113 mg/dL — ABNORMAL HIGH (ref 70–99)
Potassium: 4.2 mmol/L (ref 3.5–5.1)
Sodium: 140 mmol/L (ref 135–145)
Total Bilirubin: 0.3 mg/dL (ref 0.3–1.2)
Total Protein: 6.9 g/dL (ref 6.5–8.1)

## 2019-05-07 LAB — LACTATE DEHYDROGENASE: LDH: 187 U/L (ref 98–192)

## 2019-05-07 MED ORDER — VINCRISTINE SULFATE CHEMO INJECTION 1 MG/ML
2.0000 mg | Freq: Once | INTRAVENOUS | Status: AC
  Administered 2019-05-07: 12:00:00 2 mg via INTRAVENOUS
  Filled 2019-05-07: qty 2

## 2019-05-07 MED ORDER — SODIUM CHLORIDE 0.9% FLUSH
10.0000 mL | Freq: Once | INTRAVENOUS | Status: AC
  Administered 2019-05-07: 10 mL
  Filled 2019-05-07: qty 10

## 2019-05-07 MED ORDER — ACETAMINOPHEN 325 MG PO TABS
650.0000 mg | ORAL_TABLET | Freq: Once | ORAL | Status: AC
  Administered 2019-05-07: 11:00:00 650 mg via ORAL

## 2019-05-07 MED ORDER — ACETAMINOPHEN 325 MG PO TABS
ORAL_TABLET | ORAL | Status: AC
  Filled 2019-05-07: qty 2

## 2019-05-07 MED ORDER — SODIUM CHLORIDE 0.9 % IV SOLN
100.0000 mg/m2 | Freq: Once | INTRAVENOUS | Status: AC
  Administered 2019-05-07: 220 mg via INTRAVENOUS
  Filled 2019-05-07: qty 11

## 2019-05-07 MED ORDER — SODIUM CHLORIDE 0.9% FLUSH
10.0000 mL | INTRAVENOUS | Status: DC | PRN
  Administered 2019-05-07: 10 mL
  Filled 2019-05-07: qty 10

## 2019-05-07 MED ORDER — PALONOSETRON HCL INJECTION 0.25 MG/5ML
INTRAVENOUS | Status: AC
  Filled 2019-05-07: qty 5

## 2019-05-07 MED ORDER — DIPHENHYDRAMINE HCL 25 MG PO CAPS
ORAL_CAPSULE | ORAL | Status: AC
  Filled 2019-05-07: qty 2

## 2019-05-07 MED ORDER — SODIUM CHLORIDE 0.9 % IV SOLN
375.0000 mg/m2 | Freq: Once | INTRAVENOUS | Status: AC
  Administered 2019-05-07: 800 mg via INTRAVENOUS
  Filled 2019-05-07: qty 30

## 2019-05-07 MED ORDER — DEXAMETHASONE SODIUM PHOSPHATE 10 MG/ML IJ SOLN
INTRAMUSCULAR | Status: AC
  Filled 2019-05-07: qty 1

## 2019-05-07 MED ORDER — SODIUM CHLORIDE 0.9 % IV SOLN
Freq: Once | INTRAVENOUS | Status: AC
  Administered 2019-05-07: 10:00:00 via INTRAVENOUS
  Filled 2019-05-07: qty 250

## 2019-05-07 MED ORDER — SODIUM CHLORIDE 0.9 % IV SOLN
750.0000 mg/m2 | Freq: Once | INTRAVENOUS | Status: AC
  Administered 2019-05-07: 1660 mg via INTRAVENOUS
  Filled 2019-05-07: qty 83

## 2019-05-07 MED ORDER — DEXAMETHASONE SODIUM PHOSPHATE 10 MG/ML IJ SOLN
10.0000 mg | Freq: Once | INTRAMUSCULAR | Status: AC
  Administered 2019-05-07: 10 mg via INTRAVENOUS

## 2019-05-07 MED ORDER — PALONOSETRON HCL INJECTION 0.25 MG/5ML
0.2500 mg | Freq: Once | INTRAVENOUS | Status: AC
  Administered 2019-05-07: 0.25 mg via INTRAVENOUS

## 2019-05-07 MED ORDER — HEPARIN SOD (PORK) LOCK FLUSH 100 UNIT/ML IV SOLN
500.0000 [IU] | Freq: Once | INTRAVENOUS | Status: AC | PRN
  Administered 2019-05-07: 16:00:00 500 [IU]
  Filled 2019-05-07: qty 5

## 2019-05-07 MED ORDER — DIPHENHYDRAMINE HCL 25 MG PO CAPS
50.0000 mg | ORAL_CAPSULE | Freq: Once | ORAL | Status: AC
  Administered 2019-05-07: 50 mg via ORAL

## 2019-05-07 NOTE — Patient Instructions (Signed)

## 2019-05-07 NOTE — Progress Notes (Signed)
Patient wanted to know when he could stop taking his allopurinol prescription. Per Dr. Irene Limbo: patient could stop taking it today. Instructions communicated to patient who verbalized understanding and agreement.

## 2019-05-07 NOTE — Telephone Encounter (Signed)
Scheduled appt 10/27 los.  Left a VM of the appt date and time.

## 2019-05-08 ENCOUNTER — Inpatient Hospital Stay: Payer: BC Managed Care – PPO

## 2019-05-08 ENCOUNTER — Other Ambulatory Visit: Payer: Self-pay

## 2019-05-08 VITALS — BP 126/66 | HR 71 | Temp 98.3°F | Resp 19

## 2019-05-08 DIAGNOSIS — Z7189 Other specified counseling: Secondary | ICD-10-CM

## 2019-05-08 DIAGNOSIS — Z5111 Encounter for antineoplastic chemotherapy: Secondary | ICD-10-CM | POA: Diagnosis not present

## 2019-05-08 DIAGNOSIS — C8338 Diffuse large B-cell lymphoma, lymph nodes of multiple sites: Secondary | ICD-10-CM

## 2019-05-08 MED ORDER — SODIUM CHLORIDE 0.9 % IV SOLN
100.0000 mg/m2 | Freq: Once | INTRAVENOUS | Status: AC
  Administered 2019-05-08: 220 mg via INTRAVENOUS
  Filled 2019-05-08: qty 11

## 2019-05-08 MED ORDER — PROCHLORPERAZINE MALEATE 10 MG PO TABS
ORAL_TABLET | ORAL | Status: AC
  Filled 2019-05-08: qty 1

## 2019-05-08 MED ORDER — SODIUM CHLORIDE 0.9 % IV SOLN
Freq: Once | INTRAVENOUS | Status: AC
  Administered 2019-05-08: 15:00:00 via INTRAVENOUS
  Filled 2019-05-08: qty 250

## 2019-05-08 MED ORDER — HEPARIN SOD (PORK) LOCK FLUSH 100 UNIT/ML IV SOLN
500.0000 [IU] | Freq: Once | INTRAVENOUS | Status: AC | PRN
  Administered 2019-05-08: 500 [IU]
  Filled 2019-05-08: qty 5

## 2019-05-08 MED ORDER — PROCHLORPERAZINE MALEATE 10 MG PO TABS
10.0000 mg | ORAL_TABLET | Freq: Once | ORAL | Status: AC
  Administered 2019-05-08: 15:00:00 10 mg via ORAL

## 2019-05-08 MED ORDER — SODIUM CHLORIDE 0.9% FLUSH
10.0000 mL | INTRAVENOUS | Status: DC | PRN
  Administered 2019-05-08: 10 mL
  Filled 2019-05-08: qty 10

## 2019-05-08 NOTE — Patient Instructions (Signed)
[facility] Discharge Instructions for Patients Receiving Chemotherapy  Today you received the following chemotherapy agents: Etoposide (Vepeside, VP-16)  To help prevent nausea and vomiting after your treatment, we encourage you to take your nausea medication as directed.   If you develop nausea and vomiting that is not controlled by your nausea medication, call the clinic.   BELOW ARE SYMPTOMS THAT SHOULD BE REPORTED IMMEDIATELY:  *FEVER GREATER THAN 100.5 F  *CHILLS WITH OR WITHOUT FEVER  NAUSEA AND VOMITING THAT IS NOT CONTROLLED WITH YOUR NAUSEA MEDICATION  *UNUSUAL SHORTNESS OF BREATH  *UNUSUAL BRUISING OR BLEEDING  TENDERNESS IN MOUTH AND THROAT WITH OR WITHOUT PRESENCE OF ULCERS  *URINARY PROBLEMS  *BOWEL PROBLEMS  UNUSUAL RASH Items with * indicate a potential emergency and should be followed up as soon as possible.  Feel free to call the clinic should you have any questions or concerns. The clinic phone number is [phone].  Please show the CHEMO ALERT CARD at check-in to the Emergency Department and triage nurse.  Coronavirus (COVID-19) Are you at risk?  Are you at risk for the Coronavirus (COVID-19)?  To be considered HIGH RISK for Coronavirus (COVID-19), you have to meet the following criteria:  . Traveled to [place], [place], [place], [place] or [place]; or in the [place] to [place], [place], [place], or [place]; and have fever, cough, and shortness of breath within the last 2 weeks of travel OR . Been in close contact with a person diagnosed with COVID-19 within the last 2 weeks and have fever, cough, and shortness of breath . IF YOU DO NOT MEET THESE CRITERIA, YOU ARE CONSIDERED LOW RISK FOR COVID-19.  What to do if you are HIGH RISK for COVID-19?  . If you are having a medical emergency, call 911. . Seek medical care right away. Before you go to a doctor's office, urgent care or emergency department, call ahead and tell  them about your recent travel, contact with someone diagnosed with COVID-19, and your symptoms. You should receive instructions from your physician's office regarding next steps of care.  . When you arrive at healthcare provider, tell the healthcare staff immediately you have returned from visiting [place], [place], [place], [place] or [place]; or traveled in the [place] to [place], [place], [place], or [place]; in the last two weeks or you have been in close contact with a person diagnosed with COVID-19 in the last 2 weeks.   . Tell the health care staff about your symptoms: fever, cough and shortness of breath. . After you have been seen by a medical provider, you will be either: o Tested for (COVID-19) and discharged home on quarantine except to seek medical care if symptoms worsen, and asked to  - Stay home and avoid contact with others until you get your results (4-5 days)  - Avoid travel on public transportation if possible (such as bus, train, or airplane) or o Sent to the Emergency Department by EMS for evaluation, COVID-19 testing, and possible admission depending on your condition and test results.  What to do if you are LOW RISK for COVID-19?  Reduce your risk of any infection by using the same precautions used for avoiding the common cold or flu:  . Wash your hands often with soap and warm water for at least 20 seconds.  If soap and water are not readily available, use an alcohol-based hand sanitizer with at least 60% alcohol.  . If coughing   or sneezing, cover your mouth and nose by coughing or sneezing into the elbow areas of your shirt or coat, into a tissue or into your sleeve (not your hands). . Avoid shaking hands with others and consider head nods or verbal greetings only. . Avoid touching your eyes, nose, or mouth with unwashed hands.  . Avoid close contact with people who are sick. . Avoid places or events with large numbers of people in one location, like concerts or  sporting events. . Carefully consider travel plans you have or are making. . If you are planning any travel outside or inside the [place], visit the CDC's Travelers' Health webpage for the latest health notices. . If you have some symptoms but not all symptoms, continue to monitor at home and seek medical attention if your symptoms worsen. . If you are having a medical emergency, call 911.   ADDITIONAL HEALTHCARE OPTIONS FOR PATIENTS  [facility] / e-Visit: [email or web address]         [facility]: [phone]  [facility]: [phone]                   [facility]: [phone]   

## 2019-05-09 ENCOUNTER — Other Ambulatory Visit: Payer: Self-pay

## 2019-05-09 ENCOUNTER — Inpatient Hospital Stay: Payer: BC Managed Care – PPO

## 2019-05-09 VITALS — BP 142/89 | HR 66 | Temp 98.9°F | Resp 18

## 2019-05-09 DIAGNOSIS — Z7189 Other specified counseling: Secondary | ICD-10-CM

## 2019-05-09 DIAGNOSIS — Z5111 Encounter for antineoplastic chemotherapy: Secondary | ICD-10-CM | POA: Diagnosis not present

## 2019-05-09 DIAGNOSIS — C8338 Diffuse large B-cell lymphoma, lymph nodes of multiple sites: Secondary | ICD-10-CM

## 2019-05-09 MED ORDER — SODIUM CHLORIDE 0.9 % IV SOLN
Freq: Once | INTRAVENOUS | Status: AC
  Administered 2019-05-09: 15:00:00 via INTRAVENOUS
  Filled 2019-05-09: qty 250

## 2019-05-09 MED ORDER — SODIUM CHLORIDE 0.9% FLUSH
10.0000 mL | INTRAVENOUS | Status: DC | PRN
  Administered 2019-05-09: 10 mL
  Filled 2019-05-09: qty 10

## 2019-05-09 MED ORDER — PROCHLORPERAZINE MALEATE 10 MG PO TABS
10.0000 mg | ORAL_TABLET | Freq: Once | ORAL | Status: AC
  Administered 2019-05-09: 10 mg via ORAL

## 2019-05-09 MED ORDER — HEPARIN SOD (PORK) LOCK FLUSH 100 UNIT/ML IV SOLN
500.0000 [IU] | Freq: Once | INTRAVENOUS | Status: AC | PRN
  Administered 2019-05-09: 500 [IU]
  Filled 2019-05-09: qty 5

## 2019-05-09 MED ORDER — SODIUM CHLORIDE 0.9 % IV SOLN
100.0000 mg/m2 | Freq: Once | INTRAVENOUS | Status: AC
  Administered 2019-05-09: 220 mg via INTRAVENOUS
  Filled 2019-05-09: qty 11

## 2019-05-09 MED ORDER — PROCHLORPERAZINE MALEATE 10 MG PO TABS
ORAL_TABLET | ORAL | Status: AC
  Filled 2019-05-09: qty 1

## 2019-05-09 NOTE — Patient Instructions (Signed)
[facility] Discharge Instructions for Patients Receiving Chemotherapy  Today you received the following chemotherapy agents: Etoposide (Vepeside, VP-16)  To help prevent nausea and vomiting after your treatment, we encourage you to take your nausea medication as directed.   If you develop nausea and vomiting that is not controlled by your nausea medication, call the clinic.   BELOW ARE SYMPTOMS THAT SHOULD BE REPORTED IMMEDIATELY:  *FEVER GREATER THAN 100.5 F  *CHILLS WITH OR WITHOUT FEVER  NAUSEA AND VOMITING THAT IS NOT CONTROLLED WITH YOUR NAUSEA MEDICATION  *UNUSUAL SHORTNESS OF BREATH  *UNUSUAL BRUISING OR BLEEDING  TENDERNESS IN MOUTH AND THROAT WITH OR WITHOUT PRESENCE OF ULCERS  *URINARY PROBLEMS  *BOWEL PROBLEMS  UNUSUAL RASH Items with * indicate a potential emergency and should be followed up as soon as possible.  Feel free to call the clinic should you have any questions or concerns. The clinic phone number is [phone].  Please show the CHEMO ALERT CARD at check-in to the Emergency Department and triage nurse.  Coronavirus (COVID-19) Are you at risk?  Are you at risk for the Coronavirus (COVID-19)?  To be considered HIGH RISK for Coronavirus (COVID-19), you have to meet the following criteria:  . Traveled to [place], [place], [place], [place] or [place]; or in the [place] to [place], [place], [place], or [place]; and have fever, cough, and shortness of breath within the last 2 weeks of travel OR . Been in close contact with a person diagnosed with COVID-19 within the last 2 weeks and have fever, cough, and shortness of breath . IF YOU DO NOT MEET THESE CRITERIA, YOU ARE CONSIDERED LOW RISK FOR COVID-19.  What to do if you are HIGH RISK for COVID-19?  . If you are having a medical emergency, call 911. . Seek medical care right away. Before you go to a doctor's office, urgent care or emergency department, call ahead and tell  them about your recent travel, contact with someone diagnosed with COVID-19, and your symptoms. You should receive instructions from your physician's office regarding next steps of care.  . When you arrive at healthcare provider, tell the healthcare staff immediately you have returned from visiting [place], [place], [place], [place] or [place]; or traveled in the [place] to [place], [place], [place], or [place]; in the last two weeks or you have been in close contact with a person diagnosed with COVID-19 in the last 2 weeks.   . Tell the health care staff about your symptoms: fever, cough and shortness of breath. . After you have been seen by a medical provider, you will be either: o Tested for (COVID-19) and discharged home on quarantine except to seek medical care if symptoms worsen, and asked to  - Stay home and avoid contact with others until you get your results (4-5 days)  - Avoid travel on public transportation if possible (such as bus, train, or airplane) or o Sent to the Emergency Department by EMS for evaluation, COVID-19 testing, and possible admission depending on your condition and test results.  What to do if you are LOW RISK for COVID-19?  Reduce your risk of any infection by using the same precautions used for avoiding the common cold or flu:  . Wash your hands often with soap and warm water for at least 20 seconds.  If soap and water are not readily available, use an alcohol-based hand sanitizer with at least 60% alcohol.  . If coughing   or sneezing, cover your mouth and nose by coughing or sneezing into the elbow areas of your shirt or coat, into a tissue or into your sleeve (not your hands). . Avoid shaking hands with others and consider head nods or verbal greetings only. . Avoid touching your eyes, nose, or mouth with unwashed hands.  . Avoid close contact with people who are sick. . Avoid places or events with large numbers of people in one location, like concerts or  sporting events. . Carefully consider travel plans you have or are making. . If you are planning any travel outside or inside the [place], visit the CDC's Travelers' Health webpage for the latest health notices. . If you have some symptoms but not all symptoms, continue to monitor at home and seek medical attention if your symptoms worsen. . If you are having a medical emergency, call 911.   ADDITIONAL HEALTHCARE OPTIONS FOR PATIENTS  [facility] / e-Visit: [email or web address]         [facility]: [phone]  [facility]: [phone]                   [facility]: [phone]   

## 2019-05-10 ENCOUNTER — Other Ambulatory Visit: Payer: Self-pay

## 2019-05-10 ENCOUNTER — Inpatient Hospital Stay: Payer: BC Managed Care – PPO

## 2019-05-10 ENCOUNTER — Other Ambulatory Visit: Payer: Self-pay | Admitting: Hematology

## 2019-05-10 VITALS — BP 145/93 | HR 68 | Temp 99.1°F | Resp 18

## 2019-05-10 DIAGNOSIS — C8338 Diffuse large B-cell lymphoma, lymph nodes of multiple sites: Secondary | ICD-10-CM

## 2019-05-10 DIAGNOSIS — Z5111 Encounter for antineoplastic chemotherapy: Secondary | ICD-10-CM | POA: Diagnosis not present

## 2019-05-10 DIAGNOSIS — Z7189 Other specified counseling: Secondary | ICD-10-CM

## 2019-05-10 DIAGNOSIS — Z95828 Presence of other vascular implants and grafts: Secondary | ICD-10-CM

## 2019-05-10 MED ORDER — PEGFILGRASTIM-CBQV 6 MG/0.6ML ~~LOC~~ SOSY
PREFILLED_SYRINGE | SUBCUTANEOUS | Status: AC
  Filled 2019-05-10: qty 0.6

## 2019-05-10 MED ORDER — PEGFILGRASTIM-CBQV 6 MG/0.6ML ~~LOC~~ SOSY
6.0000 mg | PREFILLED_SYRINGE | Freq: Once | SUBCUTANEOUS | Status: AC
  Administered 2019-05-10: 17:00:00 6 mg via SUBCUTANEOUS

## 2019-05-10 NOTE — Patient Instructions (Signed)

## 2019-05-23 ENCOUNTER — Other Ambulatory Visit (HOSPITAL_COMMUNITY): Payer: Self-pay | Admitting: Internal Medicine

## 2019-05-28 ENCOUNTER — Telehealth (HOSPITAL_COMMUNITY): Payer: Self-pay | Admitting: Vascular Surgery

## 2019-05-28 NOTE — Telephone Encounter (Signed)
Returned pt call,pt left VM on scheduling line wanting to make f/u appt w/ [facility], left pt message to call back to schedule appt

## 2019-05-30 ENCOUNTER — Telehealth (HOSPITAL_COMMUNITY): Payer: Self-pay | Admitting: Vascular Surgery

## 2019-05-30 NOTE — Telephone Encounter (Signed)
Left pt message to move up 12/29 appt w/ [facility] to 12/2 @ 10am asked pt to call back to confirm  appt change

## 2019-06-04 ENCOUNTER — Other Ambulatory Visit (HOSPITAL_COMMUNITY): Payer: Self-pay | Admitting: Internal Medicine

## 2019-06-05 ENCOUNTER — Other Ambulatory Visit: Payer: Self-pay | Admitting: Radiology

## 2019-06-07 ENCOUNTER — Ambulatory Visit (HOSPITAL_COMMUNITY)
Admission: RE | Admit: 2019-06-07 | Discharge: 2019-06-07 | Disposition: A | Discharge status: Home / Self Care | Payer: BC Managed Care – PPO | Source: Ambulatory Visit | Attending: Hematology | Admitting: Hematology

## 2019-06-07 ENCOUNTER — Encounter (HOSPITAL_COMMUNITY): Payer: Self-pay

## 2019-06-07 ENCOUNTER — Other Ambulatory Visit: Payer: Self-pay

## 2019-06-07 DIAGNOSIS — C8338 Diffuse large B-cell lymphoma, lymph nodes of multiple sites: Secondary | ICD-10-CM | POA: Diagnosis not present

## 2019-06-07 DIAGNOSIS — Z9221 Personal history of antineoplastic chemotherapy: Secondary | ICD-10-CM | POA: Insufficient documentation

## 2019-06-07 DIAGNOSIS — Z452 Encounter for adjustment and management of vascular access device: Secondary | ICD-10-CM | POA: Diagnosis not present

## 2019-06-07 DIAGNOSIS — Z95828 Presence of other vascular implants and grafts: Secondary | ICD-10-CM

## 2019-06-07 HISTORY — PX: IR REMOVAL TUN ACCESS W/ PORT W/O FL MOD SED: IMG2290

## 2019-06-07 LAB — CBC WITH DIFFERENTIAL/PLATELET
Abs Immature Granulocytes: 0.01 10*3/uL (ref 0.00–0.07)
Basophils Absolute: 0 10*3/uL (ref 0.0–0.1)
Basophils Relative: 0 %
Eosinophils Absolute: 0.1 10*3/uL (ref 0.0–0.5)
Eosinophils Relative: 1 %
HCT: 39.4 % (ref 39.0–52.0)
Hemoglobin: 13.1 g/dL (ref 13.0–17.0)
Immature Granulocytes: 0 %
Lymphocytes Relative: 25 %
Lymphs Abs: 1.1 10*3/uL (ref 0.7–4.0)
MCH: 32.1 pg (ref 26.0–34.0)
MCHC: 33.2 g/dL (ref 30.0–36.0)
MCV: 96.6 fL (ref 80.0–100.0)
Monocytes Absolute: 0.5 10*3/uL (ref 0.1–1.0)
Monocytes Relative: 11 %
Neutro Abs: 2.6 10*3/uL (ref 1.7–7.7)
Neutrophils Relative %: 63 %
Platelets: 281 10*3/uL (ref 150–400)
RBC: 4.08 MIL/uL — ABNORMAL LOW (ref 4.22–5.81)
RDW: 14.5 % (ref 11.5–15.5)
WBC: 4.3 10*3/uL (ref 4.0–10.5)
nRBC: 0 % (ref 0.0–0.2)

## 2019-06-07 LAB — PROTIME-INR
INR: 1 (ref 0.8–1.2)
Prothrombin Time: 12.9 seconds (ref 11.4–15.2)

## 2019-06-07 MED ORDER — FENTANYL CITRATE (PF) 100 MCG/2ML IJ SOLN
INTRAMUSCULAR | Status: AC | PRN
  Administered 2019-06-07 (×2): 50 ug via INTRAVENOUS

## 2019-06-07 MED ORDER — CLINDAMYCIN PHOSPHATE 900 MG/50ML IV SOLN
900.0000 mg | Freq: Once | INTRAVENOUS | Status: AC
  Administered 2019-06-07: 12:00:00 900 mg via INTRAVENOUS

## 2019-06-07 MED ORDER — SODIUM CHLORIDE 0.9 % IV SOLN
INTRAVENOUS | Status: DC
  Administered 2019-06-07: 11:00:00 via INTRAVENOUS

## 2019-06-07 MED ORDER — MIDAZOLAM HCL 2 MG/2ML IJ SOLN
INTRAMUSCULAR | Status: AC | PRN
  Administered 2019-06-07 (×2): 1 mg via INTRAVENOUS

## 2019-06-07 MED ORDER — CLINDAMYCIN PHOSPHATE 900 MG/50ML IV SOLN
INTRAVENOUS | Status: AC
  Administered 2019-06-07: 900 mg via INTRAVENOUS
  Filled 2019-06-07: qty 50

## 2019-06-07 MED ORDER — FENTANYL CITRATE (PF) 100 MCG/2ML IJ SOLN
INTRAMUSCULAR | Status: AC
  Filled 2019-06-07: qty 2

## 2019-06-07 MED ORDER — LIDOCAINE-EPINEPHRINE (PF) 2 %-1:200000 IJ SOLN
INTRAMUSCULAR | Status: AC | PRN
  Administered 2019-06-07: 10 mL

## 2019-06-07 MED ORDER — MIDAZOLAM HCL 2 MG/2ML IJ SOLN
INTRAMUSCULAR | Status: AC
  Filled 2019-06-07: qty 2

## 2019-06-07 MED ORDER — LIDOCAINE-EPINEPHRINE (PF) 2 %-1:200000 IJ SOLN
INTRAMUSCULAR | Status: AC
  Filled 2019-06-07: qty 20

## 2019-06-07 NOTE — Discharge Instructions (Addendum)
Implanted Port Removal, Care After °This sheet gives you information about how to care for yourself after your procedure. Your health care provider may also give you more specific instructions. If you have problems or questions, contact your health care provider. °What can I expect after the procedure? °After the procedure, it is common to have: °· Soreness or pain near your incision. °· Some swelling or bruising near your incision. °Follow these instructions at home: °Medicines °· Take over-the-counter and prescription medicines only as told by your health care provider. °· If you were prescribed an antibiotic medicine, take it as told by your health care provider. Do not stop taking the antibiotic even if you start to feel better. °Bathing °· Do not take baths, swim, or use a hot tub until your health care provider approves. Ask your health care provider if you can take showers. You may only be allowed to take sponge baths. °Incision care ° °· Follow instructions from your health care provider about how to take care of your incision. Make sure you: °? Wash your hands with soap and water before you change your bandage (dressing). If soap and water are not available, use hand sanitizer. °? Change your dressing as told by your health care provider. °? Keep your dressing dry. °? Leave stitches (sutures), skin glue, or adhesive strips in place. These skin closures may need to stay in place for 2 weeks or longer. If adhesive strip edges start to loosen and curl up, you may trim the loose edges. Do not remove adhesive strips completely unless your health care provider tells you to do that. °· Check your incision area every day for signs of infection. Check for: °? More redness, swelling, or pain. °? More fluid or blood. °? Warmth. °? Pus or a bad smell. °Driving ° °· Do not drive for 24 hours if you were given a medicine to help you relax (sedative) during your procedure. °· If you did not receive a sedative, ask your  health care provider when it is safe to drive. °Activity °· Return to your normal activities as told by your health care provider. Ask your health care provider what activities are safe for you. °· Do not lift anything that is heavier than 10 lb (4.5 kg), or the limit that you are told, until your health care provider says that it is safe. °· Do not do activities that involve lifting your arms over your head. °General instructions °· Do not use any products that contain nicotine or tobacco, such as cigarettes and e-cigarettes. These can delay healing. If you need help quitting, ask your health care provider. °· Keep all follow-up visits as told by your health care provider. This is important. °Contact a health care provider if: °· You have more redness, swelling, or pain around your incision. °· You have more fluid or blood coming from your incision. °· Your incision feels warm to the touch. °· You have pus or a bad smell coming from your incision. °· You have pain that is not relieved by your pain medicine. °Get help right away if you have: °· A fever or chills. °· Chest pain. °· Difficulty breathing. °Summary °· After the procedure, it is common to have pain, soreness, swelling, or bruising near your incision. °· If you were prescribed an antibiotic medicine, take it as told by your health care provider. Do not stop taking the antibiotic even if you start to feel better. °· Do not drive for 24 hours   if you were given a sedative during your procedure. °· Return to your normal activities as told by your health care provider. Ask your health care provider what activities are safe for you. °This information is not intended to replace advice given to you by your health care provider. Make sure you discuss any questions you have with your health care provider. °Document Released: 06/08/2015 Document Revised: 08/10/2017 Document Reviewed: 08/10/2017 °Elsevier Patient Education © 2020 Else[employer] Inc[employer] ° ° ° °Moderate  Conscious Sedation, Adult, Care After °These instructions provide you with information about caring for yourself after your procedure. Your health care provider may also give you more specific instructions. Your treatment has been planned according to current medical practices, but problems sometimes occur. Call your health care provider if you have any problems or questions after your procedure. °What can I expect after the procedure? °After your procedure, it is common: °· To feel sleepy for several hours. °· To feel clumsy and have poor balance for several hours. °· To have poor judgment for several hours. °· To vomit if you eat too soon. °Follow these instructions at home: °For at least 24 hours after the procedure: ° °· Do not: °? Participate in activities where you could fall or become injured. °? Drive. °? Use heavy machinery. °? Drink alcohol. °? Take sleeping pills or medicines that cause drowsiness. °? Make important decisions or sign legal documents. °? Take care of children on your own. °· Rest. °Eating and drinking °· Follow the diet recommended by your health care provider. °· If you vomit: °? Drink water, juice, or soup when you can drink without vomiting. °? Make sure you have little or no nausea before eating solid foods. °General instructions °· Have a responsible adult stay with you until you are awake and alert. °· Take over-the-counter and prescription medicines only as told by your health care provider. °· If you smoke, do not smoke without supervision. °· Keep all follow-up visits as told by your health care provider. This is important. °Contact a health care provider if: °· You keep feeling nauseous or you keep vomiting. °· You feel light-headed. °· You develop a rash. °· You have a fever. °Get help right away if: °· You have trouble breathing. °This information is not intended to replace advice given to you by your health care provider. Make sure you discuss any questions you have with your  health care provider. °Document Released: 04/17/2013 Document Revised: 06/09/2017 Document Reviewed: 10/17/2015 °Elsevier Patient Education © 2020 Elsevier Inc. ° °

## 2019-06-07 NOTE — Procedures (Signed)
Pre Procedural Dx: Poor venous access Post Procedural Dx: Same  Successful removal of anterior chest wall port-a-cath.  EBL: Minimal  No immediate post procedural complications.   Jay Ahnesti Townsend, MD Pager #: [phone]   

## 2019-06-07 NOTE — H&P (Signed)
Referring Physician(s): Brunetta Genera  Supervising Physician: Sandi Mariscal  Patient Status:  [facility] OP  Chief Complaint:  "I'm getting my port out"  Subjective: Patient familiar to IR service from left retroperitoneal lymph node biopsy on 12/25/2018 and Port-A-Cath placement on 01/08/2019.  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  He presents again today for Port-A-Cath removal.  He currently denies fever, headache, chest pain, dyspnea, cough, abdominal/back pain, nausea, vomiting or bleeding.  Past Medical History:  Diagnosis Date  . CHF (congestive heart failure) ([place])   . Coronary artery disease   . Essential hypertension   . GERD (gastroesophageal reflux disease)   . Heart murmur   . Morbid obesity ([place])   . Neuromuscular disorder ([place])    burning in both feet - pt. thinks its related to statin   . Sleep apnea    told that he was borderline, but he has lost 50 lbs & is feeling much better    Past Surgical History:  Procedure Laterality Date  . AORTIC VALVE REPLACEMENT N/A 03/14/2017   Procedure: AORTIC VALVE REPLACEMENT (AVR);  Surgeon: Grace Isaac, MD;  Location: [facility];  Service: Open Heart Surgery;  Laterality: N/A;  Using 63mm Edwards Magna Ease Bioprosthetic Aortic Valve  . CARDIAC CATHETERIZATION N/A 03/10/2015   Procedure: Right/Left Heart Cath and Coronary Angiography;  Surgeon: Larey Dresser, MD;  Location: [facility];  Service: Cardiovascular;  Laterality: N/A;  . CARDIAC CATHETERIZATION N/A 03/10/2015   Procedure: Coronary Stent Intervention;  Surgeon: Leonie Man, MD;  Location: [facility];  Service: Cardiovascular;  Laterality: N/A;  . CORONARY ARTERY BYPASS GRAFT N/A 03/14/2017   Procedure: CORONARY ARTERY BYPASS GRAFTING (CABG), ON PUMP, TIMES TWO, USING LEFT INTERNAL MAMMARY ARTERY AND ENDOSCOPICALLY HARVESTED LEFT GREATER SAPHENOUS VEIN;  Surgeon: Grace Isaac, MD;  Location: [facility];   Service: Open Heart Surgery;  Laterality: N/A;  LIMA to LAD SVG to RAMUS INTERMEDIATE  . IR IMAGING GUIDED PORT INSERTION  01/08/2019  . REPLACEMENT ASCENDING AORTA N/A 03/14/2017   Procedure: REPLACEMENT ASCENDING AORTA WITH WHEAT PROCEDURE;  Surgeon: Grace Isaac, MD;  Location: [facility];  Service: Open Heart Surgery;  Laterality: N/A;  Using 39mm Hemashield Platinum Woven Double Velour Vascular Graft  . TEE WITHOUT CARDIOVERSION N/A 03/14/2017   Procedure: TRANSESOPHAGEAL ECHOCARDIOGRAM (TEE);  Surgeon: Grace Isaac, MD;  Location: [facility];  Service: Open Heart Surgery;  Laterality: N/A;  . TONSILLECTOMY        Allergies: Lipitor [atorvastatin] and Penicillins  Medications: Prior to Admission medications   Medication Sig Start Date End Date Taking? Authorizing Provider  aspirin EC 81 MG tablet Take 1 tablet (81 mg total) by mouth daily. 04/20/17  Yes Grace Isaac, MD  calcium citrate-vitamin D (CITRACAL+D) 315-200 MG-UNIT tablet Take 1 tablet by mouth daily.   Yes [provider]  carvedilol (COREG) 6.25 MG tablet Take 1 tablet by mouth twice daily 06/04/19  Yes Bensimhon, Shaune Pascal, MD  Coenzyme Q10 (COQ10) 400 MG CAPS Take 400 mg by mouth 2 (two) times daily.   Yes [provider]  Flax OIL Take 1 capsule by mouth daily.   Yes [provider]  Lactobacillus (ULTIMATE PROBIOTIC FORMULA) CAPS Take 1 capsule by mouth daily.    Yes [provider]  lisinopril (ZESTRIL) 5 MG tablet Take 1 tablet by mouth twice daily 02/07/19  Yes Larey Dresser, MD  Magnesium  400 MG TABS Take 400 mg by mouth 2 (two) times daily.    Yes [provider]  Omega-3 Fatty Acids (FISH OIL CONCENTRATE PO) Take 1 capsule by mouth daily.   Yes [provider]  oxymetazoline (AFRIN) 0.05 % nasal spray Place 1 spray into both nostrils at bedtime as needed for congestion.   Yes [provider]  pantoprazole (PROTONIX) 40 MG tablet Take 1 tablet (40  mg total) by mouth daily. 01/22/19  Yes Brunetta Genera, MD  predniSONE (DELTASONE) 20 MG tablet Take 3 tablets (60 mg total) by mouth daily. Take on days 1-5 of chemotherapy. 01/16/19  Yes Brunetta Genera, MD  spironolactone (ALDACTONE) 25 MG tablet Take 0.5 tablets (12.5 mg total) by mouth daily. Needs appt for further refills 05/23/19  Yes Larey Dresser, MD  acetaminophen (TYLENOL) 325 MG tablet Take 2 tablets (650 mg total) by mouth every 6 (six) hours as needed for mild pain. 03/18/17   Barrett, Erin R, PA-C  lidocaine-prilocaine (EMLA) cream Apply to affected area once 01/16/19   Brunetta Genera, MD  LORazepam (ATIVAN) 0.5 MG tablet TAKE 1 TABLET BY MOUTH EVERY 6 HOURS AS NEEDED FOR NAUSEA FOR VOMITING 04/17/19   Brunetta Genera, MD  ondansetron (ZOFRAN) 8 MG tablet Take 1 tablet (8 mg total) by mouth 2 (two) times daily as needed for refractory nausea / vomiting. Start on day 3 after cyclophosphamide. 01/16/19   Brunetta Genera, MD  prochlorperazine (COMPAZINE) 10 MG tablet Take 1 tablet (10 mg total) by mouth every 6 (six) hours as needed (Nausea or vomiting). 01/16/19   Brunetta Genera, MD     Vital Signs: BP 125/87   Pulse 65   Temp 98.1 F (36.7 C) (Oral)   Resp 18   SpO2 100%   Physical Exam awake, alert.  Chest clear to auscultation bilaterally.  Clean, intact right chest wall Port-A-Cath.  Heart with regular rate and rhythm.  Abdomen obese, soft, positive bowel sounds, nontender.  No lower extremity edema.  Imaging: No results found.  Labs:  CBC: Recent Labs    03/26/19 0825 04/16/19 0936 05/07/19 0905 06/07/19 1000  WBC 8.1 7.2 7.7 4.3  HGB 11.7* 11.1* 11.0* 13.1  HCT 34.2* 33.3* 32.9* 39.4  PLT 282 303 276 281    COAGS: Recent Labs    12/25/18 1020 01/08/19 1323 06/07/19 1000  INR 1.1 1.0 1.0  APTT  --  28  --     BMP: Recent Labs    03/05/19 1028 03/26/19 0825 04/16/19 0936 05/07/19 0905  NA 137 137 137 140  K 4.3 4.2 4.1  4.2  CL 105 105 104 105  CO2 24 24 23 24   GLUCOSE 135* 120* 145* 113*  BUN 14 16 13 12   CALCIUM 9.2 9.3 9.4 9.2  CREATININE 1.00 0.92 0.95 0.90  GFRNONAA >60 >60 >60 >60  GFRAA >60 >60 >60 >60    LIVER FUNCTION TESTS: Recent Labs    03/05/19 1028 03/26/19 0825 04/16/19 0936 05/07/19 0905  BILITOT 0.5 0.4 0.5 0.3  AST 16 14* 17 15  ALT 16 13 15 16   ALKPHOS 70 65 59 68  PROT 7.4 7.0 7.0 6.9  ALBUMIN 3.8 3.9 3.8 3.7    Assessment and Plan: Patient with history of diffuse large B-cell lymphoma; status post completion of treatment.  He presents today for Port-A-Cath removal.  Details/risks of procedure, including but not limited to, internal bleeding, infection, injury to adjacent structures  discussed with patient with his understanding and consent.   Electronically Signed: D. Rowe Robert, PA-C 06/07/2019, 11:32 AM   I spent a total of 20 minutes  at the the patient's bedside AND on the patient's hospital floor or unit, greater than 50% of which was counseling/coordinating care for Port-A-Cath removal

## 2019-06-12 ENCOUNTER — Encounter (HOSPITAL_COMMUNITY): Payer: Self-pay | Admitting: Cardiology

## 2019-06-12 ENCOUNTER — Other Ambulatory Visit: Payer: Self-pay

## 2019-06-12 ENCOUNTER — Ambulatory Visit (HOSPITAL_COMMUNITY)
Admission: RE | Admit: 2019-06-12 | Discharge: 2019-06-12 | Disposition: A | Discharge status: Home / Self Care | Payer: BC Managed Care – PPO | Source: Ambulatory Visit | Attending: Cardiology | Admitting: Cardiology

## 2019-06-12 VITALS — BP 121/80 | HR 61 | Wt 233.4 lb

## 2019-06-12 DIAGNOSIS — Z7982 Long term (current) use of aspirin: Secondary | ICD-10-CM | POA: Diagnosis not present

## 2019-06-12 DIAGNOSIS — I480 Paroxysmal atrial fibrillation: Secondary | ICD-10-CM | POA: Insufficient documentation

## 2019-06-12 DIAGNOSIS — I252 Old myocardial infarction: Secondary | ICD-10-CM | POA: Diagnosis not present

## 2019-06-12 DIAGNOSIS — I25118 Atherosclerotic heart disease of native coronary artery with other forms of angina pectoris: Secondary | ICD-10-CM

## 2019-06-12 DIAGNOSIS — I110 Hypertensive heart disease with heart failure: Secondary | ICD-10-CM | POA: Diagnosis not present

## 2019-06-12 DIAGNOSIS — Z955 Presence of coronary angioplasty implant and graft: Secondary | ICD-10-CM | POA: Insufficient documentation

## 2019-06-12 DIAGNOSIS — C8330 Diffuse large B-cell lymphoma, unspecified site: Secondary | ICD-10-CM | POA: Insufficient documentation

## 2019-06-12 DIAGNOSIS — I5022 Chronic systolic (congestive) heart failure: Secondary | ICD-10-CM | POA: Diagnosis not present

## 2019-06-12 DIAGNOSIS — Z953 Presence of xenogenic heart valve: Secondary | ICD-10-CM | POA: Insufficient documentation

## 2019-06-12 DIAGNOSIS — Z87891 Personal history of nicotine dependence: Secondary | ICD-10-CM | POA: Diagnosis not present

## 2019-06-12 DIAGNOSIS — Z95828 Presence of other vascular implants and grafts: Secondary | ICD-10-CM | POA: Diagnosis not present

## 2019-06-12 DIAGNOSIS — I2510 Atherosclerotic heart disease of native coronary artery without angina pectoris: Secondary | ICD-10-CM | POA: Diagnosis not present

## 2019-06-12 DIAGNOSIS — E785 Hyperlipidemia, unspecified: Secondary | ICD-10-CM | POA: Insufficient documentation

## 2019-06-12 DIAGNOSIS — Z951 Presence of aortocoronary bypass graft: Secondary | ICD-10-CM | POA: Insufficient documentation

## 2019-06-12 DIAGNOSIS — Z79899 Other long term (current) drug therapy: Secondary | ICD-10-CM | POA: Insufficient documentation

## 2019-06-12 DIAGNOSIS — I255 Ischemic cardiomyopathy: Secondary | ICD-10-CM | POA: Insufficient documentation

## 2019-06-12 DIAGNOSIS — I10 Essential (primary) hypertension: Secondary | ICD-10-CM | POA: Diagnosis present

## 2019-06-12 NOTE — Patient Instructions (Signed)
Please call our social worker Raquel Sarna at [phone].  Follow up in 6 months.

## 2019-06-13 NOTE — Progress Notes (Signed)
Patient ID: William Contreras, male   DOB: 1955-08-08, 63 y.o.   MRN: [id number] PCP: Dr. Lin Landsman Cardiology: Dr. Aundra Dubin  63 y.o. with history of HTN was admitted in 8/16 with acute systolic CHF => volume overloaded, short of breath.  Troponin was mildly elevated to 0.04.  Echo showed EF 30-35%.  He was diuresed and had cardiac cath.  This showed diffuse severe disease but the LAD disease was relatively mild to CABG not recommended.  He had PCI with DES to mLCx and proximal to mid RCA.  Cardiac MRI/MRA chest showed EF 39%, bicuspid aortic valve with probable moderate aortic stenosis, 4.9 cm ascending aorta.  MRA in 12/17 showed ascending aorta dimension up to 5.1 cm.  He saw Dr. Servando Snare for aorta evaluation. Echo in 4/18 showed EF 45% with inferolateral hypokinesis, moderate AS with mean gradient 38 mmHg and AVA 1.3 cm^2, mild to moderate aortic insufficiency, bicuspid aortic valve, ascending aorta 5.1 cm.  I talked with Dr. Servando Snare and decided that it was time for AVR + ascending aorta replacement.  LHC in 8/18 showed progressive disease as described below in HPI.   Patient had bioprosthetic AVR, ascending aorta replacement, and CABG with LIMA-LAD and SVG-ramus in 9/18. He did not have any significant peri-operative complications.  He was noted to have a run of atrial fibrillation at cardiac rehab.  30 day monitor worn in 11/18 showed episodes of atrial fibrillation.  I recommended starting DOAC and stopping ASA but he refused.  He does not tolerate statins. He qualifies for Repatha but has not wanted to take it.   I next recommended Zetia but he refused this also.   He was diagnosed with diffuse large B cell lymphoma in 6/20. He was treated with R-CEOP regimen (no anthracycline) which has now been completed. Echo in 6/20 showed EF 40-45% with moderate LVH, normal RV, normal bioprosthetic aortic valve.   He returns for followup of CAD and valvular heart disease.  He is doing well, has recovered from  chemotherapy.  He reports no significant exertional dyspnea.  He is able to do all his ADLs and work in the yard without problems.  No chest pain.  No palpitations.     Labs (8/16): K 4.1, creatinine 1.0, HCT 42.6, LDL 200 Labs (9/16): K 4, creatinine 1.04, BNP 383 Labs (11/16): K 4.1, creatinine 0.99 Labs (12/16): LDL 152, HDL 37 (not taking Crestor) Labs (3/17): LDL 92, HDL 36, K 4.1, creatinine 0.92 Labs (6/17): LDL 83, HDL 38, K 3.8, creatinine 1.04 Labs (11/17): creatinine 0.94 Labs (9/18): K 4.4, creatinine 0.93 Labs (10/18): LDL 130 Labs (11/18): K 4.1, creatinine 0.9 Labs (2/19): K 4.1, creatinine 1.0 Labs (10/20): K 4.2, creatinine 0.9 Labs (11/20): hgb 13.1  ECG: NSR with LBBB 132 msec alternating with accelerated junctional rhythm rate 90s  PMH: 1. HTN 2. CAD: NSTEMI/CHF in 8/16.  LHC (8/16) showed 40-50% mLAD, 50% prox ramus, 80% mLCx, total occlusion moderate OM2, 50% pRCA, 80% mRCA, 50% dRCA.  He had DES to prox and mid RCA and mid LCx.   - LHC (8/18): 50-60% mLAD, 40-50% dLM, severe diffuse disease in LCx involving moderate ramus and 3 relatively small OMs.   - 9/18 CABG with LIMA-LAD and SVG-ramus.  3. Chronic systolic CHF: Ischemic cardiomyopathy.   - Echo (8/16) with EF 30-35%, diffuse hypokinesis, mild LVH, ?bicuspid aortic valve with mild AS and mild AI, 5.1 cm aortic root.  - Cardiac MRI (12/16): EF 39%, mild LV dilation and  moderate LVH, inferior/inferolateral/anterolateral hypokinesis, LGE in the inferior and inferolateral walls, normal RV size and systolic function, mild aortic insufficiency, bicuspid aortic valve with probable moderate aortic stenosis.  - Echo (4/17): EF 40-45%, inferolateral hypokinesis, bicuspid aortic valve with moderate aortic stenosis (mean gradient 33 mmHg), moderate AI, 4.5 cm aortic root. - Echo (4/18): EF 45% with inferolateral hypokinesis, moderate AS with mean gradient 38 mmHg and AVA 1.3 cm^2, mild to moderate aortic insufficiency,  bicuspid aortic valve, ascending aorta 5.1 cm.  - Echo (11/18): EF 45-50%, mild LV dilation, bioprosthetic aortic valve with mean gradient 11 mmHg, mild mitral regurgitation.   - Echo (6/20):  EF 40-45% with moderate LVH, normal RV, normal bioprosthetic aortic valve.  4. Hyperlipidemia: Myalgias with all statins.  5. Bicuspid aortic valve: Moderate AS by 12/16 MRI, mild AS by 8/16 echo.  Moderate AS/moderate AI by 4/17 echo.  - Echo (4/18) with moderate AS and mild to moderate AI.  Mean AoV gradient 38 mmHg with AVA 1.3 cm^2.  - Bioprosthetic aortic valve in 9/18.  6. Ascending aortic aneurysm: 4.9 cm ascending aorta by MRA chest in 12/16.  Aortic root 4.5 cm on 4/17 echo.  - MRA chest 12/17 with 5.1 cm ascending aorta.  - Echo 4/18 with 5.1 cm ascending aorta.  - Ascending aorta replaced in 9/18.  7. Carotid dopplers (7/17) with no significant disease.  - Carotid dopplers (11/19): Mild LICA stenosis.  8. Atrial fibrillation: Paroxysmal.   - 30 day monitor (11/18) with runs of atrial fibrillation noted.   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  Lives in [place].   FH: No CAD that he knows of. +HTN.  No FH of aneurysm or bicuspid aortic valve.   ROS: All systems reviewed and negative except as per HPI.   Current Outpatient Medications  Medication Sig Dispense Refill  . acetaminophen (TYLENOL) 325 MG tablet Take 2 tablets (650 mg total) by mouth every 6 (six) hours as needed for mild pain.    Marland Kitchen aspirin EC 81 MG tablet Take 1 tablet (81 mg total) by mouth daily.    . calcium citrate-vitamin D (CITRACAL+D) 315-200 MG-UNIT tablet Take 1 tablet by mouth daily.    . carvedilol (COREG) 6.25 MG tablet Take 1 tablet by mouth twice daily 60 tablet 0  . Coenzyme Q10 (COQ10) 400 MG CAPS Take 400 mg by mouth 2 (two) times daily.    . Flax OIL Take 1 capsule by mouth daily.    Marland Kitchen lisinopril (ZESTRIL) 5 MG tablet Take 1 tablet by mouth twice daily 60 tablet 2   . LORazepam (ATIVAN) 0.5 MG tablet TAKE 1 TABLET BY MOUTH EVERY 6 HOURS AS NEEDED FOR NAUSEA FOR VOMITING 30 tablet 0  . Magnesium 400 MG TABS Take 400 mg by mouth 2 (two) times daily.     . Omega-3 Fatty Acids (FISH OIL CONCENTRATE PO) Take 1 capsule by mouth daily.    . ondansetron (ZOFRAN) 8 MG tablet Take 1 tablet (8 mg total) by mouth 2 (two) times daily as needed for refractory nausea / vomiting. Start on day 3 after cyclophosphamide. 30 tablet 1  . oxymetazoline (AFRIN) 0.05 % nasal spray Place 1 spray into both nostrils at bedtime as needed for congestion.    . pantoprazole (PROTONIX) 40 MG tablet Take 1 tablet (40 mg total) by mouth daily. 30 tablet 1  . spironolactone (ALDACTONE) 25 MG tablet Take 0.5 tablets (12.5 mg total) by mouth  daily. Needs appt for further refills 15 tablet 0  . Lactobacillus (ULTIMATE PROBIOTIC FORMULA) CAPS Take 1 capsule by mouth daily.      No current facility-administered medications for this encounter.    BP 121/80   Pulse 61   Wt 105.9 kg (233 lb 6.4 oz)   SpO2 99%   BMI 33.49 kg/m  General: NAD Neck: No JVD, no thyromegaly or thyroid nodule.  Lungs: Clear to auscultation bilaterally with normal respiratory effort. CV: Nondisplaced PMI.  Heart regular S1/S2, no S3/S4, 2/6 early SEM RUSB.  No peripheral edema.  No carotid bruit.  Normal pedal pulses.  Abdomen: Soft, nontender, no hepatosplenomegaly, no distention.  Skin: Intact without lesions or rashes.  Neurologic: Alert and oriented x 3.  Psych: Normal affect. Extremities: No clubbing or cyanosis.  HEENT: Normal.   Assessment/Plan: 1. CAD: S/p ACS 8/16 with DES to mLCx and proximal to mid RCA.  CABG with LIMA-LAD and SVG-ramus in 9/18. No chest pain. - Continue ASA 81.  - Cannot tolerate statins.  - He refuses to take Repatha, Praluent, or Zetia.  He has had extensive atherosclerosis so I think this is a bad idea and have told him so, but he has been adamant about managing his lipids with  diet only.  Hopefully he will have some protection for at least a number of years from his CABG.   2. Chronic systolic CHF: Ischemic cardiomyopathy.  EF 30-35% by echo in 2016.  Cardiac MRI showed EF up to 39% in 12/16.  Echo 4/17 showed EF improved to 40-45%.  Echo in 4/18 with EF 45%.  Post-op echo in 11/18 with EF 45-50%.  Echo in 6/20 with EF 40-45%.  NYHA class I-II symptoms now, looks euvolemic.  - Continue current Coreg 6.25 mg bid.  - Continue lisinopril 5 mg daily (had to decrease from bid due to orthostasis).  Would not transition to [facility], he is reticent to change his medications.  - Continue current spironolactone.  - Recent BMET was stable.   3. Bicuspid aortic valve: moderate AI, moderate AS by echo in 4/17.  Bioprosthetic AVR in 9/18.  Valve looked ok on post-op echo in 11/18 and on echo in 6/20.  4. Hyperlipidemia: As above, cannot tolerate statins and refuses other agents despite my recommendation.   5. Ascending aortic aneurysm: 4.9 cm on MRA chest in 12/16.  Likely related to bicuspid aortic valve.  4.5 cm aortic root on 4/17 echo.  MRA in 12/17 and echo in 4/18 showed increase in ascending aorta dimension to 5.1 cm.  In 9/18, had ascending aorta repair. Follows with Dr. Servando Snare. 6. Atrial fibrillation: Paroxysmal.  First noted at cardiac rehab, later noted on 30 day monitor in 11/18.   With most recent guidelines, he should be able to take a DOAC (has bioprosthetic - not mechanical - aortic valve).  I have discussed stroke risk with him extensively in the past and recommended that he replace ASA with a DOAC. He did not want to do this despite my recommendation.  Today, he was noted to be in NSR with runs of regular rhythm in the 90s that appears to be accelerated junctional rhythm (not atrial fibrillation, no flutter waves).   Followup in 6 months.    Loralie Champagne 06/13/2019

## 2019-06-16 ENCOUNTER — Other Ambulatory Visit (HOSPITAL_COMMUNITY): Payer: Self-pay | Admitting: Cardiology

## 2019-06-17 NOTE — Progress Notes (Signed)
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 male who has been referred to [place] by Dr. Lanelle Bal in Cardiothoracic surgery for evaluation and management of his Newly diagnosed Diffuse Large B-Cell Lymphoma. The pt reports that he is doing well overall.   The pt reports that he has had a history of CABG x2 alongside replacing his aortic valve in 2018. His last ECHO was in November 2019 which revealed an LV EF of 45%. He was seen to have enlarged lymph nodes incidentally on a follow up CT with his cardiothoracic surgeon in May 2020. He denies fevers, chills, night sweats, unexpected weight loss. He has not noticed any new lumps or bumps and denies feeling differently presently as compared to 6 months ago. He denies any limiting symptoms when walking or with mild exercise. He denies any CHF exacerbations. The pt also notes a history of HLD and notes that he took statins for a little while which cause numbness and tingling in his toes and made him "feel mentally affected." He notes that he has well controlled HTN and endorses much improved GERD.  The pt notes that he does not have sleep apnea, there was once concern, but he lost weight to address this concern.  Of note prior to the patient's visit today, pt has had a PET/CT completed on 12/19/18 with results revealing "Hypermetabolic adenopathy within the chest, abdomen, and upper pelvis, most consistent with a lymphoproliferative process. 2. Aortic Atherosclerosis."   Most recent lab results (12/25/18) of CBC is as follows: all values are WNL.  On review of systems, pt reports stable energy levels, stable weight, and denies fevers, chills, night sweats, unexpected weight loss, new  lumps or bumps, difficulty breathing, SOB, CP, problems swallowing, dental concerns, painful teeth, recent infections, pain along the spine, abdominal pains, leg swelling, and any other symptoms.   On PMHx the pt reports s/p replacement of ascending aorta and aortic valve in 2018, HLD, HTN, CHF. On Social Hx the pt reports that he quit smoking cigarettes more than 20 years ago and denies any alcohol consumption. Works as a Environmental education officer. On Family Hx the pt reports brother with unspecified leukemia, father killed when pt was 17 years old. Mother well and living in 64s without medical concerns.  Interval History:   William Contreras returns today for management and evaluation of his recently diagnosed Diffuse Large B-Cell Lymphoma. He is here after 6 cycles of R-CEOP. The patient's last visit with [place] was on 05/07/2019. The pt reports that he is doing well overall.  The pt reports that he has had no new concerns in the interim. Pt has had his port removed and is healing well. He was confused about getting another PET/CT scan but will set it up immediately.   Lab results today (06/18/19) of CBC w/diff and CMP is as follows: all values are WNL except for Glucose at 120. 06/18/2019 LDH at 177  On review of systems, pt denies any other symptoms.   MEDICAL HISTORY:  Past Medical History:  Diagnosis Date   CHF (congestive heart failure) (HCC)    Coronary artery disease    Essential hypertension    GERD (gastroesophageal reflux disease)    Heart murmur  Morbid obesity ([place])    Neuromuscular disorder ([place])    burning in both feet - pt. thinks its related to statin    Sleep apnea    told that he was borderline, but he has lost 50 lbs & is feeling much better     SURGICAL HISTORY: Past Surgical History:  Procedure Laterality Date   AORTIC VALVE REPLACEMENT N/A 03/14/2017   Procedure: AORTIC VALVE REPLACEMENT (AVR);  Surgeon: Grace Isaac, MD;  Location: [facility];  Service: Open Heart  Surgery;  Laterality: N/A;  Using 69mm Edwards Magna Ease Bioprosthetic Aortic Valve   CARDIAC CATHETERIZATION N/A 03/10/2015   Procedure: Right/Left Heart Cath and Coronary Angiography;  Surgeon: Larey Dresser, MD;  Location: [facility];  Service: Cardiovascular;  Laterality: N/A;   CARDIAC CATHETERIZATION N/A 03/10/2015   Procedure: Coronary Stent Intervention;  Surgeon: Leonie Man, MD;  Location: [facility];  Service: Cardiovascular;  Laterality: N/A;   CORONARY ARTERY BYPASS GRAFT N/A 03/14/2017   Procedure: CORONARY ARTERY BYPASS GRAFTING (CABG), ON PUMP, TIMES TWO, USING LEFT INTERNAL MAMMARY ARTERY AND ENDOSCOPICALLY HARVESTED LEFT GREATER SAPHENOUS VEIN;  Surgeon: Grace Isaac, MD;  Location: [facility];  Service: Open Heart Surgery;  Laterality: N/A;  LIMA to LAD SVG to RAMUS INTERMEDIATE   IR IMAGING GUIDED PORT INSERTION  01/08/2019   IR REMOVAL TUN ACCESS W/ PORT W/O FL MOD SED  06/07/2019   REPLACEMENT ASCENDING AORTA N/A 03/14/2017   Procedure: REPLACEMENT ASCENDING AORTA WITH WHEAT PROCEDURE;  Surgeon: Grace Isaac, MD;  Location: [facility];  Service: Open Heart Surgery;  Laterality: N/A;  Using 60mm Hemashield Platinum Woven Double Velour Vascular Graft   TEE WITHOUT CARDIOVERSION N/A 03/14/2017   Procedure: TRANSESOPHAGEAL ECHOCARDIOGRAM (TEE);  Surgeon: Grace Isaac, MD;  Location: [facility];  Service: Open Heart Surgery;  Laterality: N/A;   TONSILLECTOMY      SOCIAL HISTORY: Social History   Socioeconomic History   Marital status: Married    Spouse name: Not on file   Number of children: Not on file   Years of education: Not on file   Highest education level: Not on file  Occupational History   Not on file  Social Needs   Financial resource strain: Not on file   Food insecurity    Worry: Not on file    Inability: Not on file   Transportation needs    Medical: Not on file    Non-medical: Not on file  Tobacco Use   Smoking  status: Former Smoker    Packs/day: 1.00    Years: 15.00    Pack years: 15.00    Types: Cigarettes   Smokeless tobacco: Never Used   Tobacco comment: quit smoking ~ 20 yrs ago.  Substance and Sexual Activity   Alcohol use: No    Alcohol/week: 0.0 standard drinks    Comment: prev drank - none in ~ 25 yrs.   Drug use: No    Comment: prev smoked MJ, none in 25 yrs.   Sexual activity: Yes  Lifestyle   Physical activity    Days per week: Not on file    Minutes per session: Not on file   Stress: Not on file  Relationships   Social connections    Talks on phone: Not on file    Gets together: Not on file    Attends religious service: Not on file    Active member of club or organization: Not on file  Attends meetings of clubs or organizations: Not on file    Relationship status: Not on file   Intimate partner violence    Fear of current or ex partner: Not on file    Emotionally abused: Not on file    Physically abused: Not on file    Forced sexual activity: Not on file  Other Topics Concern   Not on file  Social History Narrative   Pt lives in [place] with his wife and son.  He has 2 step children and another child - all grown and out of the house.    FAMILY HISTORY: Family History  Problem Relation Age of Onset   Other Father        died young in MVA.   Heart attack Mother        alive @ 105.   Other Brother        A & W   Other Sister        A & W    ALLERGIES:  is allergic to lipitor [atorvastatin] and penicillins.  MEDICATIONS:  Current Outpatient Medications  Medication Sig Dispense Refill   acetaminophen (TYLENOL) 325 MG tablet Take 2 tablets (650 mg total) by mouth every 6 (six) hours as needed for mild pain.     aspirin EC 81 MG tablet Take 1 tablet (81 mg total) by mouth daily.     calcium citrate-vitamin D (CITRACAL+D) 315-200 MG-UNIT tablet Take 1 tablet by mouth daily.     carvedilol (COREG) 6.25 MG tablet Take 1 tablet by mouth  twice daily 60 tablet 0   Coenzyme Q10 (COQ10) 400 MG CAPS Take 400 mg by mouth 2 (two) times daily.     Flax OIL Take 1 capsule by mouth daily.     Lactobacillus (ULTIMATE PROBIOTIC FORMULA) CAPS Take 1 capsule by mouth daily.      lisinopril (ZESTRIL) 5 MG tablet Take 1 tablet by mouth twice daily 60 tablet 2   LORazepam (ATIVAN) 0.5 MG tablet TAKE 1 TABLET BY MOUTH EVERY 6 HOURS AS NEEDED FOR NAUSEA FOR VOMITING 30 tablet 0   Magnesium 400 MG TABS Take 400 mg by mouth 2 (two) times daily.      Omega-3 Fatty Acids (FISH OIL CONCENTRATE PO) Take 1 capsule by mouth daily.     ondansetron (ZOFRAN) 8 MG tablet Take 1 tablet (8 mg total) by mouth 2 (two) times daily as needed for refractory nausea / vomiting. Start on day 3 after cyclophosphamide. 30 tablet 1   oxymetazoline (AFRIN) 0.05 % nasal spray Place 1 spray into both nostrils at bedtime as needed for congestion.     pantoprazole (PROTONIX) 40 MG tablet Take 1 tablet (40 mg total) by mouth daily. 30 tablet 1   spironolactone (ALDACTONE) 25 MG tablet Take 0.5 tablets (12.5 mg total) by mouth daily. Needs appt for further refills 15 tablet 0   No current facility-administered medications for this visit.     REVIEW OF SYSTEMS:   A 10+ POINT REVIEW OF SYSTEMS WAS OBTAINED including neurology, dermatology, psychiatry, cardiac, respiratory, lymph, extremities, GI, GU, Musculoskeletal, constitutional, breasts, reproductive, HEENT.  All pertinent positives are noted in the HPI.  All others are negative.   PHYSICAL EXAMINATION: ECOG PERFORMANCE STATUS: 1 - Symptomatic but completely ambulatory  Vitals:   06/18/19 0920  BP: (!) 139/98  Pulse: 66  Resp: 18  Temp: 98 F (36.7 C)  SpO2: 99%   Filed Weights   06/18/19 0920  Weight:  233 lb 12.8 oz (106.1 kg)   .Body mass index is 33.55 kg/m.   GENERAL:alert, in no acute distress and comfortable SKIN: no acute rashes, no significant lesions EYES: conjunctiva are pink and  non-injected, sclera anicteric OROPHARYNX: MMM, no exudates, no oropharyngeal erythema or ulceration NECK: supple, no JVD LYMPH:  no palpable lymphadenopathy in the cervical, axillary or inguinal regions LUNGS: clear to auscultation b/l with normal respiratory effort HEART: regular rate & rhythm ABDOMEN:  normoactive bowel sounds , non tender, not distended. No palpable hepatosplenomegaly.  Extremity: no pedal edema PSYCH: alert & oriented x 3 with fluent speech NEURO: no focal motor/sensory deficits  LABORATORY DATA:  I have reviewed the data as listed  . CBC Latest Ref Rng & Units 06/18/2019 06/07/2019 05/07/2019  WBC 4.0 - 10.5 K/uL 5.7 4.3 7.7  Hemoglobin 13.0 - 17.0 g/dL 13.7 13.1 11.0(L)  Hematocrit 39.0 - 52.0 % 40.4 39.4 32.9(L)  Platelets 150 - 400 K/uL 272 281 276    . CMP Latest Ref Rng & Units 06/18/2019 05/07/2019 04/16/2019  Glucose 70 - 99 mg/dL 120(H) 113(H) 145(H)  BUN 8 - 23 mg/dL 13 12 13   Creatinine 0.61 - 1.24 mg/dL 1.05 0.90 0.95  Sodium 135 - 145 mmol/L 137 140 137  Potassium 3.5 - 5.1 mmol/L 4.3 4.2 4.1  Chloride 98 - 111 mmol/L 103 105 104  CO2 22 - 32 mmol/L 25 24 23   Calcium 8.9 - 10.3 mg/dL 9.4 9.2 9.4  Total Protein 6.5 - 8.1 g/dL 7.5 6.9 7.0  Total Bilirubin 0.3 - 1.2 mg/dL 0.4 0.3 0.5  Alkaline Phos 38 - 126 U/L 47 68 59  AST 15 - 41 U/L 19 15 17   ALT 0 - 44 U/L 18 16 15     12/25/18 Left Retroperitoneal Biopsy:    RADIOGRAPHIC STUDIES: I have personally reviewed the radiological images as listed and agreed with the findings in the report. NM PET Image Restag (PS) Skull Base To Thigh  Result Date: 06/20/2019 CLINICAL DATA:  Subsequent treatment strategy for large B-cell lymphoma. EXAM: NUCLEAR MEDICINE PET SKULL BASE TO THIGH TECHNIQUE: 11.4 mCi F-18 FDG was injected intravenously. Full-ring PET imaging was performed from the skull base to thigh after the radiotracer. CT data was obtained and used for attenuation correction and anatomic  localization. Fasting blood glucose: 105 mg/dl COMPARISON:  PET-CT 04/09/2019 FINDINGS: Mediastinal blood pool activity: SUV max 2.3 Liver activity: SUV max 4.78 NECK: No hypermetabolic lymph nodes in the neck. Incidental CT findings: Port in the anterior chest wall with tip in distal SVC. CHEST: No hypermetabolic mediastinal or hilar nodes. No suspicious pulmonary nodules on the CT scan. Incidental CT findings: Post CABG ABDOMEN/PELVIS: No abnormal hypermetabolic activity within the liver, pancreas, adrenal glands, or spleen. No hypermetabolic lymph nodes in the abdomen or pelvis. Incidental CT findings: None SKELETON: Mild activity in the proximal femoral diaphyses. Similar comparison exam. Incidental CT findings: none IMPRESSION: 1. No evidence of active lymphoma on skull base to thigh FDG PET scan. 2. Mild activity in the proximal femur medullary space is likely related to benign hematopoiesis Electronically Signed   By: Suzy Bouchard M.D.   On: 06/20/2019 18:03   IR Removal Tun Access W/ Port W/O FL  Result Date: 06/07/2019 CLINICAL DATA:  History of diffuse large B-cell lymphoma, completed chemotherapy. Patient is no longer in need of Port a [employer] a catheter was placed by myself on 01/08/2019 and functioned well throughout duration of usage. There is no clinical  sign or concern infection. EXAM: REMOVAL OF IMPLANTED TUNNELED PORT-A-CATH MEDICATIONS: Clindamycin 900 mg IV. The antibiotic was administered within 1 hour prior to the start of the procedure. ANESTHESIA/SEDATION: Moderate (conscious) sedation was employed during this procedure. A total of Versed 2 mg and Fentanyl 100 mcg was administered intravenously. Moderate Sedation Time: 17 minutes. The patient's level of consciousness and vital signs were monitored continuously by radiology nursing throughout the procedure under my direct supervision. FLUOROSCOPY TIME:  None PROCEDURE: Informed written consent was obtained from the patient after  a discussion of the risk, benefits and alternatives to the procedure. The patient was positioned supine on the fluoroscopy table and the right chest Port-A-Cath site was prepped with chlorhexidine. A sterile gown and gloves were worn during the procedure. Local anesthesia was provided with 1% lidocaine with epinephrine. A timeout was performed prior to the initiation of the procedure. An incision was made overlying the Port-A-Cath with a #15 scalpel. Utilizing sharp and blunt dissection, the Port-A-Cath was removed completely. The pocked was irrigated with sterile saline. Wound closure was performed with subcutaneous 2-0 Vicryl, subcuticular 4-0 Vicryl, Dermabond and Steri-Strips. A dressing was placed. The patient tolerated the procedure well without immediate post procedural complication. FINDINGS: Successful removal of implant Port-A-Cath without immediate post procedural complication. IMPRESSION: Successful removal of implanted Port-A-Cath. Electronically Signed   By: Sandi Mariscal M.D.   On: 06/07/2019 12:14    ASSESSMENT & PLAN:  63 y.o. male with  1. Recenty diagnosed Diffuse Large B-Cell Lymphoma, Stage III-A Labs upon initial presentation from 12/25/18, blood counts normal including WBC at 5.3k, HGB at 14.9 and PLT at 220k 12/25/18 Left retroperitoneal biopsy revealed Diffuse Large B-Cell Lymphoma 12/19/18 PET/CT revealed "Hypermetabolic adenopathy within the chest, abdomen, and upper pelvis, most consistent with a lymphoproliferative process. 2. Aortic Atherosclerosis." 01/01/19 Hep B and Hep C negative  01/04/19 ECHO revealed LV EF of 40-45%, moderately increased left ventricular wall thickness, impaired relaxation, and moderate hypokinesis of LV, entire inferior wall and apical segment. Discussed that the general treatment recommendation is up to 6 cycles of R-CHOP. However, given his cardiac considerations I would recommend substituting Etoposide for Doxorubicin for R-CEOP regimen to limit risk of  worsening heart failure. Discussed that this choice can decrease treatment efficacy but would aim to preserve heart function. Pt strongly prefers outpatient treatment and to avoid anthracycline in any form, which I also recommend.  04/09/2019 PET/CT Whole Body Scan ([id number]) which revealed "1. Essentially complete metabolic response. No new or residual hypermetabolic lymphadenopathy in the chest, abdomen or pelvis. Deauville score 2.  Aortic Atherosclerosis (ICD10-I70.0)."  PLAN: -Discussed pt labwork today, 06/18/19; blood counts and chemistries are normal -Discussed 06/18/2019 LDH is WNL at 177 -No lymphadenopathy observed on clinical exam -Advised pt that 90-95% of lymphoma recurrence  occurs in the first 18-24 months  -Will begin monitoring pt with labs and clinical examination every 3-4 months  -Will get a rpt PET/CT ASAP for post-treatment baseline -Will see back in 3 months with labs   FOLLOW UP: Post treatment PET/CT ASAP RTC with Dr Irene Limbo with labs in 3 months  The total time spent in the appt was 25 minutes and more than 50% was on counseling and direct patient cares.  All of the patient's questions were answered with apparent satisfaction. The patient knows to call the clinic with any problems, questions or concerns.   Sullivan Lone MD [place] AAHIVMS [facility] Hematology/Oncology Physician [place]  (Office):  [phone] (Work cell):  [phone] (Fax):           [phone]  06/18/2019 8:47 AM  I, Yevette Edwards, am acting as a scribe for Dr. Sullivan Lone.   .I have reviewed the above documentation for accuracy and completeness, and I agree with the above. Brunetta Genera MD   ADDENDUM  Called patient with PET/CT results -- no evidence of residual or recurrent lymphoma. -continue f/u for active surveillance in 3 months with labs.  Brunetta Genera MD

## 2019-06-18 ENCOUNTER — Other Ambulatory Visit: Payer: Self-pay

## 2019-06-18 ENCOUNTER — Inpatient Hospital Stay: Payer: BC Managed Care – PPO | Attending: Hematology

## 2019-06-18 ENCOUNTER — Inpatient Hospital Stay (HOSPITAL_BASED_OUTPATIENT_CLINIC_OR_DEPARTMENT_OTHER): Payer: BC Managed Care – PPO | Admitting: Hematology

## 2019-06-18 VITALS — BP 139/98 | HR 66 | Temp 98.0°F | Resp 18 | Ht 70.0 in | Wt 233.8 lb

## 2019-06-18 DIAGNOSIS — E669 Obesity, unspecified: Secondary | ICD-10-CM | POA: Diagnosis not present

## 2019-06-18 DIAGNOSIS — C8338 Diffuse large B-cell lymphoma, lymph nodes of multiple sites: Secondary | ICD-10-CM

## 2019-06-18 DIAGNOSIS — Z5111 Encounter for antineoplastic chemotherapy: Secondary | ICD-10-CM

## 2019-06-18 DIAGNOSIS — I10 Essential (primary) hypertension: Secondary | ICD-10-CM | POA: Diagnosis not present

## 2019-06-18 DIAGNOSIS — Z9221 Personal history of antineoplastic chemotherapy: Secondary | ICD-10-CM | POA: Insufficient documentation

## 2019-06-18 LAB — CMP (CANCER CENTER ONLY)
ALT: 18 U/L (ref 0–44)
AST: 19 U/L (ref 15–41)
Albumin: 4 g/dL (ref 3.5–5.0)
Alkaline Phosphatase: 47 U/L (ref 38–126)
Anion gap: 9 (ref 5–15)
BUN: 13 mg/dL (ref 8–23)
CO2: 25 mmol/L (ref 22–32)
Calcium: 9.4 mg/dL (ref 8.9–10.3)
Chloride: 103 mmol/L (ref 98–111)
Creatinine: 1.05 mg/dL (ref 0.61–1.24)
GFR, Est AFR Am: >60 mL/min (ref >60)
GFR, Estimated: >60 mL/min (ref >60)
Glucose, Bld: 120 mg/dL — ABNORMAL HIGH (ref 70–99)
Potassium: 4.3 mmol/L (ref 3.5–5.1)
Sodium: 137 mmol/L (ref 135–145)
Total Bilirubin: 0.4 mg/dL (ref 0.3–1.2)
Total Protein: 7.5 g/dL (ref 6.5–8.1)

## 2019-06-18 LAB — CBC WITH DIFFERENTIAL/PLATELET
Abs Immature Granulocytes: 0.02 10*3/uL (ref 0.00–0.07)
Basophils Absolute: 0 10*3/uL (ref 0.0–0.1)
Basophils Relative: 0 %
Eosinophils Absolute: 0.2 10*3/uL (ref 0.0–0.5)
Eosinophils Relative: 3 %
HCT: 40.4 % (ref 39.0–52.0)
Hemoglobin: 13.7 g/dL (ref 13.0–17.0)
Immature Granulocytes: 0 %
Lymphocytes Relative: 20 %
Lymphs Abs: 1.1 10*3/uL (ref 0.7–4.0)
MCH: 31.7 pg (ref 26.0–34.0)
MCHC: 33.9 g/dL (ref 30.0–36.0)
MCV: 93.5 fL (ref 80.0–100.0)
Monocytes Absolute: 0.6 10*3/uL (ref 0.1–1.0)
Monocytes Relative: 10 %
Neutro Abs: 3.8 10*3/uL (ref 1.7–7.7)
Neutrophils Relative %: 67 %
Platelets: 272 10*3/uL (ref 150–400)
RBC: 4.32 MIL/uL (ref 4.22–5.81)
RDW: 13.1 % (ref 11.5–15.5)
WBC: 5.7 10*3/uL (ref 4.0–10.5)
nRBC: 0 % (ref 0.0–0.2)

## 2019-06-18 LAB — LACTATE DEHYDROGENASE: LDH: 177 U/L (ref 98–192)

## 2019-06-19 ENCOUNTER — Telehealth: Payer: Self-pay | Admitting: Hematology

## 2019-06-19 NOTE — Telephone Encounter (Signed)
Scheduled per los. Called and left msg. Mailed printout  °

## 2019-06-20 ENCOUNTER — Other Ambulatory Visit: Payer: Self-pay

## 2019-06-20 ENCOUNTER — Encounter (HOSPITAL_COMMUNITY)
Admission: RE | Admit: 2019-06-20 | Discharge: 2019-06-20 | Disposition: A | Discharge status: Home / Self Care | Payer: BC Managed Care – PPO | Source: Ambulatory Visit | Attending: Hematology | Admitting: Hematology

## 2019-06-20 DIAGNOSIS — Z5111 Encounter for antineoplastic chemotherapy: Secondary | ICD-10-CM | POA: Diagnosis present

## 2019-06-20 DIAGNOSIS — C8338 Diffuse large B-cell lymphoma, lymph nodes of multiple sites: Secondary | ICD-10-CM | POA: Insufficient documentation

## 2019-06-20 LAB — GLUCOSE, CAPILLARY: Glucose-Capillary: 105 mg/dL — ABNORMAL HIGH (ref 70–99)

## 2019-06-20 MED ORDER — FLUDEOXYGLUCOSE F - 18 (FDG) INJECTION
11.4000 | Freq: Once | INTRAVENOUS | Status: AC
  Administered 2019-06-20: 16:00:00 11.4 via INTRAVENOUS

## 2019-06-30 ENCOUNTER — Other Ambulatory Visit (HOSPITAL_COMMUNITY): Payer: Self-pay | Admitting: Internal Medicine

## 2019-07-01 ENCOUNTER — Other Ambulatory Visit (HOSPITAL_COMMUNITY): Payer: Self-pay

## 2019-07-01 MED ORDER — CARVEDILOL 6.25 MG PO TABS: 6.2500 mg | ORAL_TABLET | Freq: Two times a day (BID) | ORAL | 0 refills | Status: DC

## 2019-07-09 ENCOUNTER — Encounter (HOSPITAL_COMMUNITY): Payer: BC Managed Care – PPO | Admitting: Cardiology

## 2019-07-10 ENCOUNTER — Other Ambulatory Visit (HOSPITAL_COMMUNITY): Payer: Self-pay | Admitting: Cardiology

## 2019-07-31 ENCOUNTER — Other Ambulatory Visit (HOSPITAL_COMMUNITY): Payer: Self-pay | Admitting: Internal Medicine

## 2019-07-31 ENCOUNTER — Other Ambulatory Visit (HOSPITAL_COMMUNITY): Payer: Self-pay | Admitting: Cardiology

## 2019-08-21 ENCOUNTER — Other Ambulatory Visit (HOSPITAL_COMMUNITY): Payer: Self-pay | Admitting: Cardiology

## 2019-09-16 ENCOUNTER — Other Ambulatory Visit (HOSPITAL_COMMUNITY): Payer: Self-pay | Admitting: Cardiology

## 2019-09-16 ENCOUNTER — Inpatient Hospital Stay: Payer: BC Managed Care – PPO | Attending: Hematology | Admitting: Hematology

## 2019-09-16 ENCOUNTER — Other Ambulatory Visit: Payer: Self-pay

## 2019-09-16 ENCOUNTER — Other Ambulatory Visit (HOSPITAL_COMMUNITY): Payer: Self-pay | Admitting: Internal Medicine

## 2019-09-16 ENCOUNTER — Inpatient Hospital Stay: Payer: BC Managed Care – PPO

## 2019-09-16 VITALS — BP 145/101 | HR 59 | Temp 98.2°F | Resp 17 | Ht 70.0 in | Wt 237.7 lb

## 2019-09-16 DIAGNOSIS — C8338 Diffuse large B-cell lymphoma, lymph nodes of multiple sites: Secondary | ICD-10-CM

## 2019-09-16 DIAGNOSIS — I10 Essential (primary) hypertension: Secondary | ICD-10-CM | POA: Diagnosis not present

## 2019-09-16 DIAGNOSIS — C8333 Diffuse large B-cell lymphoma, intra-abdominal lymph nodes: Secondary | ICD-10-CM | POA: Insufficient documentation

## 2019-09-16 DIAGNOSIS — Z87891 Personal history of nicotine dependence: Secondary | ICD-10-CM | POA: Insufficient documentation

## 2019-09-16 DIAGNOSIS — I700 Atherosclerosis of aorta: Secondary | ICD-10-CM | POA: Insufficient documentation

## 2019-09-16 DIAGNOSIS — Z952 Presence of prosthetic heart valve: Secondary | ICD-10-CM | POA: Diagnosis not present

## 2019-09-16 LAB — CBC WITH DIFFERENTIAL/PLATELET
Abs Immature Granulocytes: 0.06 10*3/uL (ref 0.00–0.07)
Basophils Absolute: 0 10*3/uL (ref 0.0–0.1)
Basophils Relative: 0 %
Eosinophils Absolute: 0.1 10*3/uL (ref 0.0–0.5)
Eosinophils Relative: 2 %
HCT: 43.4 % (ref 39.0–52.0)
Hemoglobin: 15 g/dL (ref 13.0–17.0)
Immature Granulocytes: 1 %
Lymphocytes Relative: 31 %
Lymphs Abs: 1.5 10*3/uL (ref 0.7–4.0)
MCH: 31.4 pg (ref 26.0–34.0)
MCHC: 34.6 g/dL (ref 30.0–36.0)
MCV: 90.8 fL (ref 80.0–100.0)
Monocytes Absolute: 0.6 10*3/uL (ref 0.1–1.0)
Monocytes Relative: 12 %
Neutro Abs: 2.6 10*3/uL (ref 1.7–7.7)
Neutrophils Relative %: 54 %
Platelets: 249 10*3/uL (ref 150–400)
RBC: 4.78 MIL/uL (ref 4.22–5.81)
RDW: 12.4 % (ref 11.5–15.5)
WBC: 4.9 10*3/uL (ref 4.0–10.5)
nRBC: 0 % (ref 0.0–0.2)

## 2019-09-16 LAB — CMP (CANCER CENTER ONLY)
ALT: 19 U/L (ref 0–44)
AST: 19 U/L (ref 15–41)
Albumin: 4.1 g/dL (ref 3.5–5.0)
Alkaline Phosphatase: 48 U/L (ref 38–126)
Anion gap: 7 (ref 5–15)
BUN: 18 mg/dL (ref 8–23)
CO2: 27 mmol/L (ref 22–32)
Calcium: 9.5 mg/dL (ref 8.9–10.3)
Chloride: 103 mmol/L (ref 98–111)
Creatinine: 1.01 mg/dL (ref 0.61–1.24)
GFR, Est AFR Am: >60 mL/min (ref >60)
GFR, Estimated: >60 mL/min (ref >60)
Glucose, Bld: 92 mg/dL (ref 70–99)
Potassium: 4.1 mmol/L (ref 3.5–5.1)
Sodium: 137 mmol/L (ref 135–145)
Total Bilirubin: 0.5 mg/dL (ref 0.3–1.2)
Total Protein: 7.5 g/dL (ref 6.5–8.1)

## 2019-09-16 LAB — LACTATE DEHYDROGENASE: LDH: 257 U/L — ABNORMAL HIGH (ref 98–192)

## 2019-09-16 NOTE — Progress Notes (Signed)
HEMATOLOGY/ONCOLOGY CLINIC NOTE  Date of Service: 09/16/2019  Patient Care Team: Angelina Sheriff, MD as PCP - General (Family Medicine)  CHIEF COMPLAINTS/PURPOSE OF CONSULTATION:  Continue mx of  Diffuse Large B-Cell Lymphoma  HISTORY OF PRESENTING ILLNESS:   William Contreras is a wonderful 64 y.o. male who has been referred to [place] by Dr. Lanelle Bal in Cardiothoracic surgery for evaluation and management of his Newly diagnosed Diffuse Large B-Cell Lymphoma. The pt reports that he is doing well overall.   The pt reports that he has had a history of CABG x2 alongside replacing his aortic valve in 2018. His last ECHO was in November 2019 which revealed an LV EF of 45%. He was seen to have enlarged lymph nodes incidentally on a follow up CT with his cardiothoracic surgeon in May 2020. He denies fevers, chills, night sweats, unexpected weight loss. He has not noticed any new lumps or bumps and denies feeling differently presently as compared to 6 months ago. He denies any limiting symptoms when walking or with mild exercise. He denies any CHF exacerbations. The pt also notes a history of HLD and notes that he took statins for a little while which cause numbness and tingling in his toes and made him "feel mentally affected." He notes that he has well controlled HTN and endorses much improved GERD.  The pt notes that he does not have sleep apnea, there was once concern, but he lost weight to address this concern.  Of note prior to the patient's visit today, pt has had a PET/CT completed on 12/19/18 with results revealing "Hypermetabolic adenopathy within the chest, abdomen, and upper pelvis, most consistent with a lymphoproliferative process. 2. Aortic Atherosclerosis."   Most recent lab results (12/25/18) of CBC is as follows: all values are WNL.  On review of systems, pt reports stable energy levels, stable weight, and denies fevers, chills, night sweats, unexpected weight loss, new  lumps or bumps, difficulty breathing, SOB, CP, problems swallowing, dental concerns, painful teeth, recent infections, pain along the spine, abdominal pains, leg swelling, and any other symptoms.   On PMHx the pt reports s/p replacement of ascending aorta and aortic valve in 2018, HLD, HTN, CHF. On Social Hx the pt reports that he quit smoking cigarettes more than 20 years ago and denies any alcohol consumption. Works as a Environmental education officer. On Family Hx the pt reports brother with unspecified leukemia, father killed when pt was 56 years old. Mother well and living in 15s without medical concerns.  Interval History:   William Contreras returns today for management and evaluation of his recently diagnosed Diffuse Large B-Cell Lymphoma. He is here after 6 cycles of R-CEOP. The patient's last visit with [place] was on 06/18/2019. The pt reports that he is doing well overall.  The pt reports that he has been well and denies any concerns in the interim. Pt has still has some indigestion but notes that it is nothing new. Pt has had his port removed and denies any issues. He is choosing not to receive the COVID19 vaccine.   Of note since the patient's last visit, pt has had PET/CT ([id number]) completed on 06/20/2019 with results revealing "1. No evidence of active lymphoma on skull base to thigh FDG PET scan. 2. 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 09/16/2019 LDH at 257  On review of systems, pt reports dyspepsia and denies fevers, chills, night sweats, SOB, chest pain, abdominal pain, leg swelling and any other symptoms.   MEDICAL HISTORY:  Past Medical History:  Diagnosis Date  . CHF (congestive heart failure) ([place])   . Coronary artery disease   . Essential hypertension   . GERD (gastroesophageal reflux disease)   . Heart murmur   . Morbid obesity ([place])   .  Neuromuscular disorder ([place])    burning in both feet - pt. thinks its related to statin   . Sleep apnea    told that he was borderline, but he has lost 50 lbs & is feeling much better     SURGICAL HISTORY: Past Surgical History:  Procedure Laterality Date  . AORTIC VALVE REPLACEMENT N/A 03/14/2017   Procedure: AORTIC VALVE REPLACEMENT (AVR);  Surgeon: Grace Isaac, MD;  Location: [facility];  Service: Open Heart Surgery;  Laterality: N/A;  Using 69mm Edwards Magna Ease Bioprosthetic Aortic Valve  . CARDIAC CATHETERIZATION N/A 03/10/2015   Procedure: Right/Left Heart Cath and Coronary Angiography;  Surgeon: Larey Dresser, MD;  Location: [facility];  Service: Cardiovascular;  Laterality: N/A;  . CARDIAC CATHETERIZATION N/A 03/10/2015   Procedure: Coronary Stent Intervention;  Surgeon: Leonie Man, MD;  Location: [facility];  Service: Cardiovascular;  Laterality: N/A;  . CORONARY ARTERY BYPASS GRAFT N/A 03/14/2017   Procedure: CORONARY ARTERY BYPASS GRAFTING (CABG), ON PUMP, TIMES TWO, USING LEFT INTERNAL MAMMARY ARTERY AND ENDOSCOPICALLY HARVESTED LEFT GREATER SAPHENOUS VEIN;  Surgeon: Grace Isaac, MD;  Location: [facility];  Service: Open Heart Surgery;  Laterality: N/A;  LIMA to LAD SVG to RAMUS INTERMEDIATE  . IR IMAGING GUIDED PORT INSERTION  01/08/2019  . IR REMOVAL TUN ACCESS W/ PORT W/O FL MOD SED  06/07/2019  . REPLACEMENT ASCENDING AORTA N/A 03/14/2017   Procedure: REPLACEMENT ASCENDING AORTA WITH WHEAT PROCEDURE;  Surgeon: Grace Isaac, MD;  Location: [facility];  Service: Open Heart Surgery;  Laterality: N/A;  Using 93mm Hemashield Platinum Woven Double Velour Vascular Graft  . TEE WITHOUT CARDIOVERSION N/A 03/14/2017   Procedure: TRANSESOPHAGEAL ECHOCARDIOGRAM (TEE);  Surgeon: Grace Isaac, MD;  Location: [facility];  Service: Open Heart Surgery;  Laterality: N/A;  . TONSILLECTOMY      SOCIAL HISTORY: Social History   Socioeconomic History  . Marital status:  Married    Spouse name: Not on file  . Number of children: Not on file  . Years of education: Not on file  . Highest education level: Not on file  Occupational History  . Not on file  Tobacco Use  . Smoking status: Former Smoker    Packs/day: 1.00    Years: 15.00    Pack years: 15.00    Types: Cigarettes  . Smokeless tobacco: Never Used  . Tobacco comment: quit smoking ~ 20 yrs ago.  Substance and Sexual Activity  . Alcohol use: No    Alcohol/week: 0.0 standard drinks    Comment: prev drank - none in ~ 25 yrs.  . Drug use: No    Comment: prev smoked MJ, none in 25 yrs.  . Sexual activity: Yes  Other Topics Concern  . Not on file  Social History Narrative   Pt lives in [place] with his wife and son.  He has 2 step children and another child - all grown and out of the house.   Social Determinants of Health   Financial Resource Strain:   .  Difficulty of Paying Living Expenses: Not on file  Food Insecurity:   . Worried About Charity fundraiser in the Last Year: Not on file  . Ran Out of Food in the Last Year: Not on file  Transportation Needs:   . Lack of Transportation (Medical): Not on file  . Lack of Transportation (Non-Medical): Not on file  Physical Activity:   . Days of Exercise per Week: Not on file  . Minutes of Exercise per Session: Not on file  Stress:   . Feeling of Stress : Not on file  Social Connections:   . Frequency of Communication with Friends and Family: Not on file  . Frequency of Social Gatherings with Friends and Family: Not on file  . Attends Religious Services: Not on file  . Active Member of Clubs or Organizations: Not on file  . Attends Archivist Meetings: Not on file  . Marital Status: Not on file  Intimate Partner Violence:   . Fear of Current or Ex-Partner: Not on file  . Emotionally Abused: Not on file  . Physically Abused: Not on file  . Sexually Abused: Not on file    FAMILY HISTORY: Family History  Problem Relation  Age of Onset  . Other Father        died young in [place].  Marland Kitchen Heart attack Mother        alive @ 24.  . Other Brother        A & W  . Other Sister        A & W    ALLERGIES:  is allergic to lipitor [atorvastatin] and penicillins.  MEDICATIONS:  Current Outpatient Medications  Medication Sig Dispense Refill  . acetaminophen (TYLENOL) 325 MG tablet Take 2 tablets (650 mg total) by mouth every 6 (six) hours as needed for mild pain. (Patient not taking: Reported on 06/18/2019)    . aspirin EC 81 MG tablet Take 1 tablet (81 mg total) by mouth daily.    . calcium citrate-vitamin D (CITRACAL+D) 315-200 MG-UNIT tablet Take 1 tablet by mouth daily.    . carvedilol (COREG) 6.25 MG tablet Take 1 tablet by mouth twice daily 60 tablet 0  . clindamycin (CLEOCIN) 150 MG capsule Take 150 mg by mouth 4 (four) times daily.    . Coenzyme Q10 (COQ10) 400 MG CAPS Take 400 mg by mouth 2 (two) times daily.    . Flax OIL Take 1 capsule by mouth daily.    . Lactobacillus (ULTIMATE PROBIOTIC FORMULA) CAPS Take 1 capsule by mouth daily.     Marland Kitchen lisinopril (ZESTRIL) 5 MG tablet Take 1 tablet (5 mg total) by mouth daily. 90 tablet 3  . LORazepam (ATIVAN) 0.5 MG tablet TAKE 1 TABLET BY MOUTH EVERY 6 HOURS AS NEEDED FOR NAUSEA FOR VOMITING (Patient not taking: Reported on 06/18/2019) 30 tablet 0  . Magnesium 400 MG TABS Take 400 mg by mouth 2 (two) times daily.     . Omega-3 Fatty Acids (FISH OIL CONCENTRATE PO) Take 1 capsule by mouth daily.    . ondansetron (ZOFRAN) 8 MG tablet Take 1 tablet (8 mg total) by mouth 2 (two) times daily as needed for refractory nausea / vomiting. Start on day 3 after cyclophosphamide. (Patient not taking: Reported on 06/18/2019) 30 tablet 1  . oxymetazoline (AFRIN) 0.05 % nasal spray Place 1 spray into both nostrils at bedtime as needed for congestion.    . pantoprazole (PROTONIX) 40 MG tablet Take 1 tablet (  40 mg total) by mouth daily. (Patient not taking: Reported on 06/18/2019) 30 tablet 1    . spironolactone (ALDACTONE) 25 MG tablet TAKE 1/2 (ONE-HALF) TABLET BY MOUTH ONCE DAILY. APPOINTMENT REQUIRED FOR FUTHER REFILLS. 15 tablet 0   No current facility-administered medications for this visit.    REVIEW OF SYSTEMS:   A 10+ POINT REVIEW OF SYSTEMS WAS OBTAINED including neurology, dermatology, psychiatry, cardiac, respiratory, lymph, extremities, GI, GU, Musculoskeletal, constitutional, breasts, reproductive, HEENT.  All pertinent positives are noted in the HPI.  All others are negative.   PHYSICAL EXAMINATION: ECOG PERFORMANCE STATUS: 1 - Symptomatic but completely ambulatory  Vitals:   09/16/19 1030 09/16/19 1034  BP: (!) 155/126 (!) 145/101  Pulse: (!) 59   Resp: 17   Temp: 98.2 F (36.8 C)   SpO2: 100%    Filed Weights   09/16/19 1030  Weight: 237 lb 11.2 oz (107.8 kg)   .Body mass index is 34.11 kg/m.   GENERAL:alert, in no acute distress and comfortable SKIN: no acute rashes, no significant lesions EYES: conjunctiva are pink and non-injected, sclera anicteric OROPHARYNX: MMM, no exudates, no oropharyngeal erythema or ulceration NECK: supple, no JVD LYMPH:  no palpable lymphadenopathy in the cervical, axillary or inguinal regions LUNGS: clear to auscultation b/l with normal respiratory effort HEART: regular rate & rhythm ABDOMEN:  normoactive bowel sounds , non tender, not distended. No palpable hepatosplenomegaly.  Extremity: no pedal edema PSYCH: alert & oriented x 3 with fluent speech NEURO: no focal motor/sensory deficits  LABORATORY DATA:  I have reviewed the data as listed  . CBC Latest Ref Rng & Units 09/16/2019 06/18/2019 06/07/2019  WBC 4.0 - 10.5 K/uL 4.9 5.7 4.3  Hemoglobin 13.0 - 17.0 g/dL 15.0 13.7 13.1  Hematocrit 39.0 - 52.0 % 43.4 40.4 39.4  Platelets 150 - 400 K/uL 249 272 281    . CMP Latest Ref Rng & Units 09/16/2019 06/18/2019 05/07/2019  Glucose 70 - 99 mg/dL 92 120(H) 113(H)  BUN 8 - 23 mg/dL 18 13 12   Creatinine 0.61 - 1.24  mg/dL 1.01 1.05 0.90  Sodium 135 - 145 mmol/L 137 137 140  Potassium 3.5 - 5.1 mmol/L 4.1 4.3 4.2  Chloride 98 - 111 mmol/L 103 103 105  CO2 22 - 32 mmol/L 27 25 24   Calcium 8.9 - 10.3 mg/dL 9.5 9.4 9.2  Total Protein 6.5 - 8.1 g/dL 7.5 7.5 6.9  Total Bilirubin 0.3 - 1.2 mg/dL 0.5 0.4 0.3  Alkaline Phos 38 - 126 U/L 48 47 68  AST 15 - 41 U/L 19 19 15   ALT 0 - 44 U/L 19 18 16     12/25/18 Left Retroperitoneal Biopsy:    RADIOGRAPHIC STUDIES: I have personally reviewed the radiological images as listed and agreed with the findings in the report. No results found.  ASSESSMENT & PLAN:  64 y.o. male with  1. Recenty diagnosed Diffuse Large B-Cell Lymphoma, Stage III-A Labs upon initial presentation from 12/25/18, blood counts normal including WBC at 5.3k, HGB at 14.9 and PLT at 220k 12/25/18 Left retroperitoneal biopsy revealed Diffuse Large B-Cell Lymphoma 12/19/18 PET/CT revealed "Hypermetabolic adenopathy within the chest, abdomen, and upper pelvis, most consistent with a lymphoproliferative process. 2. Aortic Atherosclerosis." 01/01/19 Hep B and Hep C negative  01/04/19 ECHO revealed LV EF of 40-45%, moderately increased left ventricular wall thickness, impaired relaxation, and moderate hypokinesis of LV, entire inferior wall and apical segment. Discussed that the general treatment recommendation is up to 6 cycles of R-CHOP.  However, given his cardiac considerations I would recommend substituting Etoposide for Doxorubicin for R-CEOP regimen to limit risk of worsening heart failure. Discussed that this choice can decrease treatment efficacy but would aim to preserve heart function. Pt strongly prefers outpatient treatment and to avoid anthracycline in any form, which I also recommend.  04/09/2019 PET/CT Whole Body Scan ([id number]) which revealed "1. Essentially complete metabolic response. No new or residual hypermetabolic lymphadenopathy in the chest, abdomen or pelvis. Deauville score 2.   Aortic Atherosclerosis (ICD10-I70.0)."  PLAN: -Discussed pt labwork today, 09/16/19; blood counts and chemistries are normal, LDH is somewhat elevated at 257 -Discussed 06/20/2019 PET/CT ([id number]) which revealed "1. No evidence of active lymphoma on skull base to thigh FDG PET scan." -Will continue to monitor with labs and clinic visits every 3 months  -No lymphadenopathy observed on clinical exam -Recommend the pt receive the COVID19 vaccine - the pt would not like to take the vaccine  -Will see back in 3 months with labs  FOLLOW UP: RTC with Dr Irene Limbo with labs in 3 months   The total time spent in the appt was 20 minutes and more than 50% was on counseling and direct patient cares.  All of the patient's questions were answered with apparent satisfaction. The patient knows to call the clinic with any problems, questions or concerns .   Sullivan Lone MD [place] AAHIVMS [facility] Hematology/Oncology Physician [facility]  (Office):       [phone] (Work cell):  [phone] (Fax):           [phone]  09/16/2019 12:07 PM  I, Yevette Edwards, am acting as a scribe for Dr. Sullivan Lone.   .I have reviewed the above documentation for accuracy and completeness, and I agree with the above. Brunetta Genera MD

## 2019-09-19 ENCOUNTER — Telehealth: Payer: Self-pay | Admitting: Hematology

## 2019-09-19 NOTE — Telephone Encounter (Signed)
Scheduled per 03/08 los, patient has been called and voicemail was left.

## 2019-11-01 ENCOUNTER — Other Ambulatory Visit (HOSPITAL_COMMUNITY): Payer: Self-pay | Admitting: Cardiology

## 2019-12-05 ENCOUNTER — Other Ambulatory Visit (HOSPITAL_COMMUNITY): Payer: Self-pay | Admitting: Cardiology

## 2019-12-12 ENCOUNTER — Ambulatory Visit: Payer: BC Managed Care – PPO | Admitting: Cardiothoracic Surgery

## 2019-12-18 ENCOUNTER — Other Ambulatory Visit: Payer: Self-pay

## 2019-12-18 ENCOUNTER — Inpatient Hospital Stay (HOSPITAL_BASED_OUTPATIENT_CLINIC_OR_DEPARTMENT_OTHER): Payer: BC Managed Care – PPO | Admitting: Hematology

## 2019-12-18 ENCOUNTER — Inpatient Hospital Stay: Payer: BC Managed Care – PPO | Attending: Hematology

## 2019-12-18 VITALS — BP 127/85 | HR 56 | Temp 98.1°F | Resp 20 | Ht 70.0 in | Wt 239.4 lb

## 2019-12-18 DIAGNOSIS — C8338 Diffuse large B-cell lymphoma, lymph nodes of multiple sites: Secondary | ICD-10-CM

## 2019-12-18 DIAGNOSIS — I509 Heart failure, unspecified: Secondary | ICD-10-CM | POA: Insufficient documentation

## 2019-12-18 DIAGNOSIS — Z87891 Personal history of nicotine dependence: Secondary | ICD-10-CM | POA: Diagnosis not present

## 2019-12-18 DIAGNOSIS — Z7982 Long term (current) use of aspirin: Secondary | ICD-10-CM | POA: Insufficient documentation

## 2019-12-18 DIAGNOSIS — I110 Hypertensive heart disease with heart failure: Secondary | ICD-10-CM | POA: Insufficient documentation

## 2019-12-18 DIAGNOSIS — Z8249 Family history of ischemic heart disease and other diseases of the circulatory system: Secondary | ICD-10-CM | POA: Insufficient documentation

## 2019-12-18 DIAGNOSIS — Z79899 Other long term (current) drug therapy: Secondary | ICD-10-CM | POA: Diagnosis not present

## 2019-12-18 LAB — CBC WITH DIFFERENTIAL/PLATELET
Abs Immature Granulocytes: 0.01 10*3/uL (ref 0.00–0.07)
Basophils Absolute: 0 10*3/uL (ref 0.0–0.1)
Basophils Relative: 0 %
Eosinophils Absolute: 0.1 10*3/uL (ref 0.0–0.5)
Eosinophils Relative: 1 %
HCT: 43.3 % (ref 39.0–52.0)
Hemoglobin: 15.3 g/dL (ref 13.0–17.0)
Immature Granulocytes: 0 %
Lymphocytes Relative: 33 %
Lymphs Abs: 2.1 10*3/uL (ref 0.7–4.0)
MCH: 32.6 pg (ref 26.0–34.0)
MCHC: 35.3 g/dL (ref 30.0–36.0)
MCV: 92.1 fL (ref 80.0–100.0)
Monocytes Absolute: 0.6 10*3/uL (ref 0.1–1.0)
Monocytes Relative: 10 %
Neutro Abs: 3.5 10*3/uL (ref 1.7–7.7)
Neutrophils Relative %: 56 %
Platelets: 238 10*3/uL (ref 150–400)
RBC: 4.7 MIL/uL (ref 4.22–5.81)
RDW: 11.5 % (ref 11.5–15.5)
WBC: 6.3 10*3/uL (ref 4.0–10.5)
nRBC: 0 % (ref 0.0–0.2)

## 2019-12-18 LAB — CMP (CANCER CENTER ONLY)
ALT: 18 U/L (ref 0–44)
AST: 15 U/L (ref 15–41)
Albumin: 4 g/dL (ref 3.5–5.0)
Alkaline Phosphatase: 45 U/L (ref 38–126)
Anion gap: 11 (ref 5–15)
BUN: 19 mg/dL (ref 8–23)
CO2: 23 mmol/L (ref 22–32)
Calcium: 9.6 mg/dL (ref 8.9–10.3)
Chloride: 105 mmol/L (ref 98–111)
Creatinine: 1.06 mg/dL (ref 0.61–1.24)
GFR, Est AFR Am: >60 mL/min (ref >60)
GFR, Estimated: >60 mL/min (ref >60)
Glucose, Bld: 99 mg/dL (ref 70–99)
Potassium: 4.4 mmol/L (ref 3.5–5.1)
Sodium: 139 mmol/L (ref 135–145)
Total Bilirubin: 0.7 mg/dL (ref 0.3–1.2)
Total Protein: 7.7 g/dL (ref 6.5–8.1)

## 2019-12-18 LAB — LACTATE DEHYDROGENASE: LDH: 155 U/L (ref 98–192)

## 2019-12-18 NOTE — Progress Notes (Signed)
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 male who has been referred to [place] by Dr. Lanelle Bal in Cardiothoracic surgery for evaluation and management of his Newly diagnosed Diffuse Large B-Cell Lymphoma. The pt reports that he is doing well overall.   The pt reports that he has had a history of CABG x2 alongside replacing his aortic valve in 2018. His last ECHO was in November 2019 which revealed an LV EF of 45%. He was seen to have enlarged lymph nodes incidentally on a follow up CT with his cardiothoracic surgeon in May 2020. He denies fevers, chills, night sweats, unexpected weight loss. He has not noticed any new lumps or bumps and denies feeling differently presently as compared to 6 months ago. He denies any limiting symptoms when walking or with mild exercise. He denies any CHF exacerbations. The pt also notes a history of HLD and notes that he took statins for a little while which cause numbness and tingling in his toes and made him "feel mentally affected." He notes that he has well controlled HTN and endorses much improved GERD.  The pt notes that he does not have sleep apnea, there was once concern, but he lost weight to address this concern.  Of note prior to the patient's visit today, pt has had a PET/CT completed on 12/19/18 with results revealing "Hypermetabolic adenopathy within the chest, abdomen, and upper pelvis, most consistent with a lymphoproliferative process. 2. Aortic Atherosclerosis."   Most recent lab results (12/25/18) of CBC is as follows: all values are WNL.  On review of systems, pt reports stable energy levels, stable weight, and denies fevers, chills, night sweats, unexpected weight loss, new  lumps or bumps, difficulty breathing, SOB, CP, problems swallowing, dental concerns, painful teeth, recent infections, pain along the spine, abdominal pains, leg swelling, and any other symptoms.   On PMHx the pt reports s/p replacement of ascending aorta and aortic valve in 2018, HLD, HTN, CHF. On Social Hx the pt reports that he quit smoking cigarettes more than 20 years ago and denies any alcohol consumption. Works as a Environmental education officer. On Family Hx the pt reports brother with unspecified leukemia, father killed when pt was 79 years old. Mother well and living in 34s without medical concerns.  Interval History:   William Contreras returns today for management and evaluation of his recently diagnosed Diffuse Large B-Cell Lymphoma. He is here after 6 cycles of R-CEOP. The patient's last visit with [place] was on 09/16/2019. The pt reports that he is doing well overall.  The pt reports that he feels well and his functionality continues to improve. His energy levels are close to baseline.   Lab results today (12/18/19) of CBC w/diff and CMP is as follows: all values are WNL.  12/18/2019 LDH at 155  On review of systems, pt denies fevers, chills, night sweats, new lumps/bumps, abdominal pain and any other symptoms.   MEDICAL HISTORY:  Past Medical History:  Diagnosis Date  . CHF (congestive heart failure) ([place])   . Coronary artery disease   . Essential hypertension   . GERD (gastroesophageal reflux disease)   . Heart murmur   . Morbid obesity ([place])   . 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 thinks its related to statin   . Sleep apnea    told that he was borderline, but he has lost 50 lbs & is feeling much better     SURGICAL HISTORY: Past Surgical History:  Procedure Laterality Date  . AORTIC VALVE REPLACEMENT N/A 03/14/2017   Procedure: AORTIC VALVE REPLACEMENT (AVR);  Surgeon: Grace Isaac, MD;  Location: [place];  Service: Open Heart Surgery;  Laterality: N/A;  Using  60mm Edwards Magna Ease Bioprosthetic Aortic Valve  . CARDIAC CATHETERIZATION N/A 03/10/2015   Procedure: Right/Left Heart Cath and Coronary Angiography;  Surgeon: Larey Dresser, MD;  Location: [facility];  Service: Cardiovascular;  Laterality: N/A;  . CARDIAC CATHETERIZATION N/A 03/10/2015   Procedure: Coronary Stent Intervention;  Surgeon: Leonie Man, MD;  Location: [place] [facility];  Service: Cardiovascular;  Laterality: N/A;  . CORONARY ARTERY BYPASS GRAFT N/A 03/14/2017   Procedure: CORONARY ARTERY BYPASS GRAFTING (CABG), ON PUMP, TIMES TWO, USING LEFT INTERNAL MAMMARY ARTERY AND ENDOSCOPICALLY HARVESTED LEFT GREATER SAPHENOUS VEIN;  Surgeon: Grace Isaac, MD;  Location: [facility];  Service: Open Heart Surgery;  Laterality: N/A;  LIMA to LAD SVG to RAMUS INTERMEDIATE  . IR IMAGING GUIDED PORT INSERTION  01/08/2019  . IR REMOVAL TUN ACCESS W/ PORT W/O FL MOD SED  06/07/2019  . REPLACEMENT ASCENDING AORTA N/A 03/14/2017   Procedure: REPLACEMENT ASCENDING AORTA WITH WHEAT PROCEDURE;  Surgeon: Grace Isaac, MD;  Location: [facility];  Service: Open Heart Surgery;  Laterality: N/A;  Using 50mm Hemashield Platinum Woven Double Velour Vascular Graft  . TEE WITHOUT CARDIOVERSION N/A 03/14/2017   Procedure: TRANSESOPHAGEAL ECHOCARDIOGRAM (TEE);  Surgeon: Grace Isaac, MD;  Location: [place];  Service: Open Heart Surgery;  Laterality: N/A;  . TONSILLECTOMY      SOCIAL HISTORY: Social History   Socioeconomic History  . Marital status: Married    Spouse name: Not on file  . Number of children: Not on file  . Years of education: Not on file  . Highest education level: Not on file  Occupational History  . Not on file  Tobacco Use  . Smoking status: Former Smoker    Packs/day: 1.00    Years: 15.00    Pack years: 15.00    Types: Cigarettes  . Smokeless tobacco: Never Used  . Tobacco comment: quit smoking ~ 20 yrs ago.  Substance and Sexual Activity  . Alcohol use: No     Alcohol/week: 0.0 standard drinks    Comment: prev drank - none in ~ 25 yrs.  . Drug use: No    Comment: prev smoked MJ, none in 25 yrs.  . Sexual activity: Yes  Other Topics Concern  . Not on file  Social History Narrative   Pt lives in [place] with his wife and son.  He has 2 step children and another child - all grown and out of the house.   Social Determinants of Health   Financial Resource Strain:   . Difficulty of Paying Living Expenses:   Food Insecurity:   . Worried About Charity fundraiser in the Last Year:   . Arboriculturist in the Last Year:   Transportation Needs:   . Film/video editor (Medical):   Marland Kitchen Lack of Transportation (Non-Medical):   Physical Activity:   . Days of Exercise per Week:   . Minutes of Exercise per Session:   Stress:   .  Feeling of Stress :   Social Connections:   . Frequency of Communication with Friends and Family:   . Frequency of Social Gatherings with Friends and Family:   . Attends Religious Services:   . Active Member of Clubs or Organizations:   . Attends Archivist Meetings:   Marland Kitchen Marital Status:   Intimate Partner Violence:   . Fear of Current or Ex-Partner:   . Emotionally Abused:   Marland Kitchen Physically Abused:   . Sexually Abused:     FAMILY HISTORY: Family History  Problem Relation Age of Onset  . Other Father        died young in [place].  Marland Kitchen Heart attack Mother        alive @ 58.  . Other Brother        A & W  . Other Sister        A & W    ALLERGIES:  is allergic to lipitor [atorvastatin] and penicillins.  MEDICATIONS:  Current Outpatient Medications  Medication Sig Dispense Refill  . acetaminophen (TYLENOL) 325 MG tablet Take 2 tablets (650 mg total) by mouth every 6 (six) hours as needed for mild pain. (Patient not taking: Reported on 06/18/2019)    . aspirin EC 81 MG tablet Take 1 tablet (81 mg total) by mouth daily.    . calcium citrate-vitamin D (CITRACAL+D) 315-200 MG-UNIT tablet Take 1 tablet by mouth  daily.    . carvedilol (COREG) 6.25 MG tablet Take 1 tablet by mouth twice daily 60 tablet 6  . clindamycin (CLEOCIN) 150 MG capsule Take 150 mg by mouth 4 (four) times daily.    . Coenzyme Q10 (COQ10) 400 MG CAPS Take 400 mg by mouth 2 (two) times daily.    . Flax OIL Take 1 capsule by mouth daily.    . Lactobacillus (ULTIMATE PROBIOTIC FORMULA) CAPS Take 1 capsule by mouth daily.     Marland Kitchen lisinopril (ZESTRIL) 5 MG tablet Take 1 tablet by mouth twice daily 60 tablet 0  . LORazepam (ATIVAN) 0.5 MG tablet TAKE 1 TABLET BY MOUTH EVERY 6 HOURS AS NEEDED FOR NAUSEA FOR VOMITING (Patient not taking: Reported on 06/18/2019) 30 tablet 0  . Magnesium 400 MG TABS Take 400 mg by mouth 2 (two) times daily.     . Omega-3 Fatty Acids (FISH OIL CONCENTRATE PO) Take 1 capsule by mouth daily.    . ondansetron (ZOFRAN) 8 MG tablet Take 1 tablet (8 mg total) by mouth 2 (two) times daily as needed for refractory nausea / vomiting. Start on day 3 after cyclophosphamide. (Patient not taking: Reported on 06/18/2019) 30 tablet 1  . oxymetazoline (AFRIN) 0.05 % nasal spray Place 1 spray into both nostrils at bedtime as needed for congestion.    . pantoprazole (PROTONIX) 40 MG tablet Take 1 tablet (40 mg total) by mouth daily. (Patient not taking: Reported on 06/18/2019) 30 tablet 1  . spironolactone (ALDACTONE) 25 MG tablet Take 0.5 tablets (12.5 mg total) by mouth daily. 15 tablet 3   No current facility-administered medications for this visit.    REVIEW OF SYSTEMS:   A 10+ POINT REVIEW OF SYSTEMS WAS OBTAINED including neurology, dermatology, psychiatry, cardiac, respiratory, lymph, extremities, GI, GU, Musculoskeletal, constitutional, breasts, reproductive, HEENT.  All pertinent positives are noted in the HPI.  All others are negative.   PHYSICAL EXAMINATION: ECOG PERFORMANCE STATUS: 1 - Symptomatic but completely ambulatory  Vitals:   12/18/19 1435  BP: 127/85  Pulse: (!) 56  Resp: 20  Temp: 98.1 F (36.7 C)    SpO2: 100%   Filed Weights   12/18/19 1435  Weight: 239 lb 6 oz (108.6 kg)   .Body mass index is 34.35 kg/m.   GENERAL:alert, in no acute distress and comfortable SKIN: no acute rashes, no significant lesions EYES: conjunctiva are pink and non-injected, sclera anicteric OROPHARYNX: MMM, no exudates, no oropharyngeal erythema or ulceration NECK: supple, no JVD LYMPH:  no palpable lymphadenopathy in the cervical, axillary or inguinal regions LUNGS: clear to auscultation b/l with normal respiratory effort HEART: regular rate & rhythm ABDOMEN:  normoactive bowel sounds , non tender, not distended. No palpable hepatosplenomegaly.  Extremity: no pedal edema PSYCH: alert & oriented x 3 with fluent speech NEURO: no focal motor/sensory deficits  LABORATORY DATA:  I have reviewed the data as listed  . CBC Latest Ref Rng & Units 12/18/2019 09/16/2019 06/18/2019  WBC 4.0 - 10.5 K/uL 6.3 4.9 5.7  Hemoglobin 13.0 - 17.0 g/dL 15.3 15.0 13.7  Hematocrit 39.0 - 52.0 % 43.3 43.4 40.4  Platelets 150 - 400 K/uL 238 249 272    . CMP Latest Ref Rng & Units 12/18/2019 09/16/2019 06/18/2019  Glucose 70 - 99 mg/dL 99 92 120(H)  BUN 8 - 23 mg/dL 19 18 13   Creatinine 0.61 - 1.24 mg/dL 1.06 1.01 1.05  Sodium 135 - 145 mmol/L 139 137 137  Potassium 3.5 - 5.1 mmol/L 4.4 4.1 4.3  Chloride 98 - 111 mmol/L 105 103 103  CO2 22 - 32 mmol/L 23 27 25   Calcium 8.9 - 10.3 mg/dL 9.6 9.5 9.4  Total Protein 6.5 - 8.1 g/dL 7.7 7.5 7.5  Total Bilirubin 0.3 - 1.2 mg/dL 0.7 0.5 0.4  Alkaline Phos 38 - 126 U/L 45 48 47  AST 15 - 41 U/L 15 19 19   ALT 0 - 44 U/L 18 19 18    . Lab Results  Component Value Date   LDH 155 12/18/2019     12/25/18 Left Retroperitoneal Biopsy:    RADIOGRAPHIC STUDIES: I have personally reviewed the radiological images as listed and agreed with the findings in the report. No results found.  ASSESSMENT & PLAN:  64 y.o. male with  1. Recenty diagnosed Diffuse Large B-Cell Lymphoma,  Stage III-A Labs upon initial presentation from 12/25/18, blood counts normal including WBC at 5.3k, HGB at 14.9 and PLT at 220k 12/25/18 Left retroperitoneal biopsy revealed Diffuse Large B-Cell Lymphoma 12/19/18 PET/CT revealed "Hypermetabolic adenopathy within the chest, abdomen, and upper pelvis, most consistent with a lymphoproliferative process. 2. Aortic Atherosclerosis." 01/01/19 Hep B and Hep C negative  01/04/19 ECHO revealed LV EF of 40-45%, moderately increased left ventricular wall thickness, impaired relaxation, and moderate hypokinesis of LV, entire inferior wall and apical segment. Discussed that the general treatment recommendation is up to 6 cycles of R-CHOP. However, given his cardiac considerations I would recommend substituting Etoposide for Doxorubicin for R-CEOP regimen to limit risk of worsening heart failure. Discussed that this choice can decrease treatment efficacy but would aim to preserve heart function. Pt strongly prefers outpatient treatment and to avoid anthracycline in any form, which I also recommend.  04/09/2019 PET/CT Whole Body Scan ([id number]) which revealed "1. Essentially complete metabolic response. No new or residual hypermetabolic lymphadenopathy in the chest, abdomen or pelvis. Deauville score 2.  Aortic Atherosclerosis (ICD10-I70.0)." 06/20/2019 PET/CT ([id number]) revealed "1. No evidence of active lymphoma on skull base to thigh FDG PET scan."  PLAN: -Discussed pt labwork today, 12/18/19; blood counts  and chemistries are nml, LDH is WNL -No lab or clinical evidence of DLBCL recurrence at this time.  -Pt is currently 9 months out from treatment. No indication to begin treatment at this time.  -If labs continue to be stable, may push next f/u out to 4 months -Will see back in 3 months with labs    FOLLOW UP: RTC with dr Irene Limbo with labs in 3 months  The total time spent in the appt was 20 minutes and more than 50% was on counseling and direct patient  cares.  All of the patient's questions were answered with apparent satisfaction. The patient knows to call the clinic with any problems, questions or concerns.   Sullivan Lone MD [place] AAHIVMS [facility] Hematology/Oncology Physician [facility]  (Office):       [phone] (Work cell):  [phone] (Fax):           [phone]  12/18/2019 2:47 PM  I, Yevette Edwards, am acting as a scribe for Dr. Sullivan Lone.   .I have reviewed the above documentation for accuracy and completeness, and I agree with the above. Brunetta Genera MD

## 2019-12-25 ENCOUNTER — Telehealth: Payer: Self-pay | Admitting: Hematology

## 2019-12-25 NOTE — Telephone Encounter (Signed)
Scheduled per los, patient has been called and voicemail was left. 

## 2019-12-28 ENCOUNTER — Other Ambulatory Visit (HOSPITAL_COMMUNITY): Payer: Self-pay | Admitting: Cardiology

## 2019-12-30 ENCOUNTER — Other Ambulatory Visit (HOSPITAL_COMMUNITY): Payer: Self-pay | Admitting: *Deleted

## 2019-12-30 MED ORDER — LISINOPRIL 5 MG PO TABS: 5.0000 mg | ORAL_TABLET | Freq: Two times a day (BID) | ORAL | 3 refills | Status: AC

## 2020-01-20 IMAGING — PT NM PET TUM IMG RESTAG (PS) SKULL BASE T - THIGH
1 of 8 series · 1 of 25 positions shown · non-contrast
Comparison: 12/19/2018 PET-CT.

CLINICAL DATA: Subsequent treatment strategy for diffuse large
B-cell lymphoma status post 3 cycles chemoimmunotherapy.

EXAM:
NUCLEAR MEDICINE PET SKULL BASE TO THIGH
TECHNIQUE: 10.5 mCi F-18 FDG was injected intravenously. Full-ring PET imaging
was performed from the skull base to thigh after the radiotracer. CT
data was obtained and used for attenuation correction and anatomic
localization.
Fasting blood glucose: 110 mg/dl

[Series 4: ct sk_thigh 5.0 hd_fov · axial · 5.0mm · 1.17mm/px · 1 of 235 slices shown]
[im 118/235  soft-tissue]
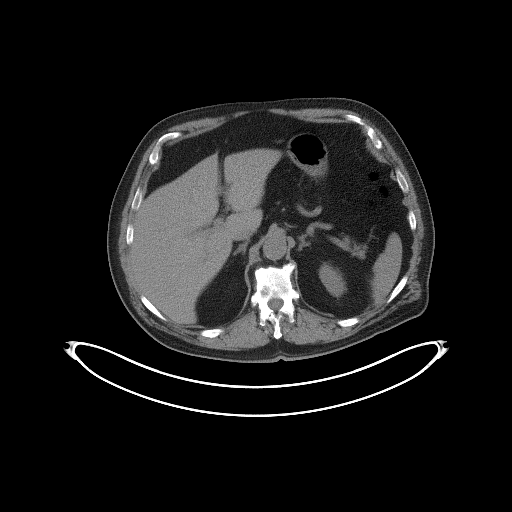

[1 of 25 positions shown; findings below may reference images not displayed]

FINDINGS: Mediastinal blood pool activity: SUV max

Liver activity: SUV max

NECK: No hypermetabolic lymph nodes in the neck.

Incidental CT findings: none

CHEST:

No new or residual enlarged or hypermetabolic lymph nodes in the
mediastinum. Previously visualized bilateral paratracheal
hypermetabolic lymphadenopathy has resolved.

No hypermetabolic pulmonary findings.

Incidental CT findings: Coronary atherosclerosis status post CABG.
Aortic valve prosthesis in place. Right internal jugular Port-A-Cath
terminates at the cavoatrial junction. Atherosclerotic nonaneurysmal
thoracic aorta. No acute consolidative airspace disease, lung masses
or significant pulmonary nodules. Intact appearing sternotomy wires.

ABDOMEN/PELVIS:

No new or residual hypermetabolic lymph nodes in the abdomen or
pelvis. No residual hypermetabolism within the previously noted
hypermetabolic gastrohepatic ligament, porta hepatis, aortocaval,
left periaortic or common iliac lymph nodes, which have all
decreased in size. Representative 0.9 cm aortocaval node with max
SUV 2.6 (series 4/image 135), previously 2.5 cm with max SUV 27.5.
No measurable uptake within the otherwise subcentimeter
retroperitoneal lymph nodes.

No abnormal hypermetabolic activity within the liver, pancreas,
adrenal glands, or spleen.

Incidental CT findings: Atherosclerotic nonaneurysmal abdominal
aorta. Moderate left colonic diverticulosis. Mildly enlarged
prostate.

SKELETON: No focal hypermetabolic activity to suggest skeletal
metastasis. New diffuse hypermetabolism throughout the marrow of
axial and proximal appendicular skeleton.

Incidental CT findings: none
IMPRESSION: 1. Essentially complete metabolic response. No new or residual
hypermetabolic lymphadenopathy in the chest, abdomen or pelvis.
[HOSPITAL] score 2.
2.  Aortic Atherosclerosis ([id number]).

## 2020-03-17 ENCOUNTER — Inpatient Hospital Stay: Payer: Self-pay

## 2020-03-17 ENCOUNTER — Inpatient Hospital Stay: Payer: Self-pay | Admitting: Hematology

## 2020-03-17 ENCOUNTER — Telehealth: Payer: Self-pay | Admitting: Hematology

## 2020-03-17 NOTE — Telephone Encounter (Signed)
Called pt per 9/07 sch msg - left message for patient to call back to reschedule.

## 2020-03-26 ENCOUNTER — Other Ambulatory Visit (HOSPITAL_COMMUNITY): Payer: Self-pay | Admitting: Cardiology

## 2020-05-01 ENCOUNTER — Other Ambulatory Visit (HOSPITAL_COMMUNITY): Payer: Self-pay | Admitting: Internal Medicine

## 2020-05-05 ENCOUNTER — Telehealth (HOSPITAL_COMMUNITY): Payer: Self-pay | Admitting: Pharmacist

## 2020-05-05 MED ORDER — CARVEDILOL 6.25 MG PO TABS: 6.2500 mg | ORAL_TABLET | Freq: Two times a day (BID) | ORAL | 11 refills | Status: DC

## 2020-05-05 NOTE — Telephone Encounter (Signed)
Carvedilol prescription sent to [facility].

## 2020-05-13 ENCOUNTER — Other Ambulatory Visit (HOSPITAL_COMMUNITY): Payer: Self-pay | Admitting: Internal Medicine

## 2020-07-31 ENCOUNTER — Other Ambulatory Visit (HOSPITAL_COMMUNITY): Payer: Self-pay

## 2020-07-31 ENCOUNTER — Other Ambulatory Visit (HOSPITAL_COMMUNITY): Payer: Self-pay | Admitting: Cardiology

## 2020-07-31 MED ORDER — SPIRONOLACTONE 25 MG PO TABS: ORAL_TABLET | ORAL | 0 refills | Status: DC

## 2020-08-31 ENCOUNTER — Other Ambulatory Visit (HOSPITAL_COMMUNITY): Payer: Self-pay | Admitting: Cardiology

## 2020-09-28 ENCOUNTER — Other Ambulatory Visit (HOSPITAL_COMMUNITY): Payer: Self-pay | Admitting: Cardiology

## 2020-11-02 ENCOUNTER — Other Ambulatory Visit (HOSPITAL_COMMUNITY): Payer: Self-pay | Admitting: Cardiology

## 2020-11-29 ENCOUNTER — Other Ambulatory Visit (HOSPITAL_COMMUNITY): Payer: Self-pay | Admitting: Cardiology

## 2020-12-28 ENCOUNTER — Other Ambulatory Visit (HOSPITAL_COMMUNITY): Payer: Self-pay | Admitting: Cardiology

## 2021-04-26 ENCOUNTER — Other Ambulatory Visit (HOSPITAL_COMMUNITY): Payer: Self-pay | Admitting: Cardiology

## 2021-12-15 ENCOUNTER — Other Ambulatory Visit: Payer: Self-pay | Admitting: Oncology

## 2023-05-31 NOTE — Telephone Encounter (Signed)
Telephone call
